# Patient Record
Sex: Female | Born: 1935 | Race: White | Hispanic: No | Smoking: Never smoker
Health system: Southern US, Community
[De-identification: ages and names within clinical notes are randomized; demographics above are authoritative.]

## PROBLEM LIST (undated history)

## (undated) DIAGNOSIS — C44300 Unspecified malignant neoplasm of skin of unspecified part of face: Secondary | ICD-10-CM

## (undated) DIAGNOSIS — K219 Gastro-esophageal reflux disease without esophagitis: Secondary | ICD-10-CM

## (undated) DIAGNOSIS — R350 Frequency of micturition: Secondary | ICD-10-CM

## (undated) DIAGNOSIS — M1990 Unspecified osteoarthritis, unspecified site: Secondary | ICD-10-CM

## (undated) DIAGNOSIS — E7800 Pure hypercholesterolemia, unspecified: Secondary | ICD-10-CM

## (undated) DIAGNOSIS — K5790 Diverticulosis of intestine, part unspecified, without perforation or abscess without bleeding: Secondary | ICD-10-CM

## (undated) DIAGNOSIS — Z5189 Encounter for other specified aftercare: Secondary | ICD-10-CM

## (undated) DIAGNOSIS — I10 Essential (primary) hypertension: Secondary | ICD-10-CM

## (undated) DIAGNOSIS — I4891 Unspecified atrial fibrillation: Principal | ICD-10-CM

## (undated) DIAGNOSIS — IMO0001 Reserved for inherently not codable concepts without codable children: Secondary | ICD-10-CM

## (undated) DIAGNOSIS — S4290XA Fracture of unspecified shoulder girdle, part unspecified, initial encounter for closed fracture: Secondary | ICD-10-CM

## (undated) DIAGNOSIS — R55 Syncope and collapse: Secondary | ICD-10-CM

## (undated) DIAGNOSIS — Z950 Presence of cardiac pacemaker: Secondary | ICD-10-CM

## (undated) DIAGNOSIS — I82409 Acute embolism and thrombosis of unspecified deep veins of unspecified lower extremity: Secondary | ICD-10-CM

## (undated) DIAGNOSIS — N3281 Overactive bladder: Secondary | ICD-10-CM

## (undated) DIAGNOSIS — N189 Chronic kidney disease, unspecified: Secondary | ICD-10-CM

## (undated) HISTORY — PX: JOINT REPLACEMENT: SHX530

## (undated) HISTORY — PX: CATARACT EXTRACTION W/ INTRAOCULAR LENS  IMPLANT, BILATERAL: SHX1307

## (undated) HISTORY — PX: TOTAL HIP ARTHROPLASTY: SHX124

## (undated) HISTORY — DX: Presence of cardiac pacemaker: Z95.0

## (undated) HISTORY — PX: APPENDECTOMY: SHX54

---

## 1983-02-04 HISTORY — PX: BLADDER SUSPENSION: SHX72

## 1984-02-04 HISTORY — PX: HERNIA REPAIR: SHX51

## 2010-02-03 HISTORY — PX: SKIN CANCER EXCISION: SHX779

## 2010-07-11 ENCOUNTER — Other Ambulatory Visit: Payer: Self-pay | Admitting: Geriatric Medicine

## 2010-07-15 ENCOUNTER — Ambulatory Visit
Admission: RE | Admit: 2010-07-15 | Discharge: 2010-07-15 | Disposition: A | Discharge status: Home / Self Care | Payer: Medicare Other | Source: Ambulatory Visit | Attending: Geriatric Medicine | Admitting: Geriatric Medicine

## 2011-02-06 DIAGNOSIS — N183 Chronic kidney disease, stage 3 unspecified: Secondary | ICD-10-CM | POA: Diagnosis not present

## 2011-02-06 DIAGNOSIS — Z0000 Encounter for general adult medical examination without abnormal findings: Secondary | ICD-10-CM | POA: Diagnosis not present

## 2011-02-06 DIAGNOSIS — R079 Chest pain, unspecified: Secondary | ICD-10-CM | POA: Diagnosis not present

## 2011-02-06 DIAGNOSIS — Z79899 Other long term (current) drug therapy: Secondary | ICD-10-CM | POA: Diagnosis not present

## 2011-02-06 DIAGNOSIS — K219 Gastro-esophageal reflux disease without esophagitis: Secondary | ICD-10-CM | POA: Diagnosis not present

## 2011-02-06 DIAGNOSIS — M25549 Pain in joints of unspecified hand: Secondary | ICD-10-CM | POA: Diagnosis not present

## 2011-02-06 DIAGNOSIS — I129 Hypertensive chronic kidney disease with stage 1 through stage 4 chronic kidney disease, or unspecified chronic kidney disease: Secondary | ICD-10-CM | POA: Diagnosis not present

## 2011-02-28 DIAGNOSIS — G5600 Carpal tunnel syndrome, unspecified upper limb: Secondary | ICD-10-CM | POA: Diagnosis not present

## 2011-02-28 DIAGNOSIS — G5620 Lesion of ulnar nerve, unspecified upper limb: Secondary | ICD-10-CM | POA: Diagnosis not present

## 2011-03-11 DIAGNOSIS — B351 Tinea unguium: Secondary | ICD-10-CM | POA: Diagnosis not present

## 2011-03-11 DIAGNOSIS — M79609 Pain in unspecified limb: Secondary | ICD-10-CM | POA: Diagnosis not present

## 2011-03-18 DIAGNOSIS — N183 Chronic kidney disease, stage 3 unspecified: Secondary | ICD-10-CM | POA: Diagnosis not present

## 2011-03-18 DIAGNOSIS — I129 Hypertensive chronic kidney disease with stage 1 through stage 4 chronic kidney disease, or unspecified chronic kidney disease: Secondary | ICD-10-CM | POA: Diagnosis not present

## 2011-03-18 DIAGNOSIS — G5600 Carpal tunnel syndrome, unspecified upper limb: Secondary | ICD-10-CM | POA: Diagnosis not present

## 2011-03-26 DIAGNOSIS — G5600 Carpal tunnel syndrome, unspecified upper limb: Secondary | ICD-10-CM | POA: Diagnosis not present

## 2011-04-10 ENCOUNTER — Other Ambulatory Visit: Payer: Self-pay | Admitting: Orthopedic Surgery

## 2011-04-15 ENCOUNTER — Encounter (HOSPITAL_BASED_OUTPATIENT_CLINIC_OR_DEPARTMENT_OTHER): Payer: Self-pay | Admitting: *Deleted

## 2011-04-15 NOTE — Progress Notes (Signed)
Pt lives [facility] [place]-does not drive. She takes care of her meds. Son to bring her dos and stay with her post op-along with granddaughter. Will need istat-ekg

## 2011-04-17 NOTE — H&P (Signed)
Julie Mcpherson is an 78 y.o. female.   Chief Complaint: c/o progressive numbness and tingling bilat. hands HPI: Julie Mcpherson is a remarkably intact 78 year-old woman.  She has an excellent sense of humor.  She presents for evaluation of bilateral hand numbness.  She had detailed electrodiagnostic studies  performed by Dr. Brien Few on February 28, 2011.  These reveal evidence of very severe bilateral carpal tunnel syndrome.    Past Medical History  Diagnosis Date  . Hypertension   . GERD (gastroesophageal reflux disease)   . Arthritis   . Urinary frequency   . Diverticular disease   . Chronic kidney disease     stage 3 renal disease    Past Surgical History  Procedure Date  . Joint replacement     both total hips=rt x2  . Hernia repair     1986  . Appendectomy   . Bladder suspension 1985    History reviewed. No pertinent family history. Social History:  reports that she has never smoked. She does not have any smokeless tobacco history on file. She reports that she does not drink alcohol or use illicit drugs.  Allergies:  Allergies  Allergen Reactions  . Codeine   . Statins     headaches  . Sulfa Antibiotics   . Tetanus Toxoids     No current facility-administered medications on file as of .   Medications Prior to Admission  Medication Sig Dispense Refill  . acetaminophen (TYLENOL) 500 MG chewable tablet Chew 500 mg by mouth every 6 (six) hours as needed.      [name] aspirin 81 MG tablet Take 81 mg by mouth daily.      . calcium carbonate (OS-CAL) 600 MG TABS Take 600 mg by mouth 2 (two) times daily with a meal.      . cholecalciferol (VITAMIN D) 1000 UNITS tablet Take 1,000 Units by mouth daily.      . fish oil-omega-3 fatty acids 1000 MG capsule Take 2 g by mouth daily.      . metoprolol tartrate (LOPRESSOR) 25 MG tablet Take 25 mg by mouth 1 day or 1 dose.      [name] omeprazole (PRILOSEC) 40 MG capsule Take 40 mg by mouth daily.      [name] oxybutynin (DITROPAN XL) 15 MG 24 hr tablet  Take 15 mg by mouth daily.        No results found for this or any previous visit (from the past 48 hour(s)).  No results found.   Pertinent items are noted in HPI.  Height 5\' 1"  (1.549 m), weight 72.576 kg (160 lb).  General appearance: alert Head: Normocephalic, without obvious abnormality Neck: supple, symmetrical, trachea midline Resp: clear to auscultation bilaterally Cardio: regular rate and rhythm, S1, S2 normal, no murmur, click, rub or gallop GI: normal findings: bowel sounds normal Extremities:.  Inspection of her hands reveals significant bilateral thenar atrophy.   She has range of motion in her fingers in flexion/extension, no sign of stenosing tenosynovitis. Pulse and cap refill are intact.  She has significant stigmata of osteoarthritis including Heberden's and Bouchard's nodes as well as shouldering of her thumb CMC joints bilaterally.  She has mild angular deformities of multiple interphalangeal joints consistent with marked osteoarthritis.    She has limitation of her shoulder motion bilaterally. She had fracture of her left shoulder approximately ten years prior and fracture of the right shoulder six years prior.     X-rays of her hands and wrist reveal  diffuse osteoarthritis affecting all of her interphalangeal joints, metacarpophalangeal joints and her thumb CMC joints.  Her carpus is well preserved bilaterally.  Dr. Lauris Poag electrodiagnostic studies are reviewed in detail. She has severe bilateral carpal tunnel syndrome with unrecordable sensory latencies bilaterally. Her ulnar nerve conduction parameters were delayed across the elbows with absent ulnar sensory nerve action potentials bilaterally.    We had a detailed discussion regarding her predicament.  She does not appear to have a clinically significant ulnar impairment despite her findings on electrodiagnostic studies.   Pulses: 2+ and symmetric Skin: Skin color, texture, turgor normal. No rashes or lesions or  normal Neurologic: Grossly normal Incision/Wound:   Assessment/Plan Impression:: Right CTS  Plan: To the OR for Right CTR. The procedure , risks and post-op course were discussed with the patient at length and she was in agreement with the plan.    DASNOIT,[name] J 04/17/2011, 1:29 PM   H&P documentation: 04/18/2011  -History and Physical Reviewed  -Patient has been re-examined  -No change in the plan of care  Cammie Sickle, MD

## 2011-04-18 ENCOUNTER — Ambulatory Visit (HOSPITAL_BASED_OUTPATIENT_CLINIC_OR_DEPARTMENT_OTHER)
Admission: RE | Admit: 2011-04-18 | Discharge: 2011-04-18 | Disposition: A | Discharge status: Home / Self Care | Payer: Medicare Other | Source: Ambulatory Visit | Attending: Orthopedic Surgery | Admitting: Orthopedic Surgery

## 2011-04-18 ENCOUNTER — Encounter (HOSPITAL_BASED_OUTPATIENT_CLINIC_OR_DEPARTMENT_OTHER)
Admission: RE | Disposition: A | Discharge status: Home / Self Care | Payer: Self-pay | Source: Ambulatory Visit | Attending: Orthopedic Surgery

## 2011-04-18 ENCOUNTER — Encounter (HOSPITAL_BASED_OUTPATIENT_CLINIC_OR_DEPARTMENT_OTHER): Payer: Self-pay | Admitting: *Deleted

## 2011-04-18 ENCOUNTER — Encounter (HOSPITAL_BASED_OUTPATIENT_CLINIC_OR_DEPARTMENT_OTHER): Payer: Self-pay | Admitting: Certified Registered Nurse Anesthetist

## 2011-04-18 ENCOUNTER — Other Ambulatory Visit: Payer: Self-pay

## 2011-04-18 ENCOUNTER — Ambulatory Visit (HOSPITAL_BASED_OUTPATIENT_CLINIC_OR_DEPARTMENT_OTHER): Payer: Medicare Other | Admitting: Certified Registered Nurse Anesthetist

## 2011-04-18 DIAGNOSIS — G5600 Carpal tunnel syndrome, unspecified upper limb: Secondary | ICD-10-CM | POA: Insufficient documentation

## 2011-04-18 DIAGNOSIS — I129 Hypertensive chronic kidney disease with stage 1 through stage 4 chronic kidney disease, or unspecified chronic kidney disease: Secondary | ICD-10-CM | POA: Diagnosis not present

## 2011-04-18 DIAGNOSIS — K219 Gastro-esophageal reflux disease without esophagitis: Secondary | ICD-10-CM | POA: Diagnosis not present

## 2011-04-18 DIAGNOSIS — N183 Chronic kidney disease, stage 3 unspecified: Secondary | ICD-10-CM | POA: Insufficient documentation

## 2011-04-18 HISTORY — DX: Chronic kidney disease, unspecified: N18.9

## 2011-04-18 HISTORY — PX: CARPAL TUNNEL RELEASE: SHX101

## 2011-04-18 HISTORY — DX: Frequency of micturition: R35.0

## 2011-04-18 HISTORY — DX: Essential (primary) hypertension: I10

## 2011-04-18 HISTORY — DX: Unspecified osteoarthritis, unspecified site: M19.90

## 2011-04-18 HISTORY — DX: Gastro-esophageal reflux disease without esophagitis: K21.9

## 2011-04-18 HISTORY — DX: Diverticulosis of intestine, part unspecified, without perforation or abscess without bleeding: K57.90

## 2011-04-18 LAB — POCT I-STAT, CHEM 8
BUN: 35 mg/dL — ABNORMAL HIGH (ref 6–23)
Calcium, Ion: 1.08 mmol/L — ABNORMAL LOW (ref 1.12–1.32)
Chloride: 111 mEq/L (ref 96–112)
Creatinine, Ser: 1.3 mg/dL — ABNORMAL HIGH (ref 0.50–1.10)
Glucose, Bld: 90 mg/dL (ref 70–99)
HCT: 36 % (ref 36.0–46.0)
Hemoglobin: 12.2 g/dL (ref 12.0–15.0)
Potassium: 4.6 mEq/L (ref 3.5–5.1)
Sodium: 139 mEq/L (ref 135–145)
TCO2: 20 mmol/L (ref 0–100)

## 2011-04-18 SURGERY — CARPAL TUNNEL RELEASE
Anesthesia: General | Site: Hand | Laterality: Right | Wound class: Clean

## 2011-04-18 MED ORDER — FENTANYL CITRATE 0.05 MG/ML IJ SOLN
INTRAMUSCULAR | Status: DC | PRN
  Administered 2011-04-18: 25 ug via INTRAVENOUS

## 2011-04-18 MED ORDER — LIDOCAINE HCL 2 % IJ SOLN
INTRAMUSCULAR | Status: DC | PRN
  Administered 2011-04-18: 4 mL

## 2011-04-18 MED ORDER — MORPHINE SULFATE 2 MG/ML IJ SOLN: 1.0000 mg | INTRAMUSCULAR | Status: DC | PRN

## 2011-04-18 MED ORDER — LACTATED RINGERS IV SOLN
INTRAVENOUS | Status: DC
Start: 2011-04-18 — End: 2011-04-18
  Administered 2011-04-18 (×2): via INTRAVENOUS

## 2011-04-18 MED ORDER — FENTANYL CITRATE 0.05 MG/ML IJ SOLN: 50.0000 ug | INTRAMUSCULAR | Status: DC | PRN

## 2011-04-18 MED ORDER — CHLORHEXIDINE GLUCONATE 4 % EX LIQD: 60.0000 mL | Freq: Once | CUTANEOUS | Status: DC

## 2011-04-18 MED ORDER — ONDANSETRON HCL 4 MG/2ML IJ SOLN
INTRAMUSCULAR | Status: DC | PRN
  Administered 2011-04-18: 4 mg via INTRAVENOUS

## 2011-04-18 MED ORDER — MIDAZOLAM HCL 2 MG/2ML IJ SOLN: 1.0000 mg | INTRAMUSCULAR | Status: DC | PRN

## 2011-04-18 MED ORDER — LIDOCAINE HCL (CARDIAC) 20 MG/ML IV SOLN
INTRAVENOUS | Status: DC | PRN
  Administered 2011-04-18: 40 mg via INTRAVENOUS

## 2011-04-18 MED ORDER — DEXAMETHASONE SODIUM PHOSPHATE 10 MG/ML IJ SOLN
INTRAMUSCULAR | Status: DC | PRN
  Administered 2011-04-18: 4 mg via INTRAVENOUS

## 2011-04-18 MED ORDER — PROPOFOL 10 MG/ML IV EMUL
INTRAVENOUS | Status: DC | PRN
  Administered 2011-04-18: 80 mg via INTRAVENOUS

## 2011-04-18 MED ORDER — TRAMADOL HCL 50 MG PO TABS: ORAL_TABLET | ORAL | Status: AC

## 2011-04-18 SURGICAL SUPPLY — 37 items
BANDAGE ADHESIVE 1X3 (GAUZE/BANDAGES/DRESSINGS) IMPLANT
BANDAGE ELASTIC 3 VELCRO ST LF (GAUZE/BANDAGES/DRESSINGS) ×2 IMPLANT
BLADE SURG 15 STRL LF DISP TIS (BLADE) ×1 IMPLANT
BLADE SURG 15 STRL SS (BLADE) ×2
BNDG CMPR 9X4 STRL LF SNTH (GAUZE/BANDAGES/DRESSINGS) ×1
BNDG ESMARK 4X9 LF (GAUZE/BANDAGES/DRESSINGS) ×1 IMPLANT
BRUSH SCRUB EZ PLAIN DRY (MISCELLANEOUS) ×2 IMPLANT
CLOTH BEACON ORANGE TIMEOUT ST (SAFETY) ×2 IMPLANT
CORDS BIPOLAR (ELECTRODE) ×1 IMPLANT
COVER MAYO STAND STRL (DRAPES) ×2 IMPLANT
COVER TABLE BACK 60X90 (DRAPES) ×2 IMPLANT
CUFF TOURNIQUET SINGLE 18IN (TOURNIQUET CUFF) ×1 IMPLANT
DECANTER SPIKE VIAL GLASS SM (MISCELLANEOUS) IMPLANT
DRAPE EXTREMITY T 121X128X90 (DRAPE) ×2 IMPLANT
DRAPE SURG 17X23 STRL (DRAPES) ×2 IMPLANT
GLOVE BIO SURGEON STRL SZ 6.5 (GLOVE) ×1 IMPLANT
GLOVE BIOGEL M STRL SZ7.5 (GLOVE) ×2 IMPLANT
GLOVE ORTHO TXT STRL SZ7.5 (GLOVE) ×2 IMPLANT
GOWN PREVENTION PLUS XLARGE (GOWN DISPOSABLE) ×3 IMPLANT
GOWN PREVENTION PLUS XXLARGE (GOWN DISPOSABLE) ×4 IMPLANT
NEEDLE 27GAX1X1/2 (NEEDLE) ×1 IMPLANT
PACK BASIN DAY SURGERY FS (CUSTOM PROCEDURE TRAY) ×2 IMPLANT
PAD CAST 3X4 CTTN HI CHSV (CAST SUPPLIES) ×1 IMPLANT
PADDING CAST ABS 4INX4YD NS (CAST SUPPLIES)
PADDING CAST ABS COTTON 4X4 ST (CAST SUPPLIES) ×1 IMPLANT
PADDING CAST COTTON 3X4 STRL (CAST SUPPLIES) ×2
SPLINT PLASTER CAST XFAST 3X15 (CAST SUPPLIES) ×5 IMPLANT
SPLINT PLASTER XTRA FASTSET 3X (CAST SUPPLIES) ×5
SPONGE GAUZE 4X4 12PLY (GAUZE/BANDAGES/DRESSINGS) ×2 IMPLANT
STOCKINETTE 4X48 STRL (DRAPES) ×2 IMPLANT
STRIP CLOSURE SKIN 1/2X4 (GAUZE/BANDAGES/DRESSINGS) ×2 IMPLANT
SUT PROLENE 3 0 PS 2 (SUTURE) ×2 IMPLANT
SYR 3ML 23GX1 SAFETY (SYRINGE) IMPLANT
SYR CONTROL 10ML LL (SYRINGE) ×1 IMPLANT
TRAY DSU PREP LF (CUSTOM PROCEDURE TRAY) ×2 IMPLANT
UNDERPAD 30X30 INCONTINENT (UNDERPADS AND DIAPERS) ×2 IMPLANT
WATER STERILE IRR 1000ML POUR (IV SOLUTION) ×2 IMPLANT

## 2011-04-18 NOTE — Brief Op Note (Signed)
04/18/2011  [id number] AM  PATIENT:  [place] y.o. female  PRE-OPERATIVE DIAGNOSIS:  Right Carpal Tunnel Syndrome  POST-OPERATIVE DIAGNOSIS:  Right Carpal Tunnel Syndrome  PROCEDURE: CARPAL TUNNEL RELEASE (Right)  SURGEON: Cammie Sickle., MD   PHYSICIAN ASSISTANT:   ASSISTANT: Kathyrn Sheriff.A-C    ANESTHESIA:   general  EBL:     BLOOD ADMINISTERED:none  DRAINS: none   LOCAL MEDICATIONS USED:  LIDOCAINE   SPECIMEN:  No Specimen  DISPOSITION OF SPECIMEN:  N/A  COUNTS:  YES  TOURNIQUET:   Total Tourniquet Time Documented: Upper Arm (Right) - 9 minutes  DICTATION: .Other Dictation: Dictation Number [id number]  PLAN OF CARE: Discharge to home after PACU  PATIENT DISPOSITION:  PACU - hemodynamically stable.

## 2011-04-18 NOTE — Anesthesia Preprocedure Evaluation (Signed)
Anesthesia Evaluation  Patient identified by MRN, date of birth, ID band Patient awake    Reviewed: Allergy & Precautions, H&P , NPO status , Patient's Chart, lab work & pertinent test results  Airway Mallampati: II TM Distance: >3 FB Neck ROM: Full    Dental   Pulmonary    Pulmonary exam normal       Cardiovascular     Neuro/Psych    GI/Hepatic GERD-  ,  Endo/Other    Renal/GU      Musculoskeletal   Abdominal   Peds  Hematology   Anesthesia Other Findings   Reproductive/Obstetrics                           Anesthesia Physical Anesthesia Plan  ASA: II  Anesthesia Plan: General   Post-op Pain Management:    Induction: Intravenous  Airway Management Planned: LMA  Additional Equipment:   Intra-op Plan:   Post-operative Plan: Extubation in OR  Informed Consent: I have reviewed the patients History and Physical, chart, labs and discussed the procedure including the risks, benefits and alternatives for the proposed anesthesia with the patient or authorized representative who has indicated his/her understanding and acceptance.     Plan Discussed with: CRNA and Surgeon  Anesthesia Plan Comments:         Anesthesia Quick Evaluation

## 2011-04-18 NOTE — Anesthesia Procedure Notes (Signed)
Procedure Name: LMA Insertion Date/Time: 04/18/2011 10:10 AM Performed by: Linde Wilensky D Pre-anesthesia Checklist: Patient identified, Emergency Drugs available, Suction available and Patient being monitored Patient Re-evaluated:Patient Re-evaluated prior to inductionOxygen Delivery Method: Circle System Utilized Preoxygenation: Pre-oxygenation with 100% oxygen Intubation Type: IV induction Ventilation: Mask ventilation without difficulty LMA: LMA inserted LMA Size: 4.0 Number of attempts: 1 Placement Confirmation: positive ETCO2 Tube secured with: Tape Dental Injury: Teeth and Oropharynx as per pre-operative assessment

## 2011-04-18 NOTE — Op Note (Signed)
Op note dictated 04/18/11  [id number]

## 2011-04-18 NOTE — Op Note (Signed)
NAME:  Julie Mcpherson, Julie Mcpherson.                ACCOUNT NO.:  MEDICAL RECORD NO.:  [id number]  LOCATION:                                 FACILITY:  PHYSICIAN:  Youlanda Mighty. Devona Holmes, M.D.      DATE OF BIRTH:  DATE OF PROCEDURE:  04/18/2011 DATE OF DISCHARGE:                              OPERATIVE REPORT   PREOPERATIVE DIAGNOSIS:  Chronic severe right carpal tunnel syndrome.  POSTOPERATIVE DIAGNOSIS:  Chronic severe right carpal tunnel syndrome.  OPERATIONS:  Release of right transverse carpal ligament.  OPERATING SURGEON:  Youlanda Mighty. Chablis Losh, M.D.  ASSISTANT:  Julian Reil, P.A.  ANESTHESIA:  General by LMA.  SUPERVISING ANESTHESIOLOGIST:  Leda Quail, M.D.  INDICATIONS:  Julie Mcpherson is a 78 year old woman referred through the courtesy of Dr. Lajean Manes.  She is in excellent condition.  She has had bilateral hand numbness.  She is referred to see Dr. Henrietta Hoover for electrodiagnostic studies which revealed bilateral carpal tunnel syndrome to a very significant degree.  She was subsequently referred for Hand Surgery consult by Dr. Felipa Eth.  Clinical examination confirmed thenar atrophy in bilateral carpal tunnel syndrome, right worse than left.  We advised to proceed with release of right Transverse carpal ligament.  Surgery and aftercare were described in detail.  She understands she will need to wait up to 5 months to see the full benefit of the procedure.  After informed consent, she was brought to the operating room at this time.  PROCEDURE:  Imani Kissell was brought to room #1 of the [facility] and placed supine position on the operating table.  Following induction of general anesthesia by LMA technique under Dr. Osborne Oman direct supervision, appropriate monitoring was placed.  Right arm was prepped with Betadine soap and solution, sterilely draped.  A pneumatic tourniquet was applied to proximal right brachium.  Following exsanguination of right arm with  Esmarch bandage, arterial tourniquet was inflated to 250 mmHg due to mild systolic hypertension.  Procedure commenced, routine surgical time-out.  Thereafter, a 2-cm incision was fashioned in line of the ring finger to palm.  Subcu tissues were carefully divided over the palmar fascia.  This split longitudinally to reveal the common sensory branch of median nerve and superficial palmar arch.  The canal was sounded with a Penfield 4 elevator followed by use of scissors to release the transverse carpal ligament of the hook at the hamate along its ulnar border.  The volar forearm fascia was released, widely opened the carpal canal and ulnar bursa.  The wound was then inspected for bleeding points which were electrocauterized bipolar current followed by repair of the skin with intradermal 3-0 Prolene suture.  Steri-Strips applied followed by sterile gauze, sterile Webril, and a volar plaster splint.  No apparent complications.  For aftercare, Ms Minotti is provided a prescription for Ultram 50 mg 1 p.o. q.4-6 hours p.r.n. pain 20 tabs without refill.  We will see her back for followup in our office in 7 days or sooner if any problems.     Youlanda Mighty Nasser Ku, M.D.     RVS/MEDQ  D:  04/18/2011  T:  04/18/2011  Job:  [id number]

## 2011-04-18 NOTE — Transfer of Care (Signed)
Immediate Anesthesia Transfer of Care Note  Patient: Julie Mcpherson  Procedure(s) Performed: Procedure(s) (LRB): CARPAL TUNNEL RELEASE (Right)  Patient Location: PACU  Anesthesia Type: General  Level of Consciousness: awake, alert , oriented and patient cooperative  Airway & Oxygen Therapy: Patient Spontanous Breathing and Patient connected to face mask oxygen  Post-op Assessment: Report given to PACU RN and Post -op Vital signs reviewed and stable  Post vital signs: Reviewed and stable  Complications: No apparent anesthesia complications

## 2011-04-18 NOTE — Anesthesia Postprocedure Evaluation (Signed)
  Anesthesia Post-op Note  Patient: Julie Mcpherson  Procedure(s) Performed: Procedure(s) (LRB): CARPAL TUNNEL RELEASE (Right)  Patient Location: PACU  Anesthesia Type: General  Level of Consciousness: awake  Airway and Oxygen Therapy: Patient Spontanous Breathing  Post-op Pain: mild  Post-op Assessment: Post-op Vital signs reviewed, Patient's Cardiovascular Status Stable, Respiratory Function Stable, Patent Airway and No signs of Nausea or vomiting  Post-op Vital Signs: stable  Complications: No apparent anesthesia complications

## 2011-04-18 NOTE — Discharge Instructions (Signed)
[facility] Instructions Hand Surgery  Wound Care: Keep your hand elevated above the level of your heart.  Do not allow it to dangle  by your side.  Keep the dressing dry and do not remove it unless your doctor advises you to do so.  He will usually change it at the time of your post-op visit.  Moving your fingers is advised to stimulate circulation but will depend on the site of your surgery.  If you have a splint applied, your doctor will advise you regarding movement.  Activity: Do not drive or operate machinery today.  Rest today and then you may return to your normal activity and work as indicated by your physician.  Diet:  Drink liquids today or eat a light diet.  You may resume a regular diet tomorrow.    General expectations: Pain for two to three days. Fingers may become slightly swollen.  Call your doctor if any of the following occur: Severe pain not relieved by pain medication. Elevated temperature. Dressing soaked with blood. Inability to move fingers. White or bluish color to fingers.[facility]  [address] [place], [place] [address] [phone]   Post Anesthesia Home Care Instructions  Activity: Get plenty of rest for the remainder of the day. A responsible adult should stay with you for 24 hours following the procedure.  For the next 24 hours, DO NOT: -Drive a car -Operate machinery -Drink alcoholic beverages -Take any medication unless instructed by your physician -Make any legal decisions or sign important papers.  Meals: Start with liquid foods such as gelatin or soup. Progress to regular foods as tolerated. Avoid greasy, spicy, heavy foods. If nausea and/or vomiting occur, drink only clear liquids until the nausea and/or vomiting subsides. Call your physician if vomiting continues.  Special Instructions/Symptoms: Your throat may feel dry or sore from the anesthesia or the breathing tube placed in your throat during surgery. If  this causes discomfort, gargle with warm salt water. The discomfort should disappear within 24 hours.   

## 2011-04-21 ENCOUNTER — Encounter (HOSPITAL_BASED_OUTPATIENT_CLINIC_OR_DEPARTMENT_OTHER): Payer: Self-pay | Admitting: Orthopedic Surgery

## 2011-05-03 ENCOUNTER — Emergency Department (HOSPITAL_COMMUNITY)
Admission: EM | Admit: 2011-05-03 | Discharge: 2011-05-03 | Disposition: A | Discharge status: Home / Self Care | Payer: Medicare Other | Source: Home / Self Care | Attending: Emergency Medicine | Admitting: Emergency Medicine

## 2011-05-03 ENCOUNTER — Encounter (HOSPITAL_COMMUNITY): Payer: Self-pay | Admitting: *Deleted

## 2011-05-03 ENCOUNTER — Other Ambulatory Visit: Payer: Self-pay

## 2011-05-03 DIAGNOSIS — Z7982 Long term (current) use of aspirin: Secondary | ICD-10-CM | POA: Insufficient documentation

## 2011-05-03 DIAGNOSIS — N183 Chronic kidney disease, stage 3 unspecified: Secondary | ICD-10-CM | POA: Insufficient documentation

## 2011-05-03 DIAGNOSIS — I129 Hypertensive chronic kidney disease with stage 1 through stage 4 chronic kidney disease, or unspecified chronic kidney disease: Secondary | ICD-10-CM | POA: Insufficient documentation

## 2011-05-03 DIAGNOSIS — R55 Syncope and collapse: Secondary | ICD-10-CM

## 2011-05-03 DIAGNOSIS — K219 Gastro-esophageal reflux disease without esophagitis: Secondary | ICD-10-CM | POA: Insufficient documentation

## 2011-05-03 DIAGNOSIS — T148XXA Other injury of unspecified body region, initial encounter: Secondary | ICD-10-CM | POA: Diagnosis not present

## 2011-05-03 DIAGNOSIS — W19XXXA Unspecified fall, initial encounter: Secondary | ICD-10-CM | POA: Insufficient documentation

## 2011-05-03 DIAGNOSIS — I10 Essential (primary) hypertension: Secondary | ICD-10-CM | POA: Insufficient documentation

## 2011-05-03 HISTORY — DX: Syncope and collapse: R55

## 2011-05-03 LAB — COMPREHENSIVE METABOLIC PANEL
ALT: 14 U/L (ref 0–35)
AST: 19 U/L (ref 0–37)
Albumin: 3.6 g/dL (ref 3.5–5.2)
Alkaline Phosphatase: 93 U/L (ref 39–117)
BUN: 36 mg/dL — ABNORMAL HIGH (ref 6–23)
CO2: 27 mEq/L (ref 19–32)
Calcium: 9.8 mg/dL (ref 8.4–10.5)
Chloride: 105 mEq/L (ref 96–112)
Creatinine, Ser: 1.44 mg/dL — ABNORMAL HIGH (ref 0.50–1.10)
GFR calc Af Amer: 35 mL/min — ABNORMAL LOW (ref >90)
GFR calc non Af Amer: 30 mL/min — ABNORMAL LOW (ref >90)
Glucose, Bld: 116 mg/dL — ABNORMAL HIGH (ref 70–99)
Potassium: 4.5 mEq/L (ref 3.5–5.1)
Sodium: 141 mEq/L (ref 135–145)
Total Bilirubin: 0.3 mg/dL (ref 0.3–1.2)
Total Protein: 6.8 g/dL (ref 6.0–8.3)

## 2011-05-03 LAB — TROPONIN I: Troponin I: <0.3 ng/mL (ref <0.30)

## 2011-05-03 LAB — DIFFERENTIAL
Basophils Absolute: 0 10*3/uL (ref 0.0–0.1)
Basophils Relative: 0 % (ref 0–1)
Eosinophils Absolute: 0.3 10*3/uL (ref 0.0–0.7)
Eosinophils Relative: 3 % (ref 0–5)
Lymphocytes Relative: 20 % (ref 12–46)
Lymphs Abs: 2 10*3/uL (ref 0.7–4.0)
Monocytes Absolute: 0.6 10*3/uL (ref 0.1–1.0)
Monocytes Relative: 7 % (ref 3–12)
Neutro Abs: 6.9 10*3/uL (ref 1.7–7.7)
Neutrophils Relative %: 70 % (ref 43–77)

## 2011-05-03 LAB — URINALYSIS, ROUTINE W REFLEX MICROSCOPIC
Bilirubin Urine: NEGATIVE
Glucose, UA: NEGATIVE mg/dL
Hgb urine dipstick: NEGATIVE
Ketones, ur: NEGATIVE mg/dL
Leukocytes, UA: NEGATIVE
Nitrite: NEGATIVE
Protein, ur: NEGATIVE mg/dL
Specific Gravity, Urine: 1.02 (ref 1.005–1.030)
Urobilinogen, UA: 0.2 mg/dL (ref 0.0–1.0)
pH: 6 (ref 5.0–8.0)

## 2011-05-03 LAB — CBC
HCT: 36.3 % (ref 36.0–46.0)
Hemoglobin: 11.9 g/dL — ABNORMAL LOW (ref 12.0–15.0)
MCH: 30 pg (ref 26.0–34.0)
MCHC: 32.8 g/dL (ref 30.0–36.0)
MCV: 91.4 fL (ref 78.0–100.0)
Platelets: 233 10*3/uL (ref 150–400)
RBC: 3.97 MIL/uL (ref 3.87–5.11)
RDW: 15.2 % (ref 11.5–15.5)
WBC: 9.8 10*3/uL (ref 4.0–10.5)

## 2011-05-03 NOTE — ED Provider Notes (Signed)
History   This chart was scribed for Julie Cower, MD by Marin Comment . The patient was seen in room APA18/APA18 and the patient's care was started at 6:54pm.   CSN: [id number]  Arrival date & time 05/03/11  [id number]   First MD Initiated Contact with Patient 05/03/11 1841      Chief Complaint  Patient presents with  . Fall    (Consider location/radiation/quality/duration/timing/severity/associated sxs/prior treatment) HPI Julie Mcpherson is a 78 y.o. female who has a h/o HTN, GERD, Arthritis, Diverticular disease and Chronic kidney disease, presents to the Emergency Department complaining of a moderate, single syncopal episode that resulted in a fall that occurred PTA. Patient arrived via EMS, with a c-collar and long spinal board. Family told EMS that the patient hit her head upon falling. Patient reports no associated symptoms. Patient denies any pain or injuries. Patient denies headache. Patient denies neck, back, abdominal and chest pain. Patient denies fever. Patient denies n/v/d. Patient denies any recent illness. Patient denies taking any blood thinners. Patient reports that she has had 3 hip replacements. Patient notes that she had a recent carpal tunnel release on 04/18/2011.   PCP: Dr. Felipa Eth   Past Medical History  Diagnosis Date  . Hypertension   . GERD (gastroesophageal reflux disease)   . Arthritis   . Urinary frequency   . Diverticular disease   . Chronic kidney disease     stage 3 renal disease    Past Surgical History  Procedure Date  . Joint replacement     both total hips=rt x2  . Hernia repair     1986  . Appendectomy   . Bladder suspension 1985  . Carpal tunnel release 04/18/2011    Procedure: CARPAL TUNNEL RELEASE;  Surgeon: Cammie Sickle., MD;  Location: [facility];  Service: Orthopedics;  Laterality: Right;    History reviewed. No pertinent family history.  History  Substance Use Topics  . Smoking status: Never  Smoker   . Smokeless tobacco: Not on file  . Alcohol Use: No    OB History    Grav Para Term Preterm Abortions TAB SAB Ect Mult Living                  Review of Systems A complete 10 system review of systems was obtained and all systems are negative except as noted in the HPI and PMH.   Allergies  Codeine; Statins; Sulfa antibiotics; and Tetanus toxoids  Home Medications   Current Outpatient Rx  Name Route Sig Dispense Refill  . ACETAMINOPHEN 500 MG PO CHEW Oral Chew 500 mg by mouth every 6 (six) hours as needed.    . ASPIRIN 81 MG PO TABS Oral Take 81 mg by mouth daily.    [name] CALCIUM CARBONATE 600 MG PO TABS Oral Take 600 mg by mouth 2 (two) times daily with a meal.    . VITAMIN D 1000 UNITS PO TABS Oral Take 1,000 Units by mouth daily.    . OMEGA-3 FATTY ACIDS 1000 MG PO CAPS Oral Take 2 g by mouth daily.    [name] METOPROLOL TARTRATE 25 MG PO TABS Oral Take 25 mg by mouth 1 day or 1 dose.    [name] OMEPRAZOLE 40 MG PO CPDR Oral Take 40 mg by mouth daily.    . OXYBUTYNIN CHLORIDE ER 15 MG PO TB24 Oral Take 15 mg by mouth daily.      BP 156/62  Pulse 71  Temp(Src)  98.2 F (36.8 C) (Oral)  Resp 28  SpO2 95%  Physical Exam  Nursing note and vitals reviewed. Constitutional: She is oriented to person, place, and time. She appears well-developed and well-nourished. No distress.  HENT:  Head: Normocephalic and atraumatic.  Right Ear: External ear normal.  Left Ear: External ear normal.  Nose: Nose normal.  Eyes: EOM are normal. Pupils are equal, round, and reactive to light.  Neck: Normal range of motion. Neck supple. No tracheal deviation present.  Cardiovascular: Normal rate, regular rhythm and normal heart sounds.   No murmur heard. Pulmonary/Chest: Effort normal and breath sounds normal. No respiratory distress.       Lungs are clear and equal.   Abdominal: Soft. Bowel sounds are normal. She exhibits no distension. There is no tenderness.  Musculoskeletal: Normal range of  motion. She exhibits no edema.       No cervical, thoracic or lumbar tenderness.   Neurological: She is alert and oriented to person, place, and time. No sensory deficit.       She moves all extremities with normal strength. She has no facial asymmetry and her speech is clear  Skin: Skin is warm and dry.  Psychiatric: She has a normal mood and affect. Her behavior is normal.    ED Course  Procedures (including critical care time)  DIAGNOSTIC STUDIES: Oxygen Saturation is 95% on room air, adequate by my interpretation.    COORDINATION OF CARE:  [id number]: Discussed planned course of treatment with the patient who is agreeable at this time. Will order labs to determine cause of syncopal episode.  2004: Discussed the lab results with the patient.   Results for orders placed during the hospital encounter of 05/03/11  CBC      Component Value Range   WBC 9.8  4.0 - 10.5 (K/uL)   RBC 3.97  3.87 - 5.11 (MIL/uL)   Hemoglobin 11.9 (*) 12.0 - 15.0 (g/dL)   HCT 36.3  36.0 - 46.0 (%)   MCV 91.4  78.0 - 100.0 (fL)   MCH 30.0  26.0 - 34.0 (pg)   MCHC 32.8  30.0 - 36.0 (g/dL)   RDW 15.2  11.5 - 15.5 (%)   Platelets 233  150 - 400 (K/uL)  DIFFERENTIAL      Component Value Range   Neutrophils Relative 70  43 - 77 (%)   Neutro Abs 6.9  1.7 - 7.7 (K/uL)   Lymphocytes Relative 20  12 - 46 (%)   Lymphs Abs 2.0  0.7 - 4.0 (K/uL)   Monocytes Relative 7  3 - 12 (%)   Monocytes Absolute 0.6  0.1 - 1.0 (K/uL)   Eosinophils Relative 3  0 - 5 (%)   Eosinophils Absolute 0.3  0.0 - 0.7 (K/uL)   Basophils Relative 0  0 - 1 (%)   Basophils Absolute 0.0  0.0 - 0.1 (K/uL)  COMPREHENSIVE METABOLIC PANEL      Component Value Range   Sodium 141  135 - 145 (mEq/L)   Potassium 4.5  3.5 - 5.1 (mEq/L)   Chloride 105  96 - 112 (mEq/L)   CO2 27  19 - 32 (mEq/L)   Glucose, Bld 116 (*) 70 - 99 (mg/dL)   BUN 36 (*) 6 - 23 (mg/dL)   Creatinine, Ser 1.44 (*) 0.50 - 1.10 (mg/dL)   Calcium 9.8  8.4 - 10.5 (mg/dL)    Total Protein 6.8  6.0 - 8.3 (g/dL)   Albumin 3.6  3.5 -  5.2 (g/dL)   AST 19  0 - 37 (U/L)   ALT 14  0 - 35 (U/L)   Alkaline Phosphatase 93  39 - 117 (U/L)   Total Bilirubin 0.3  0.3 - 1.2 (mg/dL)   GFR calc non Af Amer 30 (*) >90 (mL/min)   GFR calc Af Amer 35 (*) >90 (mL/min)  URINALYSIS, ROUTINE W REFLEX MICROSCOPIC      Component Value Range   Color, Urine YELLOW  YELLOW    APPearance CLEAR  CLEAR    Specific Gravity, Urine 1.020  1.005 - 1.030    pH 6.0  5.0 - 8.0    Glucose, UA NEGATIVE  NEGATIVE (mg/dL)   Hgb urine dipstick NEGATIVE  NEGATIVE    Bilirubin Urine NEGATIVE  NEGATIVE    Ketones, ur NEGATIVE  NEGATIVE (mg/dL)   Protein, ur NEGATIVE  NEGATIVE (mg/dL)   Urobilinogen, UA 0.2  0.0 - 1.0 (mg/dL)   Nitrite NEGATIVE  NEGATIVE    Leukocytes, UA NEGATIVE  NEGATIVE   TROPONIN I      Component Value Range   Troponin I <0.30  <0.30 (ng/mL)    No results found.   No diagnosis found.  ED ECG REPORT   Date: 05/03/2011  EKG Time: 8:02 PM  Rate: 69  Rhythm: normal sinus rhythm,    Axis: left  Intervals:none  ST&T Change: nonspecific  Narrative Interpretation: nsr with lad and nonspecific tw changes   8:21 PM I spoke with pt and family and explained findings.  Her son witnessed what had happened and corroborated her story.  Pt remains asx with nl ms and normal motor exam.  I told pt and family I think she is safe to go home, but I do not have an explanation for what occurred.  They understand an agree with plan.  They will contact pcp and arrange f/u next week.          MDM  Syncope No signs of severe injury.  No neurological deficits or change in mental status.  No signs of systemic illness   I personally performed the services described in this documentation, which was scribed in my presence. The recorded information has been reviewed and considered.       Julie Cower, MD 05/03/11 2023

## 2011-05-03 NOTE — ED Notes (Signed)
Pt arrived via ems after falling. Pt fall was witnessed by family who told ems pt fall was d/t syncopal episode and she did hit her head. Pt did not want to come to ed and reports no pain. Pt is on lsb with c collar.

## 2011-05-03 NOTE — Discharge Instructions (Signed)
Your EKG, blood tests, and urine tests do not show any significant illness followup with your doctor next week.  Call for an appointment time.  Return for worse or uncontrolled symptoms

## 2011-05-05 ENCOUNTER — Inpatient Hospital Stay (HOSPITAL_COMMUNITY)
Admission: EM | Admit: 2011-05-05 | Discharge: 2011-05-09 | DRG: 243 | Disposition: A | Discharge status: Skilled Nursing Facility | Payer: Medicare Other | Attending: Cardiology | Admitting: Cardiology

## 2011-05-05 ENCOUNTER — Other Ambulatory Visit: Payer: Self-pay

## 2011-05-05 ENCOUNTER — Emergency Department (HOSPITAL_COMMUNITY): Payer: Medicare Other

## 2011-05-05 ENCOUNTER — Encounter (HOSPITAL_COMMUNITY)
Admission: EM | Disposition: A | Discharge status: Skilled Nursing Facility | Payer: Self-pay | Source: Home / Self Care | Attending: Cardiology

## 2011-05-05 ENCOUNTER — Encounter (HOSPITAL_COMMUNITY): Payer: Self-pay

## 2011-05-05 DIAGNOSIS — I4891 Unspecified atrial fibrillation: Principal | ICD-10-CM

## 2011-05-05 DIAGNOSIS — Z96649 Presence of unspecified artificial hip joint: Secondary | ICD-10-CM

## 2011-05-05 DIAGNOSIS — Z888 Allergy status to other drugs, medicaments and biological substances status: Secondary | ICD-10-CM | POA: Diagnosis not present

## 2011-05-05 DIAGNOSIS — I498 Other specified cardiac arrhythmias: Secondary | ICD-10-CM

## 2011-05-05 DIAGNOSIS — Z7982 Long term (current) use of aspirin: Secondary | ICD-10-CM | POA: Diagnosis not present

## 2011-05-05 DIAGNOSIS — N182 Chronic kidney disease, stage 2 (mild): Secondary | ICD-10-CM | POA: Diagnosis present

## 2011-05-05 DIAGNOSIS — Z882 Allergy status to sulfonamides status: Secondary | ICD-10-CM

## 2011-05-05 DIAGNOSIS — Z79899 Other long term (current) drug therapy: Secondary | ICD-10-CM

## 2011-05-05 DIAGNOSIS — I129 Hypertensive chronic kidney disease with stage 1 through stage 4 chronic kidney disease, or unspecified chronic kidney disease: Secondary | ICD-10-CM | POA: Diagnosis present

## 2011-05-05 DIAGNOSIS — I517 Cardiomegaly: Secondary | ICD-10-CM | POA: Diagnosis not present

## 2011-05-05 DIAGNOSIS — J9819 Other pulmonary collapse: Secondary | ICD-10-CM | POA: Diagnosis not present

## 2011-05-05 DIAGNOSIS — R404 Transient alteration of awareness: Secondary | ICD-10-CM | POA: Diagnosis not present

## 2011-05-05 DIAGNOSIS — R55 Syncope and collapse: Secondary | ICD-10-CM | POA: Diagnosis not present

## 2011-05-05 DIAGNOSIS — I10 Essential (primary) hypertension: Secondary | ICD-10-CM | POA: Diagnosis not present

## 2011-05-05 DIAGNOSIS — R350 Frequency of micturition: Secondary | ICD-10-CM | POA: Diagnosis present

## 2011-05-05 DIAGNOSIS — D649 Anemia, unspecified: Secondary | ICD-10-CM | POA: Diagnosis not present

## 2011-05-05 DIAGNOSIS — Z6826 Body mass index (BMI) 26.0-26.9, adult: Secondary | ICD-10-CM

## 2011-05-05 DIAGNOSIS — M25559 Pain in unspecified hip: Secondary | ICD-10-CM | POA: Diagnosis not present

## 2011-05-05 DIAGNOSIS — K5730 Diverticulosis of large intestine without perforation or abscess without bleeding: Secondary | ICD-10-CM | POA: Diagnosis present

## 2011-05-05 DIAGNOSIS — K219 Gastro-esophageal reflux disease without esophagitis: Secondary | ICD-10-CM | POA: Diagnosis present

## 2011-05-05 DIAGNOSIS — J90 Pleural effusion, not elsewhere classified: Secondary | ICD-10-CM | POA: Diagnosis not present

## 2011-05-05 DIAGNOSIS — M1990 Unspecified osteoarthritis, unspecified site: Secondary | ICD-10-CM | POA: Diagnosis not present

## 2011-05-05 DIAGNOSIS — I495 Sick sinus syndrome: Secondary | ICD-10-CM | POA: Diagnosis present

## 2011-05-05 DIAGNOSIS — Z5189 Encounter for other specified aftercare: Secondary | ICD-10-CM | POA: Diagnosis not present

## 2011-05-05 DIAGNOSIS — J811 Chronic pulmonary edema: Secondary | ICD-10-CM | POA: Diagnosis not present

## 2011-05-05 DIAGNOSIS — Z043 Encounter for examination and observation following other accident: Secondary | ICD-10-CM | POA: Diagnosis not present

## 2011-05-05 HISTORY — PX: TEMPORARY PACEMAKER INSERTION: SHX5471

## 2011-05-05 HISTORY — DX: Overactive bladder: N32.81

## 2011-05-05 HISTORY — DX: Unspecified malignant neoplasm of skin of unspecified part of face: C44.300

## 2011-05-05 HISTORY — DX: Unspecified osteoarthritis, unspecified site: M19.90

## 2011-05-05 HISTORY — DX: Pure hypercholesterolemia, unspecified: E78.00

## 2011-05-05 HISTORY — DX: Unspecified atrial fibrillation: I48.91

## 2011-05-05 HISTORY — DX: Fracture of unspecified shoulder girdle, part unspecified, initial encounter for closed fracture: S42.90XA

## 2011-05-05 HISTORY — DX: Acute embolism and thrombosis of unspecified deep veins of unspecified lower extremity: I82.409

## 2011-05-05 HISTORY — DX: Reserved for inherently not codable concepts without codable children: IMO0001

## 2011-05-05 HISTORY — DX: Encounter for other specified aftercare: Z51.89

## 2011-05-05 HISTORY — DX: Syncope and collapse: R55

## 2011-05-05 LAB — DIFFERENTIAL
Basophils Absolute: 0 10*3/uL (ref 0.0–0.1)
Basophils Absolute: 0 10*3/uL (ref 0.0–0.1)
Basophils Relative: 0 % (ref 0–1)
Basophils Relative: 1 % (ref 0–1)
Eosinophils Absolute: 0.4 10*3/uL (ref 0.0–0.7)
Eosinophils Absolute: 0.6 10*3/uL (ref 0.0–0.7)
Eosinophils Relative: 5 % (ref 0–5)
Eosinophils Relative: 8 % — ABNORMAL HIGH (ref 0–5)
Lymphocytes Relative: 23 % (ref 12–46)
Lymphocytes Relative: 35 % (ref 12–46)
Lymphs Abs: 1.9 10*3/uL (ref 0.7–4.0)
Lymphs Abs: 2.7 10*3/uL (ref 0.7–4.0)
Monocytes Absolute: 0.8 10*3/uL (ref 0.1–1.0)
Monocytes Absolute: 0.8 10*3/uL (ref 0.1–1.0)
Monocytes Relative: 10 % (ref 3–12)
Monocytes Relative: 10 % (ref 3–12)
Neutro Abs: 5.1 10*3/uL (ref 1.7–7.7)
Neutro Abs: 3.6 10*3/uL (ref 1.7–7.7)
Neutrophils Relative %: 62 % (ref 43–77)
Neutrophils Relative %: 47 % (ref 43–77)

## 2011-05-05 LAB — COMPREHENSIVE METABOLIC PANEL
ALT: 12 U/L (ref 0–35)
ALT: 12 U/L (ref 0–35)
AST: 18 U/L (ref 0–37)
AST: 18 U/L (ref 0–37)
Albumin: 3.2 g/dL — ABNORMAL LOW (ref 3.5–5.2)
Albumin: 3.2 g/dL — ABNORMAL LOW (ref 3.5–5.2)
Alkaline Phosphatase: 84 U/L (ref 39–117)
Alkaline Phosphatase: 80 U/L (ref 39–117)
BUN: 27 mg/dL — ABNORMAL HIGH (ref 6–23)
BUN: 24 mg/dL — ABNORMAL HIGH (ref 6–23)
CO2: 23 mEq/L (ref 19–32)
CO2: 24 mEq/L (ref 19–32)
Calcium: 9.2 mg/dL (ref 8.4–10.5)
Calcium: 9.4 mg/dL (ref 8.4–10.5)
Chloride: 103 mEq/L (ref 96–112)
Chloride: 105 mEq/L (ref 96–112)
Creatinine, Ser: 1.25 mg/dL — ABNORMAL HIGH (ref 0.50–1.10)
Creatinine, Ser: 1.2 mg/dL — ABNORMAL HIGH (ref 0.50–1.10)
GFR calc Af Amer: 41 mL/min — ABNORMAL LOW (ref >90)
GFR calc Af Amer: 43 mL/min — ABNORMAL LOW (ref >90)
GFR calc non Af Amer: 35 mL/min — ABNORMAL LOW (ref >90)
GFR calc non Af Amer: 37 mL/min — ABNORMAL LOW (ref >90)
Glucose, Bld: 94 mg/dL (ref 70–99)
Glucose, Bld: 88 mg/dL (ref 70–99)
Potassium: 4.6 mEq/L (ref 3.5–5.1)
Potassium: 4.4 mEq/L (ref 3.5–5.1)
Sodium: 136 mEq/L (ref 135–145)
Sodium: 140 mEq/L (ref 135–145)
Total Bilirubin: 0.4 mg/dL (ref 0.3–1.2)
Total Bilirubin: 0.4 mg/dL (ref 0.3–1.2)
Total Protein: 6.3 g/dL (ref 6.0–8.3)
Total Protein: 6.3 g/dL (ref 6.0–8.3)

## 2011-05-05 LAB — CBC
HCT: 34.8 % — ABNORMAL LOW (ref 36.0–46.0)
HCT: 35.3 % — ABNORMAL LOW (ref 36.0–46.0)
Hemoglobin: 11.6 g/dL — ABNORMAL LOW (ref 12.0–15.0)
Hemoglobin: 11.8 g/dL — ABNORMAL LOW (ref 12.0–15.0)
MCH: 30.2 pg (ref 26.0–34.0)
MCH: 30.2 pg (ref 26.0–34.0)
MCHC: 33.3 g/dL (ref 30.0–36.0)
MCHC: 33.4 g/dL (ref 30.0–36.0)
MCV: 90.6 fL (ref 78.0–100.0)
MCV: 90.3 fL (ref 78.0–100.0)
Platelets: 194 10*3/uL (ref 150–400)
Platelets: 208 10*3/uL (ref 150–400)
RBC: 3.84 MIL/uL — ABNORMAL LOW (ref 3.87–5.11)
RBC: 3.91 MIL/uL (ref 3.87–5.11)
RDW: 15.1 % (ref 11.5–15.5)
RDW: 15.1 % (ref 11.5–15.5)
WBC: 8.2 10*3/uL (ref 4.0–10.5)
WBC: 7.7 10*3/uL (ref 4.0–10.5)

## 2011-05-05 LAB — PROTIME-INR
INR: 1.02 (ref 0.00–1.49)
Prothrombin Time: 13.6 seconds (ref 11.6–15.2)

## 2011-05-05 LAB — CARDIAC PANEL(CRET KIN+CKTOT+MB+TROPI)
CK, MB: 3.6 ng/mL (ref 0.3–4.0)
CK, MB: 3.4 ng/mL (ref 0.3–4.0)
Relative Index: INVALID (ref 0.0–2.5)
Relative Index: INVALID (ref 0.0–2.5)
Total CK: 82 U/L (ref 7–177)
Total CK: 79 U/L (ref 7–177)
Troponin I: <0.3 ng/mL (ref <0.30)
Troponin I: <0.3 ng/mL (ref <0.30)

## 2011-05-05 LAB — MAGNESIUM: Magnesium: 2 mg/dL (ref 1.5–2.5)

## 2011-05-05 LAB — APTT: aPTT: 39 seconds — ABNORMAL HIGH (ref 24–37)

## 2011-05-05 LAB — MRSA PCR SCREENING: MRSA by PCR: NEGATIVE

## 2011-05-05 LAB — TROPONIN I: Troponin I: <0.3 ng/mL (ref <0.30)

## 2011-05-05 LAB — TSH: TSH: 2.043 u[IU]/mL (ref 0.350–4.500)

## 2011-05-05 LAB — SURGICAL PCR SCREEN
MRSA, PCR: NEGATIVE
Staphylococcus aureus: NEGATIVE

## 2011-05-05 SURGERY — TEMPORARY PACEMAKER INSERTION
Anesthesia: LOCAL

## 2011-05-05 MED ORDER — PANTOPRAZOLE SODIUM 40 MG PO TBEC
40.0000 mg | DELAYED_RELEASE_TABLET | Freq: Every day | ORAL | Status: DC
  Administered 2011-05-07 – 2011-05-08 (×2): 40 mg via ORAL
  Filled 2011-05-05: qty 1

## 2011-05-05 MED ORDER — OXYBUTYNIN CHLORIDE ER 15 MG PO TB24
15.0000 mg | ORAL_TABLET | Freq: Every day | ORAL | Status: DC
  Administered 2011-05-06: 15 mg via ORAL
  Filled 2011-05-05 (×2): qty 1

## 2011-05-05 MED ORDER — ASPIRIN 81 MG PO TABS: 81.0000 mg | ORAL_TABLET | Freq: Every day | ORAL | Status: DC

## 2011-05-05 MED ORDER — LOSARTAN POTASSIUM 50 MG PO TABS
50.0000 mg | ORAL_TABLET | Freq: Every day | ORAL | Status: DC
  Administered 2011-05-07 – 2011-05-09 (×3): 50 mg via ORAL
  Filled 2011-05-05 (×5): qty 1

## 2011-05-05 MED ORDER — CHLORHEXIDINE GLUCONATE 4 % EX LIQD
CUTANEOUS | Status: DC
  Administered 2011-05-05 – 2011-05-06 (×2): via TOPICAL
  Filled 2011-05-05 (×2): qty 15

## 2011-05-05 MED ORDER — METOPROLOL TARTRATE 1 MG/ML IV SOLN: 2.5000 mg | Freq: Four times a day (QID) | INTRAVENOUS | Status: DC | PRN

## 2011-05-05 MED ORDER — ASPIRIN 81 MG PO CHEW
81.0000 mg | CHEWABLE_TABLET | Freq: Every day | ORAL | Status: DC
  Administered 2011-05-06 – 2011-05-09 (×4): 81 mg via ORAL
  Filled 2011-05-05 (×4): qty 1

## 2011-05-05 MED ORDER — AMIODARONE HCL 200 MG PO TABS
200.0000 mg | ORAL_TABLET | Freq: Every day | ORAL | Status: DC
  Administered 2011-05-05 – 2011-05-07 (×3): 200 mg via ORAL
  Filled 2011-05-05 (×3): qty 1

## 2011-05-05 MED ORDER — ACETAMINOPHEN 325 MG PO TABS
650.0000 mg | ORAL_TABLET | ORAL | Status: DC | PRN
  Administered 2011-05-06: 650 mg via ORAL
  Filled 2011-05-05: qty 2

## 2011-05-05 MED ORDER — VITAMIN D3 25 MCG (1000 UNIT) PO TABS
1000.0000 [IU] | ORAL_TABLET | Freq: Every day | ORAL | Status: DC
  Administered 2011-05-05 – 2011-05-09 (×5): 1000 [IU] via ORAL
  Filled 2011-05-05 (×5): qty 1

## 2011-05-05 MED ORDER — ASPIRIN EC 81 MG PO TBEC: 81.0000 mg | DELAYED_RELEASE_TABLET | Freq: Every day | ORAL | Status: DC

## 2011-05-05 MED ORDER — HEPARIN BOLUS VIA INFUSION
2500.0000 [IU] | Freq: Once | INTRAVENOUS | Status: AC
  Administered 2011-05-05: 2500 [IU] via INTRAVENOUS

## 2011-05-05 MED ORDER — ONDANSETRON HCL 4 MG/2ML IJ SOLN: 4.0000 mg | Freq: Four times a day (QID) | INTRAMUSCULAR | Status: DC | PRN

## 2011-05-05 MED ORDER — ACETAMINOPHEN 80 MG PO CHEW: 500.0000 mg | CHEWABLE_TABLET | Freq: Four times a day (QID) | ORAL | Status: DC | PRN

## 2011-05-05 MED ORDER — ACETAMINOPHEN 80 MG PO CHEW
500.0000 mg | CHEWABLE_TABLET | Freq: Four times a day (QID) | ORAL | Status: DC | PRN
  Filled 2011-05-05: qty 7

## 2011-05-05 MED ORDER — HEPARIN (PORCINE) IN NACL 100-0.45 UNIT/ML-% IJ SOLN
950.0000 [IU]/h | INTRAMUSCULAR | Status: DC
  Administered 2011-05-05: 800 [IU]/h via INTRAVENOUS
  Administered 2011-05-06: 950 [IU]/h via INTRAVENOUS
  Filled 2011-05-05: qty 250

## 2011-05-05 MED ORDER — NITROGLYCERIN 0.4 MG SL SUBL: 0.4000 mg | SUBLINGUAL_TABLET | SUBLINGUAL | Status: DC | PRN

## 2011-05-05 MED ORDER — OMEGA-3 FATTY ACIDS 1000 MG PO CAPS: 2.0000 g | ORAL_CAPSULE | Freq: Every day | ORAL | Status: DC

## 2011-05-05 MED ORDER — SODIUM CHLORIDE 0.9 % IV SOLN
INTRAVENOUS | Status: DC
  Administered 2011-05-05: 50 mL/h via INTRAVENOUS

## 2011-05-05 MED ORDER — ACETAMINOPHEN 325 MG PO TABS: 650.0000 mg | ORAL_TABLET | ORAL | Status: DC | PRN

## 2011-05-05 MED ORDER — CALCIUM CARBONATE 1250 (500 CA) MG PO TABS
1.0000 | ORAL_TABLET | Freq: Two times a day (BID) | ORAL | Status: DC
  Administered 2011-05-05 – 2011-05-06 (×2): 500 mg via ORAL
  Filled 2011-05-05 (×4): qty 1

## 2011-05-05 MED ORDER — OMEGA-3-ACID ETHYL ESTERS 1 G PO CAPS
2.0000 g | ORAL_CAPSULE | Freq: Every day | ORAL | Status: DC
  Administered 2011-05-06: 2 g via ORAL
  Filled 2011-05-05: qty 2

## 2011-05-05 MED ORDER — CALCIUM CARBONATE 600 MG PO TABS
600.0000 mg | ORAL_TABLET | Freq: Two times a day (BID) | ORAL | Status: DC
  Filled 2011-05-05 (×2): qty 1

## 2011-05-05 NOTE — Consult Note (Addendum)
ELECTROPHYSIOLOGY CONSULT NOTE   Patient ID: Julie Mcpherson MRN: [id number] DOB/AGE: [birth year] 76 y.o.  Admit date: 05/05/2011  Primary Physician   Mathews Argyle, MD, MD Primary Cardiologist   [facility] Reason for Consultation   Bradycardia  [id number] Julie Mcpherson is a 78 y.o. female with no history of CAD or arrhythmia.  She had carpal tunnel surgery on 3/15 without any mention of an arrhythmia. She came to the ER on 3/30 after a fall and syncope but her ECG from that visit was SR. Today, she came to the ER complaining of dizziness and a lightheaded feeling. She was in atrial fib and having pauses up to 4 seconds. Dr Terrence Dupont requested EP to see for possible PPM in am.   Pt has never had palpitations. She is not aware of her irregular heart rate. She is having pauses up to 5 seconds at least every 10 minutes over the last 30 minutes. These are post-termination pauses but she does not hold sinus rhythm for more than a few seconds. During these pauses, Julie Mcpherson feels presyncope and feels weak. She has not had chest pain. Until her syncopal episode 2 days ago, she had no recent medical problems.       Past Medical History  Diagnosis Date  . Hypertension   . GERD (gastroesophageal reflux disease)   . Arthritis   . Urinary frequency   . Diverticular disease   . Chronic kidney disease     stage 3 renal disease     Past Surgical History  Procedure Date  . Joint replacement     both total hips=rt x2  . Hernia repair     1986  . Appendectomy   . Bladder suspension 1985  . Carpal tunnel release 04/18/2011    Procedure: CARPAL TUNNEL RELEASE;  Surgeon: Cammie Sickle., MD;  Location: [place];  Service: Orthopedics;  Laterality: Right;    Allergies  Allergen Reactions  . Codeine   . Statins     headaches  . Sulfa Antibiotics   . Tetanus Toxoids     I have reviewed the patient's current medications. Scheduled:   . amiodarone  200 mg Oral Daily    . aspirin  81 mg Oral Daily  . calcium carbonate  1 tablet Oral BID WC  . cholecalciferol  1,000 Units Oral Daily  . heparin  2,500 Units Intravenous Once  . losartan  50 mg Oral Daily  . omega-3 acid ethyl esters  2 g Oral Daily  . oxybutynin  15 mg Oral Daily  . pantoprazole  40 mg Oral Q1200   Continuous:   . heparin     Prescriptions prior to admission  Medication Sig Dispense Refill  . acetaminophen (TYLENOL) 500 MG  Chew 500 mg by mouth every 6 (six) hours  PRN      . aspirin 81 MG tablet Take 81 mg by mouth daily.      . calcium carbonate (OS-CAL) 600 MG TABS Take 600 mg by mouth 2 (two) times daily with a meal.      . cholecalciferol  1000 UNITS tablet Take 1,000 Units by mouth daily.      . fish oil-omega-3 fatty acids 1000 MG  Take 2 g by mouth daily.      . metoprolol tartrate  25 MG tablet T ake 25 mg by mouth 1 day or 1 dose.      [name] omeprazole (PRILOSEC) 40 MG capsule Take 40 mg  by mouth daily.      [name] oxybutynin 15 MG 24 hr tablet Take 15 mg by mouth daily.         History   Social History  . Marital Status: Widowed    Spouse Name: N/A    Number of Children: N/A  . Years of Education: N/A   Occupational History  . Retired   Social History Main Topics  . Smoking status: Never Smoker   . Smokeless tobacco: Not on file  . Alcohol Use: No  . Drug Use: No  . Sexually Active:    Other Topics Concern  . Not on file   Social History Narrative  . She lives in the independent-living section of a facility. She has family nearby. She does her own ADL's and does well but uses a walker all the time because of orthopedic problems.     Family history. Both parents are deceased and neither parent nor siblings have/had any pacemakers.  ROS: She has chronic Julie aches/pains. Her right leg is shorter than the left after a second THR. She has no recent illnesses, fevers or chills. She has some DOE that has not changed recently. Full 14 point review of systems complete and  found to be negative unless listed  above  Physical Exam: Blood pressure 101/85, pulse 103, temperature 98.3 F (36.8 C), temperature source Oral, resp. rate 18, height 5\' 4"  (1.626 m), weight 151 lb 6.4 oz (68.675 kg), SpO2 93.00%.   General: Well developed, well nourished, elderly female in no acute distress Head: Eyes PERRLA, No xanthomas.   Normocephalic and atraumatic, oropharynx without edema or exudate. Dentition poor Lungs: Clear bilaterally to auscultation except for a few basilar rales Heart:  Heart irregular rate and rhythm with S1, S2; ?soft murmur. pulses are 2+ & equal all 4 extrem.   Neck: No carotid bruit. No lymphadenopathy.  JVD not elevated. Abdomen: Bowel sounds present, abdomen soft and non-tender without masses or hernias noted. Msk:  No spine or cva tenderness. Normal strength and tone for age, no joint deformities or effusions. Extremities: No clubbing or cyanosis.  No edema.  Neuro: Alert and oriented X 3. No focal deficits noted. Psych:  Good affect, responds appropriately Skin: No rashes or lesions noted.  Labs:   Lab Results  Component Value Date   WBC 8.2 05/05/2011   HGB 11.6* 05/05/2011   HCT 34.8* 05/05/2011   MCV 90.6 05/05/2011   PLT 194 05/05/2011    Basename 05/05/11 1428  INR 1.02    Lab 05/05/11 1232  NA 136  K 4.6  CL 103  CO2 23  BUN 27*  CREATININE 1.25*  CALCIUM 9.2  PROT 6.3  BILITOT 0.4  ALKPHOS 84  ALT 12  AST 18  GLUCOSE 94    Basename 05/05/11 1237 05/03/11 1859  CKTOTAL -- --  CKMB -- --  TROPONINI <0.30 <0.30    Radiology:  Dg Hip Complete Right 05/05/2011  *RADIOLOGY REPORT*  Clinical Data: Fall 2 years ago.  Right hip pain worse with standing.  RIGHT HIP - COMPLETE 2+ VIEW  Comparison: None.  Findings: Prior right total hip replacement.  Asymmetric lucency at the femoral tip component may represent loosening.  Limited evaluation of the pelvis. A P pelvis was not obtained (as per order from the emergency room staff per  radiology technologist).  IMPRESSION: Prior right total hip replacement.  Asymmetric lucency at the femoral tip component may represent loosening. No fracture surrounding the prosthesis.  Limited evaluation of the pelvis. A P pelvis was not obtained (as per order from the emergency room staff per radiology technologist).  If there is concern for pelvic injury, then follow- up imaging will be necessary.  Original Report Authenticated By: Doug Sou, M.D.   Dg Chest Port 1 View 05/05/2011  *RADIOLOGY REPORT*  Clinical Data: Fall 2 days ago.  PORTABLE CHEST - 1 VIEW  Comparison: 02/06/2011.  Findings: Scoliosis.  Cardiomegaly. Asymmetric pulmonary vascular congestion/mild pulmonary edema.  No gross pneumothorax.  Mildly tortuous aorta.  IMPRESSION: Cardiomegaly.  Asymmetric pulmonary vascular congestion/mild pulmonary edema.  Original Report Authenticated By: Doug Sou, M.D.    EKG:  Atrial fib, RVR with post-termination pauses > 5 seconds at times, otherwise, heart rate > 100  ASSESSMENT AND PLAN:   The patient was seen today by Caryl Comes, the patient evaluated and the data reviewed.   1. Symptomatic bradycardia - Dr Caryl Comes is aware of the patient and will see her. Upon review of the data, he feels a temporary pacer wire is indicated with multiple symptomatic pauses. Dr Terrence Dupont and the cath lab/EP lab are aware and will arrange. Her metoprolol was at a low dose but has been held.  2. Atrial fib - amiodarone ordered, but with pauses, consider holding this until temp/perm pacing is available.  3. Anticoag - Chads score is 2;  CHADS-VASc is >4  anticoag per Dr Terrence Dupont;  For now stop heparin Signed: Rosaria Ferries 05/05/2011, 3:43 PM Co-Sign MD    Agree Julie assessment as above;  i was opening to get away with a single chamber device given age--will anticipate passive leads given that.  She will certainly need rate control although maybe not amio as she is unaware of her tachycardia--how is it  contributing to her dizziness--rate or rhythm  The benefits and risks were reviewed including but not limited to death,  perforation, infection, lead dislodgement and device malfunction.  The patient understands agrees and is willing to proceed.  Jolyn Nap MD

## 2011-05-05 NOTE — ED Notes (Signed)
Admitting MD at bedside.

## 2011-05-05 NOTE — H&P (Signed)
Julie Mcpherson is an 78 y.o. female.   Chief Complaint: Near-syncope HPI: Patient is 78 year old female with past medical history significant for hypertension and GERD degenerative joint disease history of diverticular disease chronic kidney disease history of overactive bladder came to the ER complaining of dizziness lightheadedness her and was noted to be in A. fib with moderate went response patient was also noted to have frequent pauses up to 4 seconds. Patient was seen at) Hospital on Saturday because of similar symptoms and had syncopal episode her workup was negative patient was noted to be in sinus rhythm at that time. Patient denies any chest pain nausea or vomiting diaphoresis. Her patient is on Lopressor 25 mg one tablet daily. In ER patient was noted to be in A. fib with frequent pauses as above. Patient presently denies any chest pain nausea vomiting diaphoresis denies any dizziness lightheadedness. Denies history of PND orthopnea leg swelling. Patient denies any thyroid problems in the past denies any recent weight loss or weight gain.  Past Medical History  Diagnosis Date  . Hypertension   . GERD (gastroesophageal reflux disease)   . Arthritis   . Urinary frequency   . Diverticular disease   . Chronic kidney disease     stage 3 renal disease    Past Surgical History  Procedure Date  . Joint replacement     both total hips=rt x2  . Hernia repair     1986  . Appendectomy   . Bladder suspension 1985  . Carpal tunnel release 04/18/2011    Procedure: CARPAL TUNNEL RELEASE;  Surgeon: Cammie Sickle., MD;  Location: [place];  Service: Orthopedics;  Laterality: Right;    History reviewed. No pertinent family history. Social History:  reports that she has never smoked. She does not have any smokeless tobacco history on file. She reports that she does not drink alcohol or use illicit drugs.  Allergies:  Allergies  Allergen Reactions  . Codeine   . Statins      headaches  . Sulfa Antibiotics   . Tetanus Toxoids     No current facility-administered medications on file as of 05/05/2011.   Medications Prior to Admission  Medication Sig Dispense Refill  . acetaminophen (TYLENOL) 500 MG chewable tablet Chew 500 mg by mouth every 6 (six) hours as needed.      [name] aspirin 81 MG tablet Take 81 mg by mouth daily.      . calcium carbonate (OS-CAL) 600 MG TABS Take 600 mg by mouth 2 (two) times daily with a meal.      . cholecalciferol (VITAMIN D) 1000 UNITS tablet Take 1,000 Units by mouth daily.      . fish oil-omega-3 fatty acids 1000 MG capsule Take 2 g by mouth daily.      . metoprolol tartrate (LOPRESSOR) 25 MG tablet Take 25 mg by mouth 1 day or 1 dose.      [name] omeprazole (PRILOSEC) 40 MG capsule Take 40 mg by mouth daily.      [name] oxybutynin (DITROPAN XL) 15 MG 24 hr tablet Take 15 mg by mouth daily.        Results for orders placed during the hospital encounter of 05/05/11 (from the past 48 hour(s))  CBC     Status: Abnormal   Collection Time   05/05/11 12:32 PM      Component Value Range Comment   WBC 8.2  4.0 - 10.5 (K/uL)    RBC 3.84 (*)  3.87 - 5.11 (MIL/uL)    Hemoglobin 11.6 (*) 12.0 - 15.0 (g/dL)    HCT 34.8 (*) 36.0 - 46.0 (%)    MCV 90.6  78.0 - 100.0 (fL)    MCH 30.2  26.0 - 34.0 (pg)    MCHC 33.3  30.0 - 36.0 (g/dL)    RDW 15.1  11.5 - 15.5 (%)    Platelets 194  150 - 400 (K/uL)   DIFFERENTIAL     Status: Normal   Collection Time   05/05/11 12:32 PM      Component Value Range Comment   Neutrophils Relative 62  43 - 77 (%)    Neutro Abs 5.1  1.7 - 7.7 (K/uL)    Lymphocytes Relative 23  12 - 46 (%)    Lymphs Abs 1.9  0.7 - 4.0 (K/uL)    Monocytes Relative 10  3 - 12 (%)    Monocytes Absolute 0.8  0.1 - 1.0 (K/uL)    Eosinophils Relative 5  0 - 5 (%)    Eosinophils Absolute 0.4  0.0 - 0.7 (K/uL)    Basophils Relative 0  0 - 1 (%)    Basophils Absolute 0.0  0.0 - 0.1 (K/uL)   COMPREHENSIVE METABOLIC PANEL     Status: Abnormal    Collection Time   05/05/11 12:32 PM      Component Value Range Comment   Sodium 136  135 - 145 (mEq/L)    Potassium 4.6  3.5 - 5.1 (mEq/L)    Chloride 103  96 - 112 (mEq/L)    CO2 23  19 - 32 (mEq/L)    Glucose, Bld 94  70 - 99 (mg/dL)    BUN 27 (*) 6 - 23 (mg/dL)    Creatinine, Ser 1.25 (*) 0.50 - 1.10 (mg/dL)    Calcium 9.2  8.4 - 10.5 (mg/dL)    Total Protein 6.3  6.0 - 8.3 (g/dL)    Albumin 3.2 (*) 3.5 - 5.2 (g/dL)    AST 18  0 - 37 (U/L)    ALT 12  0 - 35 (U/L)    Alkaline Phosphatase 84  39 - 117 (U/L)    Total Bilirubin 0.4  0.3 - 1.2 (mg/dL)    GFR calc non Af Amer 35 (*) >90 (mL/min)    GFR calc Af Amer 41 (*) >90 (mL/min)   TROPONIN I     Status: Normal   Collection Time   05/05/11 12:37 PM      Component Value Range Comment   Troponin I <0.30  <0.30 (ng/mL)    Dg Hip Complete Right  05/05/2011  *RADIOLOGY REPORT*  Clinical Data: Fall 2 years ago.  Right hip pain worse with standing.  RIGHT HIP - COMPLETE 2+ VIEW  Comparison: None.  Findings: Prior right total hip replacement.  Asymmetric lucency at the femoral tip component may represent loosening.  Limited evaluation of the pelvis. A P pelvis was not obtained (as per order from the emergency room staff per radiology technologist).  IMPRESSION: Prior right total hip replacement.  Asymmetric lucency at the femoral tip component may represent loosening. No fracture surrounding the prosthesis.  Limited evaluation of the pelvis. A P pelvis was not obtained (as per order from the emergency room staff per radiology technologist).  If there is concern for pelvic injury, then follow- up imaging will be necessary.  Original Report Authenticated By: Doug Sou, M.D.   Dg Chest Port 1 View  05/05/2011  *  RADIOLOGY REPORT*  Clinical Data: Fall 2 days ago.  PORTABLE CHEST - 1 VIEW  Comparison: 02/06/2011.  Findings: Scoliosis.  Cardiomegaly. Asymmetric pulmonary vascular congestion/mild pulmonary edema.  No gross pneumothorax.  Mildly  tortuous aorta.  IMPRESSION: Cardiomegaly.  Asymmetric pulmonary vascular congestion/mild pulmonary edema.  Original Report Authenticated By: Doug Sou, M.D.    Review of Systems  Constitutional: Negative for fever and chills.  HENT: Negative for neck pain.   Eyes: Negative for blurred vision, double vision and photophobia.  Respiratory: Negative for cough, hemoptysis and sputum production.   Cardiovascular: Negative for chest pain, palpitations, orthopnea and claudication.  Gastrointestinal: Negative for heartburn, nausea, vomiting and abdominal pain.  Musculoskeletal: Negative for myalgias.  Neurological: Positive for dizziness. Negative for headaches.    Blood pressure 165/95, pulse 114, temperature 98.1 F (36.7 C), temperature source Oral, resp. rate 22, SpO2 97.00%. Physical Exam  Constitutional: She is oriented to person, place, and time. She appears well-developed.  HENT:  Head: Normocephalic and atraumatic.  Nose: Nose normal.  Mouth/Throat: No oropharyngeal exudate.  Eyes: Conjunctivae are normal. Left eye exhibits no discharge. No scleral icterus.  Neck: Normal range of motion. Neck supple. No JVD present. No tracheal deviation present. No thyromegaly present.  Cardiovascular:       Irregularly irregular tachycardic S1-S2 is soft there is no S3 gallop there is soft systolic murmur at left lower sternal border  Respiratory:       Decreased breath sounds at bases with no rhonchi or rales no wheezing  GI: She exhibits no distension. There is no tenderness. There is no rebound and no guarding.  Musculoskeletal: She exhibits no edema and no tenderness.  Lymphadenopathy:    She has no cervical adenopathy.  Neurological: She is oriented to person, place, and time. She has normal reflexes.     Assessment/Plan New-onset A. fib with moderate went for response Sick sinus syndrome with pauses Status post recent syncope Uncontrolled hypertension Degenerative joint  disease History of diverticular disease Chronic kidney disease stage II GERD Plan As per orders We'll start her on low-dose amiodarone and start on heparin patient is not a candidate for chronic anticoagulation hopefully amiodarone we'll keep her in sinus rhythm We'll get EP consultation for permanent pacemaker.   Clent Demark 05/05/2011, 2:24 PM

## 2011-05-05 NOTE — ED Notes (Signed)
Pt c/o near syncopal episode today. Pt found to be afib on monitor with no Hx. HR ranged from 140-180. Pt in NAD at this time, a&ox4. 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

## 2011-05-05 NOTE — Progress Notes (Signed)
Patient transferred to cath lab for temporary pacemaker placement. Patient accompanied by Charge Nurse to cath lab. Zoll placed on patient for monitoring during transport.

## 2011-05-05 NOTE — ED Notes (Signed)
MD at bedside. Pt HR ranging from 95-155.

## 2011-05-05 NOTE — ED Notes (Signed)
Pt states she has syncopal episode last Saturday and was seen at [facility] and d/c. Pt states that today she began feeling similar to Saturday and checked BP and it was high for her. Pt states that she also wants her right hip to be xrayed as it has been hurting since her fall and it was not attended to at [facility].

## 2011-05-05 NOTE — ED Notes (Signed)
Pt unable to go to xray at this time, portable xrays ordered per dr. Vanita Panda.

## 2011-05-05 NOTE — Progress Notes (Signed)
ANTICOAGULATION CONSULT NOTE - Initial Consult  Pharmacy Consult for Heparin Indication: atrial fibrillation  Allergies  Allergen Reactions  . Codeine   . Statins     headaches  . Sulfa Antibiotics   . Tetanus Toxoids     Patient Measurements:   Height = 61 inches Weight = 72.6 kg Heparin Dosing Weight: 63 kg  Vital Signs: Temp: 98.1 F (36.7 C) (04/01 1202) Temp src: Oral (04/01 1202) BP: 165/95 mmHg (04/01 1419) Pulse Rate: 114  (04/01 1419)  Labs:  Basename 05/05/11 1237 05/05/11 1232 05/03/11 1859  HGB -- 11.6* 11.9*  HCT -- 34.8* 36.3  PLT -- 194 233  APTT -- -- --  LABPROT -- -- --  INR -- -- --  HEPARINUNFRC -- -- --  CREATININE -- 1.25* 1.44*  CKTOTAL -- -- --  CKMB -- -- --  TROPONINI <0.30 -- <0.30   CrCl ~ 30 ml/min  Medical History: Past Medical History  Diagnosis Date  . Hypertension   . GERD (gastroesophageal reflux disease)   . Arthritis   . Urinary frequency   . Diverticular disease   . Chronic kidney disease     stage 3 renal disease    Medications:  Tylenol, ASA, Os-Cal, Vit D, Fish Oil, Metoprolol, Omeprazole, Oxybutynin  Assessment: 78 yo F presented to ED with dizziness and lightheadedness and was found to be in A.fib.  Pt also seen in ED 3/30 with syncopal episode (in NSR at that time).  Plan to start heparin per pharmacy protocol.  Per MD, pt is not a candidate for long-term anti-coagulation.  Goal of Therapy:  Heparin level 0.3-0.7 units/ml   Plan:  Baseline PT/INR and PTT to be drawn (already ordered by MD). Heparin 2500 unit IV bolus x 1. Heparin infusion at 800 units/hr. Heparin level in 8 hours. Heparin level and CBC daily while on heparin.   [employer], Pharm.D., BCPS Clinical Pharmacist Pager [phone] 05/05/2011 2:42 PM

## 2011-05-05 NOTE — ED Notes (Signed)
Monitor showing multiple pauses in HR lasting 4 seconds or longer. Showed strips to MD. Zoll Pads placed on pt.

## 2011-05-05 NOTE — CV Procedure (Signed)
Temporary pacemaker insertion note dictated on 05/05/2011 dictation number is Wilder

## 2011-05-05 NOTE — ED Provider Notes (Signed)
History     CSN: [id number]  Arrival date & time 05/05/11  1154   First MD Initiated Contact with Patient 05/05/11 1154      Chief Complaint  Patient presents with  . Near Syncope  . Atrial Fibrillation     HPI The patient presents after an episode of near-syncope.  Notably, the patient was seen 2 days ago at an affiliated facility for a syncopal event.  She notes that at that time she had a non-prodromal syncopal event, with minor fall.  She did not have any subsequent complaints or pain.  She was discharged and the next day, yesterday, noticed that her right hip hurts.  This pain has been persistent since that time. Today, just prior to arrival the patient had a near syncopal episode.  She denies any prodrome.  She notes a few moments of lightheadedness.  Following resolution of that she called 911.  On arrival the patient has no lightheadedness, no complaints.  Per EMS the patient was in atrial fibrillation, with a sinus pause of approximately 4 seconds en route. Past Medical History  Diagnosis Date  . Hypertension   . GERD (gastroesophageal reflux disease)   . Arthritis   . Urinary frequency   . Diverticular disease   . Chronic kidney disease     stage 3 renal disease    Past Surgical History  Procedure Date  . Joint replacement     both total hips=rt x2  . Hernia repair     1986  . Appendectomy   . Bladder suspension 1985  . Carpal tunnel release 04/18/2011    Procedure: CARPAL TUNNEL RELEASE;  Surgeon: Cammie Sickle., MD;  Location: [place];  Service: Orthopedics;  Laterality: Right;    History reviewed. No pertinent family history.  History  Substance Use Topics  . Smoking status: Never Smoker   . Smokeless tobacco: Not on file  . Alcohol Use: No    OB History    Grav Para Term Preterm Abortions TAB SAB Ect Mult Living                  Review of Systems  All other systems reviewed and are negative.    Allergies  Codeine;  Statins; Sulfa antibiotics; and Tetanus toxoids  Home Medications   Current Outpatient Rx  Name Route Sig Dispense Refill  . ACETAMINOPHEN 500 MG PO CHEW Oral Chew 500 mg by mouth every 6 (six) hours as needed.    . ASPIRIN 81 MG PO TABS Oral Take 81 mg by mouth daily.    [name] CALCIUM CARBONATE 600 MG PO TABS Oral Take 600 mg by mouth 2 (two) times daily with a meal.    . VITAMIN D 1000 UNITS PO TABS Oral Take 1,000 Units by mouth daily.    . OMEGA-3 FATTY ACIDS 1000 MG PO CAPS Oral Take 2 g by mouth daily.    [name] METOPROLOL TARTRATE 25 MG PO TABS Oral Take 25 mg by mouth 1 day or 1 dose.    [name] OMEPRAZOLE 40 MG PO CPDR Oral Take 40 mg by mouth daily.    . OXYBUTYNIN CHLORIDE ER 15 MG PO TB24 Oral Take 15 mg by mouth daily.      BP 151/93  Pulse 111  Temp(Src) 98.1 F (36.7 C) (Oral)  Resp 22  SpO2 100%  Physical Exam  Nursing note and vitals reviewed. Constitutional: She is oriented to person, place, and time. She appears well-developed  and well-nourished.  HENT:  Head: Normocephalic and atraumatic.  Eyes: Conjunctivae are normal.  Cardiovascular: An irregularly irregular rhythm present. Tachycardia present.   Pulmonary/Chest: Effort normal and breath sounds normal. No stridor.  Abdominal: Soft. She exhibits no distension.  Musculoskeletal:       Tenderness about the right lateral without instability.  Patient has 5/5 strength in both lower extremities  Neurological: She is alert and oriented to person, place, and time. No cranial nerve deficit. She exhibits normal muscle tone. Coordination normal.  Skin: Skin is warm and dry.  Psychiatric: She has a normal mood and affect.    ED Course  Procedures (including critical care time)   Labs Reviewed  CBC  DIFFERENTIAL  COMPREHENSIVE METABOLIC PANEL  TROPONIN I   No results found.   No diagnosis found. Cardiac monitor 112 atrial fibrillation abnormal  Pulse oximetry 97% room air normal  Date: 05/05/2011  Rate: 115   Rhythm: atrial fibrillation  QRS Axis: left  Intervals: normal  ST/T Wave abnormalities: nonspecific ST/T changes  Conduction Disutrbances:left anterior fascicular block  Narrative Interpretation:   Old EKG Reviewed: changes noted Different from 2d ago.  ABNORMAL ECG  Rhythm Strip after arrival w 4sec pause.   MDM  This generally well 78 year old female presents following both a syncopal and a near syncopal episode.  On exam the patient is in no distress.  The patient's initial exam is notable for atrial fibrillation with a heart rate in the 120s, with occasional sinus pauses of up to 4 seconds.  Given this new dysrhythmia there is concern for progression.  The patient was admitted to the cardiology service for further evaluation and management.  The patient also had mild right hip pain which was evaluated with an x-ray.  X-ray does not demonstrate fracture, though there are some questionable loosening of her prior replacement.  This was discussed with the patient's family.  Additional orthopedic investigation will occur following the addressing of her heart condition.      Carmin Muskrat, MD 05/05/11 1434

## 2011-05-05 NOTE — Progress Notes (Signed)
Patient in atrial fib with pauses 3-5 seconds. Physician assistant at bedside to evaluate patient. MD request to hold medications and food until decision made continued plan of care. Will continue to monitor patient.

## 2011-05-06 ENCOUNTER — Encounter (HOSPITAL_COMMUNITY)
Admission: EM | Disposition: A | Discharge status: Skilled Nursing Facility | Payer: Self-pay | Source: Home / Self Care | Attending: Cardiology

## 2011-05-06 ENCOUNTER — Encounter: Payer: Self-pay | Admitting: Internal Medicine

## 2011-05-06 DIAGNOSIS — I10 Essential (primary) hypertension: Secondary | ICD-10-CM | POA: Diagnosis not present

## 2011-05-06 DIAGNOSIS — I495 Sick sinus syndrome: Secondary | ICD-10-CM | POA: Diagnosis not present

## 2011-05-06 DIAGNOSIS — M1990 Unspecified osteoarthritis, unspecified site: Secondary | ICD-10-CM | POA: Diagnosis not present

## 2011-05-06 DIAGNOSIS — R55 Syncope and collapse: Secondary | ICD-10-CM | POA: Diagnosis not present

## 2011-05-06 DIAGNOSIS — I4891 Unspecified atrial fibrillation: Secondary | ICD-10-CM | POA: Diagnosis not present

## 2011-05-06 DIAGNOSIS — J9819 Other pulmonary collapse: Secondary | ICD-10-CM | POA: Diagnosis not present

## 2011-05-06 DIAGNOSIS — I498 Other specified cardiac arrhythmias: Secondary | ICD-10-CM

## 2011-05-06 HISTORY — PX: PERMANENT PACEMAKER INSERTION: SHX5480

## 2011-05-06 LAB — CBC
HCT: 31.2 % — ABNORMAL LOW (ref 36.0–46.0)
Hemoglobin: 10.3 g/dL — ABNORMAL LOW (ref 12.0–15.0)
MCH: 30.2 pg (ref 26.0–34.0)
MCHC: 33 g/dL (ref 30.0–36.0)
MCV: 91.5 fL (ref 78.0–100.0)
Platelets: 183 10*3/uL (ref 150–400)
RBC: 3.41 MIL/uL — ABNORMAL LOW (ref 3.87–5.11)
RDW: 15.3 % (ref 11.5–15.5)
WBC: 6.8 10*3/uL (ref 4.0–10.5)

## 2011-05-06 LAB — LIPID PANEL
Cholesterol: 190 mg/dL (ref 0–200)
HDL: 59 mg/dL (ref >39)
LDL Cholesterol: 117 mg/dL — ABNORMAL HIGH (ref 0–99)
Total CHOL/HDL Ratio: 3.2 RATIO
Triglycerides: 72 mg/dL (ref <150)
VLDL: 14 mg/dL (ref 0–40)

## 2011-05-06 LAB — BASIC METABOLIC PANEL
BUN: 22 mg/dL (ref 6–23)
CO2: 25 mEq/L (ref 19–32)
Calcium: 8.7 mg/dL (ref 8.4–10.5)
Chloride: 107 mEq/L (ref 96–112)
Creatinine, Ser: 1.15 mg/dL — ABNORMAL HIGH (ref 0.50–1.10)
GFR calc Af Amer: 45 mL/min — ABNORMAL LOW (ref >90)
GFR calc non Af Amer: 39 mL/min — ABNORMAL LOW (ref >90)
Glucose, Bld: 93 mg/dL (ref 70–99)
Potassium: 4.2 mEq/L (ref 3.5–5.1)
Sodium: 140 mEq/L (ref 135–145)

## 2011-05-06 LAB — CARDIAC PANEL(CRET KIN+CKTOT+MB+TROPI)
CK, MB: 2.4 ng/mL (ref 0.3–4.0)
Relative Index: INVALID (ref 0.0–2.5)
Total CK: 50 U/L (ref 7–177)
Troponin I: <0.3 ng/mL (ref <0.30)

## 2011-05-06 LAB — PROTIME-INR
INR: 1.11 (ref 0.00–1.49)
Prothrombin Time: 14.5 seconds (ref 11.6–15.2)

## 2011-05-06 LAB — HEPARIN LEVEL (UNFRACTIONATED): Heparin Unfractionated: 0.16 IU/mL — ABNORMAL LOW (ref 0.30–0.70)

## 2011-05-06 SURGERY — PERMANENT PACEMAKER INSERTION
Anesthesia: LOCAL

## 2011-05-06 MED ORDER — CEFAZOLIN SODIUM 1-5 GM-% IV SOLN
INTRAVENOUS | Status: AC
  Administered 2011-05-06: 1 g via INTRAVENOUS
  Filled 2011-05-06: qty 100

## 2011-05-06 MED ORDER — ONDANSETRON HCL 4 MG/2ML IJ SOLN: 4.0000 mg | Freq: Four times a day (QID) | INTRAMUSCULAR | Status: DC | PRN

## 2011-05-06 MED ORDER — ACETAMINOPHEN 325 MG PO TABS
325.0000 mg | ORAL_TABLET | ORAL | Status: DC | PRN
  Administered 2011-05-06 – 2011-05-08 (×4): 650 mg via ORAL
  Filled 2011-05-06 (×4): qty 2

## 2011-05-06 MED ORDER — METOPROLOL SUCCINATE ER 25 MG PO TB24
25.0000 mg | ORAL_TABLET | Freq: Every day | ORAL | Status: DC
  Administered 2011-05-07 – 2011-05-09 (×3): 25 mg via ORAL
  Filled 2011-05-06 (×3): qty 1

## 2011-05-06 MED ORDER — SODIUM CHLORIDE 0.9 % IV SOLN: INTRAVENOUS | Status: AC

## 2011-05-06 MED ORDER — HEPARIN (PORCINE) IN NACL 2-0.9 UNIT/ML-% IJ SOLN
INTRAMUSCULAR | Status: AC
  Filled 2011-05-06: qty 1000

## 2011-05-06 MED ORDER — SODIUM CHLORIDE 0.9 % IR SOLN
Freq: Once | Status: DC
  Filled 2011-05-06: qty 2

## 2011-05-06 MED ORDER — HEPARIN BOLUS VIA INFUSION
2000.0000 [IU] | Freq: Once | INTRAVENOUS | Status: AC
  Administered 2011-05-06: 2000 [IU] via INTRAVENOUS
  Filled 2011-05-06: qty 2000

## 2011-05-06 MED ORDER — LIDOCAINE HCL (PF) 1 % IJ SOLN
INTRAMUSCULAR | Status: AC
  Filled 2011-05-06: qty 60

## 2011-05-06 MED ORDER — MIDAZOLAM HCL 5 MG/5ML IJ SOLN
INTRAMUSCULAR | Status: AC
  Filled 2011-05-06: qty 5

## 2011-05-06 MED ORDER — CEFAZOLIN SODIUM 1-5 GM-% IV SOLN
1.0000 g | Freq: Four times a day (QID) | INTRAVENOUS | Status: AC
  Administered 2011-05-06 – 2011-05-07 (×3): 1 g via INTRAVENOUS
  Filled 2011-05-06 (×5): qty 50

## 2011-05-06 MED ORDER — FENTANYL CITRATE 0.05 MG/ML IJ SOLN
INTRAMUSCULAR | Status: AC
  Filled 2011-05-06: qty 2

## 2011-05-06 NOTE — CV Procedure (Signed)
dictationh  Number  [id number]  Julie Mcpherson

## 2011-05-06 NOTE — Progress Notes (Signed)
Right femoral venous sheath removed at 2100 per md order, pressure held 25minutes manual with complete hemostasis achieved, patient tolerated well, site level "0". Will continue to monitor. Debbe Odea

## 2011-05-06 NOTE — Progress Notes (Signed)
Subjective:  Patient denies any chest pain or shortness of breath. Converted back to sinus rhythm and remains in sinus rhythm. Scheduled for permanent pacemaker later this morning  Objective:  Vital Signs in the last 24 hours: Temp:  [97.3 F (36.3 C)-98.3 F (36.8 C)] 97.9 F (36.6 C) (04/02 0700) Pulse Rate:  [47-134] 59  (04/02 0700) Resp:  [11-26] 16  (04/02 0700) BP: (81-165)/(41-100) 99/51 mmHg (04/02 0700) SpO2:  [93 %-100 %] 100 % (04/02 0700) Weight:  [68.675 kg (151 lb 6.4 oz)-71 kg (156 lb 8.4 oz)] 71 kg (156 lb 8.4 oz) (04/02 0400)  Intake/Output from previous day: 04/01 0701 - 04/02 0700 In: 1124.5 [P.O.:420; I.V.:704.5] Out: 400 [Urine:400] Intake/Output from this shift:    Physical Exam: Neck: no adenopathy, no carotid bruit, no JVD and supple, symmetrical, trachea midline Lungs: clear to auscultation bilaterally Heart: regular rate and rhythm, S1, S2 normal and Soft systolic murmur noted Abdomen: soft, non-tender; bowel sounds normal; no masses,  no organomegaly Extremities: extremities normal, atraumatic, no cyanosis or edema Right groin dressing dry Lab Results:  Basename 05/06/11 0345 05/05/11 1725  WBC 6.8 7.7  HGB 10.3* 11.8*  PLT 183 208    Basename 05/06/11 0345 05/05/11 1725  NA 140 140  K 4.2 4.4  CL 107 105  CO2 25 24  GLUCOSE 93 88  BUN 22 24*  CREATININE 1.15* 1.20*    Basename 05/05/11 2134 05/05/11 1724  TROPONINI <0.30 <0.30   Hepatic Function Panel  Basename 05/05/11 1725  PROT 6.3  ALBUMIN 3.2*  AST 18  ALT 12  ALKPHOS 80  BILITOT 0.4  BILIDIR --  IBILI --    Basename 05/06/11 0345  CHOL 190   No results found for this basename: PROTIME in the last 72 hours  Imaging: Imaging results have been reviewed and Dg Hip Complete Right  05/05/2011  *RADIOLOGY REPORT*  Clinical Data: Fall 2 years ago.  Right hip pain worse with standing.  RIGHT HIP - COMPLETE 2+ VIEW  Comparison: None.  Findings: Prior right total hip  replacement.  Asymmetric lucency at the femoral tip component may represent loosening.  Limited evaluation of the pelvis. A P pelvis was not obtained (as per order from the emergency room staff per radiology technologist).  IMPRESSION: Prior right total hip replacement.  Asymmetric lucency at the femoral tip component may represent loosening. No fracture surrounding the prosthesis.  Limited evaluation of the pelvis. A P pelvis was not obtained (as per order from the emergency room staff per radiology technologist).  If there is concern for pelvic injury, then follow- up imaging will be necessary.  Original Report Authenticated By: Doug Sou, M.D.   Dg Chest Port 1 View  05/05/2011  *RADIOLOGY REPORT*  Clinical Data: Fall 2 days ago.  PORTABLE CHEST - 1 VIEW  Comparison: 02/06/2011.  Findings: Scoliosis.  Cardiomegaly. Asymmetric pulmonary vascular congestion/mild pulmonary edema.  No gross pneumothorax.  Mildly tortuous aorta.  IMPRESSION: Cardiomegaly.  Asymmetric pulmonary vascular congestion/mild pulmonary edema.  Original Report Authenticated By: Doug Sou, M.D.    Cardiac Studies:  Assessment/Plan:  Status post paroxysmal atrial fib with pauses between 4-5 seconds. Status post temporary transvenous pacemaker 6 sinus syndrome Status post recent syncope Hypertension Degenerative joint disease History of diverticular disease Anemia Chronic kidney disease stage II GERD Plan Schedule for permanent pacemaker later today.  LOS: 1 day    Julie Mcpherson 05/06/2011, 9:49 AM

## 2011-05-06 NOTE — Progress Notes (Signed)
  Echocardiogram 2D Echocardiogram has been performed.  Caylea Foronda, Orlena Sheldon 05/06/2011, 4:46 PM

## 2011-05-06 NOTE — Progress Notes (Addendum)
Clinical Social Work Department BRIEF PSYCHOSOCIAL ASSESSMENT 05/06/2011  Patient:  Julie Mcpherson, Julie Mcpherson     Account Number:  [id number]     Admit date:  05/05/2011  Clinical Social Worker:  Otilio Saber  Date/Time:  05/06/2011 09:11 AM  Referred by:  RN  Date Referred:  05/05/2011 Referred for  Other - See comment   Other Referral:   Patient was admitted from facility   Interview type:  Patient Other interview type:    PSYCHOSOCIAL DATA Living Status:  FACILITY Admitted from facility:  [address] Level of care:  Independent Living Primary support name:  [name] Primary support relationship to patient:  CHILD, ADULT Degree of support available:   supportive    CURRENT CONCERNS Current Concerns  None Noted   Other Concerns:    SOCIAL WORK ASSESSMENT / PLAN Clinical Social Worker received referral for patient being admitted from a facility. The patient is a resident of [facility] in their Independent Level of care. Patient reported that she previously passed out Saturday and was sent to [facility] and was then sent back to her facility. Yesterday she reports that she started to feel similar symptoms and called the nurse at the facility and they sent her here to the hospital. CSW discussed with patient the possibility of potentially needing skilled level for rehab. CSW called and left a message for [place] at the facility. At this time, there is no need for social work intervention and CSW is signing off. If new needs arise, please re-consult social work again.   Assessment/plan status:  Will continue to follow Other assessment/ plan:   05/07/10 CSW will continue to follow for disposition needs. Patient may need SNF vs. [facility] services if stable enough to return to independent living.   14:24pm Patient will need short term rehab and is able to transition to the rehab section of [facility] facility. CSW completed FL2 for MD's signature and  placed in shadow chart.  Information/referral to community resources:    PATIENT'S/FAMILY'S RESPONSE TO PLAN OF CARE: Patient is hopeful for return back to [facility] at the [facility]. Patient is agreeable if she will need skilled level for rehab.

## 2011-05-06 NOTE — Progress Notes (Signed)
ANTICOAGULATION CONSULT NOTE - Follow Up Consult  Pharmacy Consult for heparin Indication: atrial fibrillation  Allergies  Allergen Reactions  . Codeine Other (See Comments)    "can't sleep; I climb the walls"  . Statins Other (See Comments)    "ocular migraine headaches"  . Sulfa Antibiotics Anaphylaxis, Itching and Rash  . Tetanus Toxoids Other (See Comments)    "think it started turning red around where I got shot; they gave me small doses and I did fine"    Patient Measurements: Height: 5\' 4"  (162.6 cm) Weight: 156 lb 8.4 oz (71 kg) IBW/kg (Calculated) : 54.7  Heparin Dosing Weight: 63 kg  Vital Signs: Temp: 97.8 F (36.6 C) (04/02 0400) Temp src: Oral (04/02 0400) BP: 117/56 mmHg (04/02 0600) Pulse Rate: 60  (04/02 0600)  Labs:  Basename 05/06/11 0345 05/05/11 2134 05/05/11 1725 05/05/11 1724 05/05/11 1428 05/05/11 1237 05/05/11 1232  HGB 10.3* -- 11.8* -- -- -- --  HCT 31.2* -- 35.3* -- -- -- 34.8*  PLT 183 -- 208 -- -- -- 194  APTT -- -- 39* -- -- -- --  LABPROT 14.5 -- -- -- 13.6 -- --  INR 1.11 -- -- -- 1.02 -- --  HEPARINUNFRC 0.16* -- -- -- -- -- --  CREATININE 1.15* -- 1.20* -- -- -- 1.25*  CKTOTAL -- 79 -- 82 -- -- --  CKMB -- 3.4 -- 3.6 -- -- --  TROPONINI -- <0.30 -- <0.30 -- <0.30 --   Estimated Creatinine Clearance: 28.3 ml/min (by C-G formula based on Cr of 1.15).   Medications:  Scheduled:    . amiodarone  200 mg Oral Daily  . aspirin  81 mg Oral Daily  . calcium carbonate  1 tablet Oral BID WC  . chlorhexidine   Topical Custom  . cholecalciferol  1,000 Units Oral Daily  . heparin  2,500 Units Intravenous Once  . losartan  50 mg Oral Daily  . omega-3 acid ethyl esters  2 g Oral Daily  . oxybutynin  15 mg Oral Daily  . pantoprazole  40 mg Oral Q1200  . DISCONTD: aspirin EC  81 mg Oral Daily  . DISCONTD: aspirin  81 mg Oral Daily  . DISCONTD: calcium carbonate  600 mg Oral BID WC  . DISCONTD: fish oil-omega-3 fatty acids  2 g Oral Daily     Infusions:    . sodium chloride 50 mL/hr (05/05/11 1900)  . heparin 800 Units/hr (05/05/11 2027)    Assessment: 78 yo F presented to ED with dizziness and lightheadedness and was found to be in A.fib. Pt also seen in ED 3/30 with syncopal episode (in NSR at that time).  Heparin level currently less than goal with heparin at 800 units/hr.  Drip running without complications, per RN.  CBC slightly decreased, will follow.  Goal of Therapy:  Heparin level 0.3-0.7 units/ml   Plan:  1.  Rebolus heparin 2000 units IV x1 2.  Increase drip to 950 units/hr 3.  Check heparin level 8h after rate change. 4.  Continue daily heparin level and CBC. 5.  Communicated changes with RN, Mylo Red L. Amada Jupiter, PharmD, [place] Clinical Pharmacist Pager: [phone] 05/06/2011 6:43 AM

## 2011-05-06 NOTE — Cardiovascular Report (Signed)
NAMEKRISANNA, KRATKY NO.:  [id number]  MEDICAL RECORD NO.:  [id number]  LOCATION:  2906                         FACILITY:  [facility]  PHYSICIAN:  Allegra Lai. Terrence Dupont, M.D. DATE OF BIRTH:  Jul 01, 1913  DATE OF PROCEDURE:  05/05/2011 DATE OF DISCHARGE:                           CARDIAC CATHETERIZATION   PROCEDURE:  Insertion of temporary transvenous pacemaker via right femoral venous approach.  INDICATION FOR THE PROCEDURE:  Ms. Gunion is 78 year old female with past medical history significant for hypertension, GERD, degenerative joint disease, history of diverticular disease, chronic kidney disease stage IV, history of overactive bladder.  She came to the ER complaining of dizziness, lightheadedness, and was noted to have new-onset AFib with moderate ventricular response, and frequent pauses up to 4 seconds associated with dizziness.  The patient was seen at [facility] on Saturday because of similar symptoms and had syncopal episode.  EKG workup was negative.  EKG was noted to be in sinus rhythm at that time and no pauses were noted.  The patient denies any chest pain, nausea, vomiting, diaphoresis.  While on the floor today, the patient had frequent pauses more than 4 seconds associated with dizziness.  The patient is scheduled for permanent pacemaker tomorrow, but due to significant symptomatic pauses, the patient was brought to the Cath Lab for elective temporary transvenous pacemaker.  PROCEDURE:  After obtaining the informed consent, right groin was prepped and draped in usual fashion.  Xylocaine 1% was used for local anesthesia in the right groin.  With the help of thin wall needle, a 6- French venous sheath was placed without difficulty.  Next, the sheath was aspirated and flushed.  Next, a 5-French balloon tipped pacer was advanced under fluoroscopic guidance up to the RV apex without difficulty with good capture at 2 millivolts.  Pacemaker was  kept at rate of 60 with 5 millivolts.  The patient tolerated the procedure well. There were no complications.  The patient will be transferred to CCU, will start her on heparin overnight and the patient is scheduled for permanent pacemaker by Dr. Caryl Comes in a.m. for permanent pacemaker.     Allegra Lai. Terrence Dupont, M.D.     MNH/MEDQ  D:  05/05/2011  T:  05/06/2011  Job:  [id number]

## 2011-05-07 ENCOUNTER — Inpatient Hospital Stay (HOSPITAL_COMMUNITY): Payer: Medicare Other

## 2011-05-07 DIAGNOSIS — J811 Chronic pulmonary edema: Secondary | ICD-10-CM | POA: Diagnosis not present

## 2011-05-07 DIAGNOSIS — I495 Sick sinus syndrome: Secondary | ICD-10-CM | POA: Diagnosis not present

## 2011-05-07 DIAGNOSIS — I4891 Unspecified atrial fibrillation: Secondary | ICD-10-CM | POA: Diagnosis not present

## 2011-05-07 DIAGNOSIS — I498 Other specified cardiac arrhythmias: Secondary | ICD-10-CM | POA: Diagnosis not present

## 2011-05-07 DIAGNOSIS — J9819 Other pulmonary collapse: Secondary | ICD-10-CM | POA: Diagnosis not present

## 2011-05-07 DIAGNOSIS — M1990 Unspecified osteoarthritis, unspecified site: Secondary | ICD-10-CM | POA: Diagnosis not present

## 2011-05-07 DIAGNOSIS — J90 Pleural effusion, not elsewhere classified: Secondary | ICD-10-CM | POA: Diagnosis not present

## 2011-05-07 DIAGNOSIS — I10 Essential (primary) hypertension: Secondary | ICD-10-CM | POA: Diagnosis not present

## 2011-05-07 DIAGNOSIS — R55 Syncope and collapse: Secondary | ICD-10-CM | POA: Diagnosis not present

## 2011-05-07 NOTE — Progress Notes (Signed)
UR Completed. Simmons, Zeah Germano F [phone]  

## 2011-05-07 NOTE — Progress Notes (Signed)
   ELECTROPHYSIOLOGY ROUNDING NOTE    Patient Name: Julie Mcpherson Date of Encounter: 05-07-2011    SUBJECTIVE: Patient feels well.  Minimal incisional soreness.  No chest pain or shortness of breath.  Status post dual chamber pacemaker implant yesterday.    TELEMETRY: Reviewed telemetry pt atrial pacing with intrinsic ventricular conduction Filed Vitals:   05/07/11 0500 05/07/11 0600 05/07/11 0645 05/07/11 0700  BP: 135/57 147/56  152/54  Pulse: 59 60 66 59  Temp:      TempSrc:      Resp: 15 15 18 15   Height:      Weight:      SpO2: 100% 100% 100% 100%    Intake/Output Summary (Last 24 hours) at 05/07/11 0743 Last data filed at 05/07/11 0600  Gross per 24 hour  Intake    610 ml  Output   2175 ml  Net  -1565 ml    LABS: Basic Metabolic Panel:  Basename 05/06/11 0345 05/05/11 1725  NA 140 140  K 4.2 4.4  CL 107 105  CO2 25 24  GLUCOSE 93 88  BUN 22 24*  CREATININE 1.15* 1.20*  CALCIUM 8.7 9.4  MG -- 2.0  PHOS -- --   Liver Function Tests:  [facility] 05/05/11 1725 05/05/11 1232  AST 18 18  ALT 12 12  ALKPHOS 80 84  BILITOT 0.4 0.4  PROT 6.3 6.3  ALBUMIN 3.2* 3.2*   CBC:  Basename 05/06/11 0345 05/05/11 1725 05/05/11 1232  WBC 6.8 7.7 --  NEUTROABS -- 3.6 5.1  HGB 10.3* 11.8* --  HCT 31.2* 35.3* --  MCV 91.5 90.3 --  PLT 183 208 --   Cardiac Enzymes:  Basename 05/06/11 0907 05/05/11 2134 05/05/11 1724  CKTOTAL 50 79 82  CKMB 2.4 3.4 3.6  CKMBINDEX -- -- --  TROPONINI <0.30 <0.30 <0.30   Fasting Lipid Panel:  Basename 05/06/11 0345  CHOL 190  HDL 59  LDLCALC 117*  TRIG 72  CHOLHDL 3.2  LDLDIRECT --   Thyroid Function Tests:  Basename 05/05/11 1725  TSH 2.043  T4TOTAL --  T3FREE --  THYROIDAB --    Radiology/Studies:   Final result pending, unable to visualize RV lead  PHYSICAL EXAM BP 145/47  Pulse 64  Temp(Src) 97.7 F (36.5 C) (Oral)  Resp 17  Ht 5\' 4"  (1.626 m)  Wt 156 lb 8.4 oz (71 kg)  BMI 26.87 kg/m2  SpO2  99%  Well developed and nourished in no acute distress HENT normal Neck supple with JVP-flat Clear  Left chest without hematoma or ecchymosis.  Regular rate and rhythm, no murmurs or gallops Abd-soft with active BS No Clubbing cyanosis edema Skin-warm and dry A & Oriented  Grossly normal sensory and motor function   DEVICE INTERROGATION: Device interrogated by industry.  Lead values including impedence, sensing, threshold within normal values.    We will arrange wound check at [facility] in 10 days.  Wound care/arm mobility reviewed with patient.  Discharge to home F/u for wound and progrmamming  Long term rate control and anticoagulation  Discussed with DR MH yesterday

## 2011-05-07 NOTE — Progress Notes (Signed)
Subjective:  Patient denies any chest pain or shortness of breath No further episodes of atrial fibrillation Atrial paced rhythm on the monitor. Would not favor chronic anticoagulation in view of her advanced age and high risk of fall will discuss with family  Objective:  Vital Signs in the last 24 hours: Temp:  [97.1 F (36.2 C)-98 F (36.7 C)] 98 F (36.7 C) (04/03 1135) Pulse Rate:  [59-81] 81  (04/03 0900) Resp:  [12-21] 20  (04/03 1135) BP: (101-152)/(35-58) 120/48 mmHg (04/03 1135) SpO2:  [92 %-100 %] 97 % (04/03 1135)  Intake/Output from previous day: 04/02 0701 - 04/03 0700 In: 610 [I.V.:460; IV Piggyback:150] Out: 2175 [Urine:2175] Intake/Output from this shift: Total I/O In: 480 [P.O.:480] Out: -   Physical Exam: Neck: no adenopathy, no carotid bruit, no JVD and supple, symmetrical, trachea midline Lungs: clear to auscultation bilaterally Heart: regular rate and rhythm, S1, S2 normal and Soft systolic murmur noted Abdomen: soft, non-tender; bowel sounds normal; no masses,  no organomegaly Extremities: extremities normal, atraumatic, no cyanosis or edema Pacemaker site okay Lab Results:  Basename 05/06/11 0345 05/05/11 1725  WBC 6.8 7.7  HGB 10.3* 11.8*  PLT 183 208    Basename 05/06/11 0345 05/05/11 1725  NA 140 140  K 4.2 4.4  CL 107 105  CO2 25 24  GLUCOSE 93 88  BUN 22 24*  CREATININE 1.15* 1.20*    Basename 05/06/11 0907 05/05/11 2134  TROPONINI <0.30 <0.30   Hepatic Function Panel  Basename 05/05/11 1725  PROT 6.3  ALBUMIN 3.2*  AST 18  ALT 12  ALKPHOS 80  BILITOT 0.4  BILIDIR --  IBILI --    Basename 05/06/11 0345  CHOL 190   No results found for this basename: PROTIME in the last 72 hours  Imaging: Imaging results have been reviewed and Dg Hip Complete Right  05/05/2011  *RADIOLOGY REPORT*  Clinical Data: Fall 2 years ago.  Right hip pain worse with standing.  RIGHT HIP - COMPLETE 2+ VIEW  Comparison: None.  Findings: Prior  right total hip replacement.  Asymmetric lucency at the femoral tip component may represent loosening.  Limited evaluation of the pelvis. A P pelvis was not obtained (as per order from the emergency room staff per radiology technologist).  IMPRESSION: Prior right total hip replacement.  Asymmetric lucency at the femoral tip component may represent loosening. No fracture surrounding the prosthesis.  Limited evaluation of the pelvis. A P pelvis was not obtained (as per order from the emergency room staff per radiology technologist).  If there is concern for pelvic injury, then follow- up imaging will be necessary.  Original Report Authenticated By: Doug Sou, M.D.   Dg Chest Portable 1 View  05/07/2011  *RADIOLOGY REPORT*  Clinical Data: Status post pacemaker insertion.  PORTABLE CHEST - 1 VIEW  Comparison: Chest x-ray 05/05/2011.  Findings: Compared to the prior examination, there is been interval placement of a left sided pacemaker device, with one lead tip projecting over the expected location of the right atrium, and the other lead tip not well visualized (it appears to extend into the right ventricle).  Lung volumes remain low.  There are bibasilar opacities (left much greater than right), which are favored to represent areas of atelectasis (underlying air space consolidation is difficult to exclude).  Blunting of the costophrenic sulci bilaterally suggest the presence of small bilateral pleural effusions.  Mild cardiomegaly is again noted, likely accentuated by patient rotation to the left which also  distorts mediastinal contours.  There is some cephalization of the pulmonary vasculature and indistinctness of interstitial markings with diffuse peribronchial cuffing, likely in part to reflect underlying pulmonary edema (superimposed infection such as bronchitis is difficult to exclude).  No pneumothorax.  Severe calcifications of the mitral annulus.  IMPRESSION: 1.  Interval placement of pacemaker, as  above, without pneumothorax or other acute complicating features. 2.  Persistent low lung volumes with slight worsening bibasilar opacities favored to reflect worsening bibasilar subsegmental atelectasis. 3.  Mild - moderate pulmonary edema slightly increased. 4.  Given the extent of peribronchial cuffing, clinical correlation for signs and symptoms of acute bronchitis is recommended. 5.  Small bilateral pleural effusions.  Original Report Authenticated By: Etheleen Mayhew, M.D.   Dg Chest Port 1 View  05/05/2011  *RADIOLOGY REPORT*  Clinical Data: Fall 2 days ago.  PORTABLE CHEST - 1 VIEW  Comparison: 02/06/2011.  Findings: Scoliosis.  Cardiomegaly. Asymmetric pulmonary vascular congestion/mild pulmonary edema.  No gross pneumothorax.  Mildly tortuous aorta.  IMPRESSION: Cardiomegaly.  Asymmetric pulmonary vascular congestion/mild pulmonary edema.  Original Report Authenticated By: Doug Sou, M.D.    Cardiac Studies:  Assessment/Plan:  Status post paroxysmal recurrent A. fib Status post symptomatic bradycardia status post permanent pacemaker Status post recent syncope Hypertension Degenerative joint disease History of diverticular disease Anemia Chronic kidney disease stage II GERD Plan Social service for discharge planning OT PT consult   LOS: 2 days    Julie Mcpherson 05/07/2011, 1:01 PM

## 2011-05-07 NOTE — Progress Notes (Addendum)
Patient is able to be received in the SNF portion of [facility] for short term rehab before going back to independent living. Patient will need a 3 day qualifying hospital stay and CSW notified the MD. CSW completed FL2 for MD's signature and placed in shadow chart. Assessment note has been updated. CSW will continue to follow.    Clinical Social Work Department CLINICAL SOCIAL WORK PLACEMENT NOTE 05/07/2011  Patient:  Julie Mcpherson, Julie Mcpherson  Account Number:  [id number] Admit date:  05/05/2011  Clinical Social Worker:  Otilio Saber  Date/time:  05/07/2011 02:16 PM  Clinical Social Work is seeking post-discharge placement for this patient at the following level of care:   [place]   (*CSW will update this form in Epic as items are completed)   05/07/2011  Patient/family provided with [facility] Department of Clinical Social Work's list of facilities offering this level of care within the geographic area requested by the patient (or if unable, by the patient's family).  05/07/2011  Patient/family informed of their freedom to choose among providers that offer the needed level of care, that participate in Medicare, Medicaid or managed care program needed by the patient, have an available bed and are willing to accept the patient.    Patient/family informed of [facility]' ownership interest in [facility], as well as of the fact that they are under no obligation to receive care at this facility.  PASARR submitted to EDS on 05/06/2011 PASARR number received from EDS on 05/06/2011  FL2 transmitted to all facilities in geographic area requested by pt/family on  05/07/2011 FL2 transmitted to all facilities within larger geographic area on   Patient informed that his/her managed care company has contracts with or will negotiate with  certain facilities, including the following:     Patient/family informed of bed offers received:   Patient chooses bed  at [facility] Physician recommends and patient chooses bed at    Patient to be transferred to [facility] on  05/09/11 Patient to be transferred to facility by Private Vehicle of family member.  The following physician request were entered in Epic:   Additional Comments: Patient is from [facility] facility in the Independent Level. Per Claiborne Billings in admissions, the patient can go to the rehab portion of the facility when medically ready.

## 2011-05-07 NOTE — Op Note (Signed)
NAMEALAINE, OMORI NO.:  [id number]  MEDICAL RECORD NO.:  [id number]  LOCATION:  2906                         FACILITY:  [facility]  PHYSICIAN:  Deboraha Sprang, MD, FACCDATE OF BIRTH:  1913/05/01  DATE OF PROCEDURE: DATE OF DISCHARGE:                              OPERATIVE REPORT   PREOPERATIVE DIAGNOSIS:  Atrial fibrillation with post-termination pauses.  POSTOPERATIVE DIAGNOSIS:  Atrial fibrillation with post-termination pauses.  PROCEDURE:  Dual-chamber pacemaker implantation.  Following obtaining informed consent, the patient was brought to the electrophysiology laboratory and placed on the fluoroscopic table in a supine position.  After routine prep and drape of the left upper chest, lidocaine was infiltrated in prepectoral subclavicular region.  An incision was made and carried down to the layer of the prepectoral fascia using electrocautery and sharp dissection.  A pocket was formed similarly.  Hemostasis was obtained.  Thereafter, attention was turned to gain access to the extrathoracic left subclavian vein, which was accomplished without difficulty without the aspiration of air or puncture of the artery.  Two separate venipunctures were accomplished.  Guidewires were placed and retained, and sequentially 7-French sheaths were placed through which was passed initially a Vaughn passive fixation ventricular lead, serial number [id number], an active fixation St. Jude [id number] lead, serial number [id number].  Under fluoroscopic guidance, these leads were manipulated to the right ventricular apex and the right atrial appendage respectively where the bipolar R-wave was 10.7 with a pace impedance of 746 and threshold of 0.5 at 0.4.  Current of threshold was 1.2 mA.  There was no diaphragmatic pacing at 10 V.  This lead was secured to the prepectoral fascia.  Bipolar P-wave was 2 with a pace impedance of 870 and threshold of 1.2 at 0.4.  Current of  threshold was 1.5 mA.  There is no diaphragmatic pacing at 10 V.  The current of injury was modest.  This lead was secured to the prepectoral fascia.  Then, the leads were attached to Medtronic Accent DR pulse generator, model PM2110, serial number [id number] ventricular pacing and atrial pacing were identified.  The pocket was copiously irrigated with antibiotic-containing saline solution as well as saline.  The leads and the pulse generator were placed in the pocket, secured to the prepectoral fascia.  The wound was then closed in 3 layers in normal fashion.  The wound was washed, dried, and a benzoin and Steri-Strip dressing was applied. Needle counts, sponge counts, and instrument counts were correct at the end of the procedure according to the staff.  The patient tolerated the procedure without apparent complication.     Deboraha Sprang, MD, [facility]     SCK/MEDQ  D:  05/06/2011  T:  05/07/2011  Job:  [id number]  cc:   Allegra Lai. Terrence Dupont, M.D.

## 2011-05-07 NOTE — Progress Notes (Signed)
   CARE MANAGEMENT NOTE 05/07/2011  Patient:  Julie Mcpherson, Julie Mcpherson   Account Number:  [id number]  Date Initiated:  05/07/2011  Documentation initiated by:  GRAVES-BIGELOW,Marcanthony Sleight  Subjective/Objective Assessment:   Pt admitted with Near-syncope. Pt is form [employer] [facility] [employer].     Action/Plan:   Plan for d/c in the skilled part of [facility]. CSW is working with pt and family.   Anticipated DC Date:  05/09/2011   Anticipated DC Plan:  SKILLED NURSING FACILITY  In-house referral  Clinical Social Worker      DC Planning Services  CM consult      Choice offered to / List presented to:             Status of service:  Completed, signed off Medicare Important Message given?   (If response is "NO", the following Medicare IM given date fields will be blank) Date Medicare IM given:   Date Additional Medicare IM given:    Discharge Disposition:  [facility]  Per UR Regulation:    If discussed at Long Length of Stay Meetings, dates discussed:    Comments:  05-07-11 1448 Jacqlyn Krauss, RN,BSN [phone] No other needs assessed by CM at this time.

## 2011-05-08 DIAGNOSIS — I4891 Unspecified atrial fibrillation: Secondary | ICD-10-CM | POA: Diagnosis not present

## 2011-05-08 DIAGNOSIS — I495 Sick sinus syndrome: Secondary | ICD-10-CM | POA: Diagnosis not present

## 2011-05-08 DIAGNOSIS — R55 Syncope and collapse: Secondary | ICD-10-CM | POA: Diagnosis not present

## 2011-05-08 DIAGNOSIS — I10 Essential (primary) hypertension: Secondary | ICD-10-CM | POA: Diagnosis not present

## 2011-05-08 NOTE — Progress Notes (Signed)
Subjective:  Patient denies any chest pain or shortness of breath Remains in sinus rhythm with occasional atrial paced rhythm  Objective:  Vital Signs in the last 24 hours: Temp:  [97 F (36.1 C)-98.3 F (36.8 C)] 97.2 F (36.2 C) (04/04 0800) Pulse Rate:  [61-75] 75  (04/04 1012) Resp:  [16-20] 18  (04/04 0800) BP: (120-157)/(48-78) 126/74 mmHg (04/04 1012) SpO2:  [94 %-97 %] 94 % (04/04 0800)  Intake/Output from previous day: 04/03 0701 - 04/04 0700 In: 1080 [P.O.:1080] Out: 200 [Urine:200] Intake/Output from this shift:   Physical Exam: Neck supple no JVD no bruit Lungs decreased breath sound at bases Cardiovascular exam regular rate and rhythm S1-S2 is normal there is soft systolic murmur pacemaker site okay with no evidence of hematoma Abdomen soft nontender Extremities there is no clubbing cyanosis or edema   Basename 05/06/11 0345 05/05/11 1725  WBC 6.8 7.7  HGB 10.3* 11.8*  PLT 183 208    Basename 05/06/11 0345 05/05/11 1725  NA 140 140  K 4.2 4.4  CL 107 105  CO2 25 24  GLUCOSE 93 88  BUN 22 24*  CREATININE 1.15* 1.20*    Basename 05/06/11 0907 05/05/11 2134  TROPONINI <0.30 <0.30   Hepatic Function Panel  Basename 05/05/11 1725  PROT 6.3  ALBUMIN 3.2*  AST 18  ALT 12  ALKPHOS 80  BILITOT 0.4  BILIDIR --  IBILI --    Basename 05/06/11 0345  CHOL 190   No results found for this basename: PROTIME in the last 72 hours  Imaging: Imaging results have been reviewed and Dg Chest Portable 1 View  05/07/2011  *RADIOLOGY REPORT*  Clinical Data: Status post pacemaker insertion.  PORTABLE CHEST - 1 VIEW  Comparison: Chest x-ray 05/05/2011.  Findings: Compared to the prior examination, there is been interval placement of a left sided pacemaker device, with one lead tip projecting over the expected location of the right atrium, and the other lead tip not well visualized (it appears to extend into the right ventricle).  Lung volumes remain low.  There are  bibasilar opacities (left much greater than right), which are favored to represent areas of atelectasis (underlying air space consolidation is difficult to exclude).  Blunting of the costophrenic sulci bilaterally suggest the presence of small bilateral pleural effusions.  Mild cardiomegaly is again noted, likely accentuated by patient rotation to the left which also distorts mediastinal contours.  There is some cephalization of the pulmonary vasculature and indistinctness of interstitial markings with diffuse peribronchial cuffing, likely in part to reflect underlying pulmonary edema (superimposed infection such as bronchitis is difficult to exclude).  No pneumothorax.  Severe calcifications of the mitral annulus.  IMPRESSION: 1.  Interval placement of pacemaker, as above, without pneumothorax or other acute complicating features. 2.  Persistent low lung volumes with slight worsening bibasilar opacities favored to reflect worsening bibasilar subsegmental atelectasis. 3.  Mild - moderate pulmonary edema slightly increased. 4.  Given the extent of peribronchial cuffing, clinical correlation for signs and symptoms of acute bronchitis is recommended. 5.  Small bilateral pleural effusions.  Original Report Authenticated By: Etheleen Mayhew, M.D.    Cardiac Studies:  Assessment/Plan:  Status post paroxysmal atrial fibrillation with pauses Status post permanent pacemaker Status post recent syncope Hypertension Degenerative joint disease History of diverticular disease and Bilateral atelectasis Anemia Chronic kidney disease stage IV GERD  Plan Increase ambulation Incentive spirometry Possible discharge to skilled nursing facility in a.m.  LOS: 3 days  Julie Mcpherson N 05/08/2011, 11:02 AM

## 2011-05-08 NOTE — Evaluation (Signed)
Physical Therapy Evaluation Patient Details Name: Julie Mcpherson MRN: [id number] DOB: [birth year] Today's Date: 05/08/2011  Problem List:  Patient Active Problem List  Diagnoses  . New onset atrial fibrillation    Past Medical History:  Past Medical History  Diagnosis Date  . Hypertension   . GERD (gastroesophageal reflux disease)   . Arthritis   . Urinary frequency   . Diverticular disease   . Chronic kidney disease     stage 3 renal disease  . High cholesterol   . DVT (deep venous thrombosis) early 1980's    left medial upper thigh  . Blood transfusion   . Skin cancer of face   . DJD (degenerative joint disease)   . Overactive bladder   . Syncope and collapse 05/03/11    "passed out; first time ever"  . Atrial fibrillation   . Tachy-brady syndrome   . Fracture, shoulder ~ 2003; 1997    left; right   Past Surgical History:  Past Surgical History  Procedure Date  . Bladder suspension 1985  . Carpal tunnel release 04/18/2011    right; Procedure: CARPAL TUNNEL RELEASE;  Surgeon: Cammie Sickle., MD;  Location: [facility];  Service: Orthopedics;  Laterality: Right;  . Appendectomy ~ 1938  . Hernia repair 1986    "stomach"  . Total hip arthroplasty     "2 on right; 1 on left"  . Joint replacement     hips  . Cataract extraction w/ intraocular lens  implant, bilateral   . Skin cancer excision 2012    "twice"    PT Assessment/Plan/Recommendation PT Assessment Clinical Impression Statement: Pt s/p pacemaker placement, pt with long standing leg length discrepancy and used elevated shoes to ambulate. Pt very pleasant and aware of current restrictions but agree that additional assist currently will be necessary to maintain precautions. Will follow acutely to maximize independence before transfer to next venue. PT Recommendation/Assessment: Patient will need skilled PT in the acute care venue PT Problem List: Decreased range of motion;Decreased  mobility;Decreased activity tolerance;Decreased knowledge of precautions Barriers to Discharge: None PT Therapy Diagnosis : Abnormality of gait PT Plan PT Frequency: Min 3X/week PT Treatment/Interventions: DME instruction;Therapeutic activities;Therapeutic exercise;Gait training;Functional mobility training;Patient/family education PT Recommendation Recommendations for Other Services: OT consult Follow Up Recommendations: Skilled nursing facility Equipment Recommended: None recommended by PT PT Goals  Acute Rehab PT Goals PT Goal Formulation: With patient Pt will go Supine/Side to Sit: with supervision PT Goal: Supine/Side to Sit - Progress: Goal set today Pt will go Sit to Supine/Side: with supervision PT Goal: Sit to Supine/Side - Progress: Goal set today Pt will go Sit to Stand: with supervision PT Goal: Sit to Stand - Progress: Goal set today Pt will go Stand to Sit: with supervision PT Goal: Stand to Sit - Progress: Goal set today Pt will Ambulate: >150 feet;with modified independence;with rolling walker PT Goal: Ambulate - Progress: Goal set today  PT Evaluation Precautions/Restrictions  Precautions Precautions: ICD/Pacemaker;Fall Prior Functioning  Home Living Lives With: Alone Type of Home: Independent living facility Home Layout: One level Home Access: Level entry Home Adaptive Equipment: Walker - four wheeled Prior Function Level of Independence: Independent with basic ADLs;Independent with transfers;Requires assistive device for independence;Needs assistance with homemaking Meal Prep: Moderate (pt fixes breakfast and goes to dining hall for other meals) Light Housekeeping: Moderate Driving: No Vocation: Retired Comments: Pt retired from [employer]: Awake/alert Overall Cognitive Status: Appears within functional limits for tasks assessed  Orientation Level: Oriented X4 Sensation/Coordination Sensation Light Touch: Appears  Intact Extremity Assessment RLE Assessment RLE Assessment: Within Functional Limits LLE Assessment LLE Assessment: Within Functional Limits Mobility (including Balance) Bed Mobility Bed Mobility: Yes Supine to Sit: HOB flat;4: Min assist Supine to Sit Details (indicate cue type and reason): assist to prevent excessive use of LUE Sitting - Scoot to Edge of Bed: 5: Supervision Sitting - Scoot to [employer] of Bed Details (indicate cue type and reason): cueing for reciprocal scooting Transfers Transfers: Yes Sit to Stand: 4: Min assist;From chair/3-in-1;From bed Sit to Stand Details (indicate cue type and reason): assist to complete anterior translation Stand to Sit: 5: Supervision;To chair/3-in-1 Stand to Sit Details: cueing for safety Ambulation/Gait Ambulation/Gait: Yes Ambulation/Gait Assistance: 6: Modified independent (Device/Increase time) Ambulation Distance (Feet): 400 Feet Assistive device: Rolling walker Gait Pattern: Within Functional Limits;Step-through pattern Gait velocity: decreased speed which pt reports is baseline  Posture/Postural Control Posture/Postural Control: No significant limitations Balance Balance Assessed: No (pt used 3 wheel RW before admission) Exercise    End of Session PT - End of Session Equipment Utilized During Treatment: Gait belt Activity Tolerance: Patient tolerated treatment well Patient left: in chair;with call bell in reach;with family/visitor present Nurse Communication: Mobility status for transfers;Mobility status for ambulation General Behavior During Session: [facility] for tasks performed Cognition: [facility] for tasks performed  Melford Aase 05/08/2011, 11:17 AM  Elwyn Reach, [place]

## 2011-05-09 DIAGNOSIS — K219 Gastro-esophageal reflux disease without esophagitis: Secondary | ICD-10-CM | POA: Diagnosis not present

## 2011-05-09 DIAGNOSIS — M25559 Pain in unspecified hip: Secondary | ICD-10-CM | POA: Diagnosis not present

## 2011-05-09 DIAGNOSIS — S7290XA Unspecified fracture of unspecified femur, initial encounter for closed fracture: Secondary | ICD-10-CM | POA: Diagnosis not present

## 2011-05-09 DIAGNOSIS — M1990 Unspecified osteoarthritis, unspecified site: Secondary | ICD-10-CM | POA: Diagnosis not present

## 2011-05-09 DIAGNOSIS — I495 Sick sinus syndrome: Secondary | ICD-10-CM | POA: Diagnosis not present

## 2011-05-09 DIAGNOSIS — Z5189 Encounter for other specified aftercare: Secondary | ICD-10-CM | POA: Diagnosis not present

## 2011-05-09 DIAGNOSIS — R55 Syncope and collapse: Secondary | ICD-10-CM | POA: Diagnosis not present

## 2011-05-09 DIAGNOSIS — I129 Hypertensive chronic kidney disease with stage 1 through stage 4 chronic kidney disease, or unspecified chronic kidney disease: Secondary | ICD-10-CM | POA: Diagnosis not present

## 2011-05-09 DIAGNOSIS — N182 Chronic kidney disease, stage 2 (mild): Secondary | ICD-10-CM | POA: Diagnosis not present

## 2011-05-09 DIAGNOSIS — D649 Anemia, unspecified: Secondary | ICD-10-CM | POA: Diagnosis not present

## 2011-05-09 DIAGNOSIS — I4891 Unspecified atrial fibrillation: Secondary | ICD-10-CM | POA: Diagnosis not present

## 2011-05-09 DIAGNOSIS — I10 Essential (primary) hypertension: Secondary | ICD-10-CM | POA: Diagnosis not present

## 2011-05-09 MED ORDER — ASPIRIN 81 MG PO CHEW: 81.0000 mg | CHEWABLE_TABLET | Freq: Every day | ORAL | Status: AC

## 2011-05-09 MED ORDER — LOSARTAN POTASSIUM 50 MG PO TABS: 50.0000 mg | ORAL_TABLET | Freq: Every day | ORAL | Status: DC

## 2011-05-09 NOTE — Discharge Summary (Signed)
  Discharge summary dictated on 05/09/2011 dictation number is [phone]

## 2011-05-09 NOTE — Evaluation (Signed)
Occupational Therapy Evaluation Patient Details Name: Julie Mcpherson MRN: [id number] DOB: 1913/02/19 Today's Date: 05/09/2011  Problem List:  Patient Active Problem List  Diagnoses  . New onset atrial fibrillation    Past Medical History:  Past Medical History  Diagnosis Date  . Hypertension   . GERD (gastroesophageal reflux disease)   . Arthritis   . Urinary frequency   . Diverticular disease   . Chronic kidney disease     stage 3 renal disease  . High cholesterol   . DVT (deep venous thrombosis) early 1980's    left medial upper thigh  . Blood transfusion   . Skin cancer of face   . DJD (degenerative joint disease)   . Overactive bladder   . Syncope and collapse 05/03/11    "passed out; first time ever"  . Atrial fibrillation   . Tachy-brady syndrome   . Fracture, shoulder ~ 2003; 1997    left; right   Past Surgical History:  Past Surgical History  Procedure Date  . Bladder suspension 1985  . Carpal tunnel release 04/18/2011    right; Procedure: CARPAL TUNNEL RELEASE;  Surgeon: Cammie Sickle., MD;  Location: [facility];  Service: Orthopedics;  Laterality: Right;  . Appendectomy ~ 1938  . Hernia repair 1986    "stomach"  . Total hip arthroplasty     "2 on right; 1 on left"  . Joint replacement     hips  . Cataract extraction w/ intraocular lens  implant, bilateral   . Skin cancer excision 2012    "twice"    OT Assessment/Plan/Recommendation  SNF then d/c to ILF OT Goals  See doc flow sheets  OT Evaluation Please see doc flow sheets for full eval performed on 05/08/11 Precautions/Restrictions  Precautions Precautions: ICD/Pacemaker;Fall End of Session  Left in bed with call bell within reach   [place] 05/09/2011, 3:45 PM

## 2011-05-09 NOTE — Discharge Instructions (Signed)
Atrial Fibrillation Your caregiver has diagnosed you with atrial fibrillation (AFib). The heart normally beats very regularly; AFib is a type of irregular heartbeat. The heart rate may be faster or slower than normal. This can prevent your heart from pumping as well as it should. AFib can be constant (chronic) or intermittent (paroxysmal). CAUSES  Atrial fibrillation may be caused by:  Heart disease, including heart attack, coronary artery disease, heart failure, diseases of the heart valves, and others.   Blood clot in the lungs (pulmonary embolism).   Pneumonia or other infections.   Chronic lung disease.   Thyroid disease.   Toxins. These include alcohol, some medications (such as decongestant medications or diet pills), and caffeine.  In some people, no cause for AFib can be found. This is referred to as Lone Atrial Fibrillation. SYMPTOMS   Palpitations or a fluttering in your chest.   A vague sense of chest discomfort.   Shortness of breath.   Sudden onset of lightheadedness or weakness.  Sometimes, the first sign of AFib can be a complication of the condition. This could be a stroke or heart failure. DIAGNOSIS  Your description of your condition may make your caregiver suspicious of atrial fibrillation. Your caregiver will examine your pulse to determine if fibrillation is present. An EKG (electrocardiogram) will confirm the diagnosis. Further testing may help determine what caused you to have atrial fibrillation. This may include chest x-ray, echocardiogram, blood tests, or CT scans. PREVENTION  If you have previously had atrial fibrillation, your caregiver may advise you to avoid substances known to cause the condition (such as stimulant medications, and possibly caffeine or alcohol). You may be advised to use medications to prevent recurrence. Proper treatment of any underlying condition is important to help prevent recurrence. PROGNOSIS  Atrial fibrillation does tend to  become a chronic condition over time. It can cause significant complications (see below). Atrial fibrillation is not usually immediately life-threatening, but it can shorten your life expectancy. This seems to be worse in women. If you have lone atrial fibrillation and are under 70 years old, the risk of complications is very low, and life expectancy is not shortened. RISKS AND COMPLICATIONS  Complications of atrial fibrillation can include stroke, chest pain, and heart failure. Your caregiver will recommend treatments for the atrial fibrillation, as well as for any underlying conditions, to help minimize risk of complications. TREATMENT  Treatment for AFib is divided into several categories:  Treatment of any underlying condition.   Converting you out of AFib into a regular (sinus) rhythm.   Controlling rapid heart rate.   Prevention of blood clots and stroke.  Medications and procedures are available to convert your atrial fibrillation to sinus rhythm. However, recent studies have shown that this may not offer you any advantage, and cardiac experts are continuing research and debate on this topic. More important is controlling your rapid heartbeat. The rapid heartbeat causes more symptoms, and places strain on your heart. Your caregiver will advise you on the use of medications that can control your heart rate. Atrial fibrillation is a strong stroke risk. You can lessen this risk by taking blood thinning medications such as Coumadin (warfarin), or sometimes aspirin. These medications need close monitoring by your caregiver. Over-medication can cause bleeding. Too little medication may not protect against stroke. HOME CARE INSTRUCTIONS   If your caregiver prescribed medicine to make your heartbeat more normally, take as directed.   If blood thinners were prescribed by your caregiver, take EXACTLY as directed.  Perform blood tests EXACTLY as directed.   Quit smoking. Smoking increases your  cardiac and lung (pulmonary) risks.   DO NOT drink alcohol.   DO NOT drink caffeinated drinks (e.g. coffee, soda, chocolate, and leaf teas). You may drink decaffeinated coffee, soda or tea.   If you are overweight, you should choose a reduced calorie diet to lose weight. Please see a registered dietitian if you need more information about healthy weight loss. DO NOT USE DIET PILLS as they may aggravate heart problems.   If you have other heart problems that are causing AFib, you may need to eat a low salt, fat, and cholesterol diet. Your caregiver will tell you if this is necessary.   Exercise every day to improve your physical fitness. Stay active unless advised otherwise.   If your caregiver has given you a follow-up appointment, it is very important to keep that appointment. Not keeping the appointment could result in heart failure or stroke. If there is any problem keeping the appointment, you must call back to this facility for assistance.  SEEK MEDICAL CARE IF:  You notice a change in the rate, rhythm or strength of your heartbeat.   You develop an infection or any other change in your overall health status.  SEEK IMMEDIATE MEDICAL CARE IF:   You develop chest pain, abdominal pain, sweating, weakness or feel sick to your stomach (nausea).   You develop shortness of breath.   You develop swollen feet and ankles.   You develop dizziness, numbness, or weakness of your face or limbs, or any change in vision or speech.  MAKE SURE YOU:   Understand these instructions.   Will watch your condition.   Will get help right away if you are not doing well or get worse.  Document Released: 01/20/2005 Document Revised: 01/09/2011 Document Reviewed: 08/25/2007 [facility] Patient Information 2012 [place].

## 2011-05-09 NOTE — Discharge Summary (Signed)
NAMEKYLINA, BAUERNFEIND NO.:  [id number]  MEDICAL RECORD NO.:  [id number]  LOCATION:  2030                         FACILITY:  [place]  PHYSICIAN:  Allegra Lai. Terrence Dupont, M.D. DATE OF BIRTH:  08-23-1913  DATE OF ADMISSION:  05/05/2011 DATE OF DISCHARGE:  05/09/2011                              DISCHARGE SUMMARY   ADMITTING DIAGNOSES: 1. New onset atrial fibrillation with moderate ventricular response 2. Sick sinus syndrome with symptomatic pauses, status post recent     syncope. 3. Uncontrolled hypertension. 4. Degenerative joint disease. 5. History of diverticular disease. 6. Chronic kidney disease, stage II. 7. Gastroesophageal reflux disease.  FINAL DIAGNOSES: 1. Status post paroxysmal atrial fibrillation, with asymptomatic     pauses up to 5 seconds, status post permanent pacemaker. 2. Sick sinus syndrome. 3. Status post recent syncope. 4. Hypertension. 5. Degenerative joint disease. 6. History of diverticular disease. 7. Bilateral atelectasis. 8. Chronic kidney disease, stage II. 9. Gastroesophageal reflux disease.  DISCHARGE HOME MEDICATIONS: 1. Enteric-coated aspirin 81 mg 1 tablet daily. 2. Losartan 50 mg 1 tablet daily. 3. Tylenol 500 mg twice daily as needed for pain. 4. Calcium carbonate 600 mg twice daily as before. 5. Vitamin D 1000 units daily as before. 6. Omega-3 1 g daily as before. 7. Toprol-XL 25 mg 1 tablet daily. 8. Omeprazole 40 mg 1 capsule daily. 9. Oxybutynin 15 mg daily as before.  DIET:  Low salt, low cholesterol.  ACTIVITY:  Increase activity slowly as tolerated.  The patient will be discharged to skilled nursing facility.  Follow up with me in 2 weeks. Follow up with Dr. Caryl Comes for pacemaker followup as scheduled.  CONDITION AT DISCHARGE:  Stable.  BRIEF HISTORY AND HOSPITAL COURSE:  Ms. Ravitz is a 78 year old female with past medical history significant for hypertension, GERD, degenerative joint disease, history of  diverticular disease, chronic kidney disease stage IV, history of overactive bladder.  She came to the ER complaining of dizziness, lightheadedness, and was noted to be in AFib with moderate ventricular response.  The patient was noted to have frequent pauses up to 4 seconds.  The patient was seen at [facility] on Saturday, 2 days ago because of similar symptoms and had syncopal episode.  Her workup was negative and was noted to be in sinus rhythm at that time.  The patient denies any chest pain, nausea, vomiting, diaphoresis.  The patient was noted to be in AFib with frequent pauses on the monitor with moderate ventricular response.  The patient presently denies any chest pain, nausea, vomiting, diaphoresis. Denies any dizziness, lightheadedness.  Denies history of PND, orthopnea, or leg swelling.  The patient denies any thyroid problems in the past.  Denies any recent weight loss or weight gain.  PAST MEDICAL HISTORY:  As above.  PAST SURGICAL HISTORY:  She had both hip joint replacements in the past, had hernia repair in 1986, had appendectomy in the past, had carpal tunnel surgery approximately 2 weeks ago.  SOCIAL HISTORY:  No history of smoking or alcohol abuse.  ALLERGIES:  She has intolerance to STATINS and CODEINE.  She is allergic to SULFA ANTIBIOTICS and TETANUS TOXOID.  PHYSICAL EXAMINATION:  GENERAL:  She was alert, awake, oriented. VITAL SIGNS:  Blood pressure was 165/95, pulse was 114 irregularly irregular with frequent pauses, suggestive of tachy-brady syndrome. HEENT:  Conjunctivae was pink. NECK:  Supple.  No JVD.  No bruits. LUNGS:  Decreased breath sounds at bases with no rhonchi or rales. CARDIOVASCULAR:  Irregularly irregular, S1 and S2 was soft.  There was no S3 gallop.  There was soft systolic murmur at left lower sternal border. ABDOMEN:  Soft.  Bowel sounds are present.  Nontender. EXTREMITIES:  There is no clubbing, cyanosis, or  edema.  LABORATORY DATA:  Sodium was 139, potassium 4.6, BUN 35, creatinine 1.30.  On May 06, 2011, sodium was 140, potassium 4.2, BUN 22, creatinine 1.15.  Glucose is 93.  Her 3 sets of cardiac enzymes were negative.  Cholesterol was 190, triglycerides 72, HDL was 59, LDL 117. Her hemoglobin was 11.9, hematocrit 36.3, white count of 9.8.  Her repeat EKG showed a normal sinus rhythm with occasional PVCs and nonspecific T-wave changes.  EKG done on May 07, 2011 showed normal sinus rhythm with left axis deviation and nonspecific T-wave changes.  BRIEF HOSPITAL COURSE:  The patient was initially admitted to telemetry unit.  The patient had frequent pauses up to 5 seconds requiring temporary transvenous pacemaker via right femoral approach without any problems.  EP consultation was obtained with Dr. Caryl Comes.  The patient subsequently underwent BiV pacemaker without any complications.  The patient remained in sinus rhythm with atrial paced rhythm.  The patient was restarted on beta-blockers.  The patient did not have any further episodes of atrial fibrillation during the hospital stay.  Discussed with family regarding chronic anticoagulation versus just the rate control due to advanced age and high risk for fall.  The patient was felt not a good candidate for chronic anticoagulation, which the patient and family agreed.  The patient will be discharged to skilled nursing facilities and will be followed up in my office in 2 weeks and Dr. Caryl Comes as scheduled.     Allegra Lai. Terrence Dupont, M.D.     MNH/MEDQ  D:  05/09/2011  T:  05/09/2011  Job:  [id number]

## 2011-05-09 NOTE — Progress Notes (Signed)
Clinical Social Worker facilitated discharge by contacting facility, [facility], and family. Patient will be transported via private vehicle of family member. Patient's discharge packet with be given to patient to take with her to facility. CSW will sign off.   Leandro Reasoner MSW, [place]

## 2011-05-09 NOTE — Progress Notes (Signed)
OT Cancellation Note  Treatment cancelled today due to patient's refusal to participate as pt is to d/c soon to SNF.  Sonya Gunnoe 05/09/2011, 3:47 PM

## 2011-05-09 NOTE — Progress Notes (Signed)
Pt/family given discharge instructions, medication lists, follow up appointments, and when to call the doctor. Pt being transferred to [facility] by her son.  Pt verbalized understanding of instructions. Payton Emerald

## 2011-05-11 DIAGNOSIS — I10 Essential (primary) hypertension: Secondary | ICD-10-CM | POA: Diagnosis not present

## 2011-05-11 DIAGNOSIS — I4891 Unspecified atrial fibrillation: Secondary | ICD-10-CM | POA: Diagnosis not present

## 2011-05-11 DIAGNOSIS — M25559 Pain in unspecified hip: Secondary | ICD-10-CM | POA: Diagnosis not present

## 2011-05-19 ENCOUNTER — Ambulatory Visit: Payer: Medicare Other

## 2011-05-23 DIAGNOSIS — M25559 Pain in unspecified hip: Secondary | ICD-10-CM | POA: Diagnosis not present

## 2011-05-23 DIAGNOSIS — K219 Gastro-esophageal reflux disease without esophagitis: Secondary | ICD-10-CM | POA: Diagnosis not present

## 2011-05-23 DIAGNOSIS — S7290XA Unspecified fracture of unspecified femur, initial encounter for closed fracture: Secondary | ICD-10-CM | POA: Diagnosis not present

## 2011-05-23 DIAGNOSIS — I10 Essential (primary) hypertension: Secondary | ICD-10-CM | POA: Diagnosis not present

## 2011-05-23 DIAGNOSIS — R55 Syncope and collapse: Secondary | ICD-10-CM | POA: Diagnosis not present

## 2011-05-29 ENCOUNTER — Encounter: Payer: Self-pay | Admitting: Internal Medicine

## 2011-05-29 ENCOUNTER — Ambulatory Visit (INDEPENDENT_AMBULATORY_CARE_PROVIDER_SITE_OTHER): Payer: Medicare Other | Admitting: *Deleted

## 2011-05-29 ENCOUNTER — Ambulatory Visit: Payer: Medicare Other

## 2011-05-29 DIAGNOSIS — I495 Sick sinus syndrome: Secondary | ICD-10-CM

## 2011-05-29 LAB — PACEMAKER DEVICE OBSERVATION
AL AMPLITUDE: 1.7 mv
AL IMPEDENCE PM: 375 Ohm
AL THRESHOLD: 1 V
ATRIAL PACING PM: 70
BAMS-0001: 150 {beats}/min
BAMS-0003: 70 {beats}/min
BATTERY VOLTAGE: 2.9779 V
DEVICE MODEL PM: 7317017
RV LEAD AMPLITUDE: 12 mv
RV LEAD IMPEDENCE PM: 850 Ohm
RV LEAD THRESHOLD: 0.5 V
VENTRICULAR PACING PM: 1

## 2011-05-29 NOTE — Progress Notes (Signed)
Wound check-PPM 

## 2011-05-30 ENCOUNTER — Telehealth: Payer: Self-pay | Admitting: *Deleted

## 2011-05-30 ENCOUNTER — Telehealth: Payer: Self-pay | Admitting: Internal Medicine

## 2011-05-30 NOTE — Telephone Encounter (Signed)
Walk In pt Form " pt has Questions about medication" sent to [facility] Nurse  05/30/11/KM

## 2011-05-30 NOTE — Telephone Encounter (Signed)
Pt stopped at desk 4/25 and asked if we could find out what medication she was taking when in nursing home--by the time i phoned her back she had already called nursing home and had someone there trying to find out what med she was taking to prevent stroke--pt states she can get what she needs from them and we can ignore this call--nt--she thinks the med was losartan?

## 2011-06-17 DIAGNOSIS — M79609 Pain in unspecified limb: Secondary | ICD-10-CM | POA: Diagnosis not present

## 2011-06-17 DIAGNOSIS — B351 Tinea unguium: Secondary | ICD-10-CM | POA: Diagnosis not present

## 2011-06-20 DIAGNOSIS — M25559 Pain in unspecified hip: Secondary | ICD-10-CM | POA: Diagnosis not present

## 2011-06-20 DIAGNOSIS — M25569 Pain in unspecified knee: Secondary | ICD-10-CM | POA: Diagnosis not present

## 2011-06-26 DIAGNOSIS — H524 Presbyopia: Secondary | ICD-10-CM | POA: Diagnosis not present

## 2011-06-26 DIAGNOSIS — H04129 Dry eye syndrome of unspecified lacrimal gland: Secondary | ICD-10-CM | POA: Diagnosis not present

## 2011-06-26 DIAGNOSIS — H52209 Unspecified astigmatism, unspecified eye: Secondary | ICD-10-CM | POA: Diagnosis not present

## 2011-06-26 DIAGNOSIS — Z961 Presence of intraocular lens: Secondary | ICD-10-CM | POA: Diagnosis not present

## 2011-07-18 DIAGNOSIS — R29898 Other symptoms and signs involving the musculoskeletal system: Secondary | ICD-10-CM | POA: Diagnosis not present

## 2011-08-13 DIAGNOSIS — M1710 Unilateral primary osteoarthritis, unspecified knee: Secondary | ICD-10-CM | POA: Diagnosis not present

## 2011-08-15 ENCOUNTER — Encounter: Payer: Self-pay | Admitting: *Deleted

## 2011-08-15 DIAGNOSIS — Z950 Presence of cardiac pacemaker: Secondary | ICD-10-CM | POA: Insufficient documentation

## 2011-08-22 ENCOUNTER — Ambulatory Visit (INDEPENDENT_AMBULATORY_CARE_PROVIDER_SITE_OTHER): Payer: Medicare Other | Admitting: Internal Medicine

## 2011-08-22 ENCOUNTER — Encounter: Payer: Self-pay | Admitting: Internal Medicine

## 2011-08-22 VITALS — BP 138/72 | HR 60 | Ht 64.0 in | Wt 158.8 lb

## 2011-08-22 DIAGNOSIS — Z950 Presence of cardiac pacemaker: Secondary | ICD-10-CM

## 2011-08-22 DIAGNOSIS — I10 Essential (primary) hypertension: Secondary | ICD-10-CM | POA: Diagnosis not present

## 2011-08-22 DIAGNOSIS — I4891 Unspecified atrial fibrillation: Secondary | ICD-10-CM | POA: Diagnosis not present

## 2011-08-22 DIAGNOSIS — I495 Sick sinus syndrome: Secondary | ICD-10-CM | POA: Diagnosis not present

## 2011-08-22 LAB — PACEMAKER DEVICE OBSERVATION
AL AMPLITUDE: 3.3 mv
AL IMPEDENCE PM: 387.5 Ohm
AL THRESHOLD: 0.75 V
ATRIAL PACING PM: 77
BAMS-0001: 150 {beats}/min
BAMS-0003: 70 {beats}/min
BATTERY VOLTAGE: 2.9328 V
DEVICE MODEL PM: 7317017
RV LEAD AMPLITUDE: 12 mv
RV LEAD IMPEDENCE PM: 937.5 Ohm
RV LEAD THRESHOLD: 0.75 V
VENTRICULAR PACING PM: 1

## 2011-08-22 NOTE — Assessment & Plan Note (Signed)
The patient's device was interrogated and the information was fully reviewed.  The device was reprogrammed to  Maximize longevity 

## 2011-08-22 NOTE — Patient Instructions (Signed)
Your physician wants you to follow-up in: April 2014 with Dr. Klein. You will receive a reminder letter in the mail two months in advance. If you don't receive a letter, please call our office to schedule the follow-up appointment.  Your physician recommends that you continue on your current medications as directed. Please refer to the Current Medication list given to you today.  

## 2011-08-22 NOTE — Progress Notes (Signed)
HPI  Julie Mcpherson is a 78 y.o. female Seen following pacemaker implantation April 2013 for atrial fibrillation with post termination pauses. She is doing much better without chest pain or shortness of breath or lightheadedness  Past Medical History  Diagnosis Date  . Hypertension   . GERD (gastroesophageal reflux disease)   . Arthritis   . Urinary frequency   . Diverticular disease   . Chronic kidney disease     stage 3 renal disease  . High cholesterol   . DVT (deep venous thrombosis) early 1980's    left medial upper thigh  . Blood transfusion   . Skin cancer of face   . DJD (degenerative joint disease)   . Overactive bladder   . Syncope and collapse 05/03/11    "passed out; first time ever"  . Atrial fibrillation   . Tachy-brady syndrome   . Fracture, shoulder ~ 2003; 1997    left; right    Past Surgical History  Procedure Date  . Bladder suspension 1985  . Carpal tunnel release 04/18/2011    right; Procedure: CARPAL TUNNEL RELEASE;  Surgeon: Cammie Sickle., MD;  Location: [facility];  Service: Orthopedics;  Laterality: Right;  . Appendectomy ~ 1938  . Hernia repair 1986    "stomach"  . Total hip arthroplasty     "2 on right; 1 on left"  . Joint replacement     hips  . Cataract extraction w/ intraocular lens  implant, bilateral   . Skin cancer excision 2012    "twice"    Current Outpatient Prescriptions  Medication Sig Dispense Refill  . acetaminophen (TYLENOL) 500 MG tablet Take 500 mg by mouth 2 (two) times daily as needed. For pain      . aspirin 81 MG chewable tablet Chew 1 tablet (81 mg total) by mouth daily.  30 tablet  3  . calcium carbonate (OS-CAL) 600 MG TABS Take 600 mg by mouth 2 (two) times daily with a meal.      . cholecalciferol (VITAMIN D) 1000 UNITS tablet Take 1,000 Units by mouth daily.      . fish oil-omega-3 fatty acids 1000 MG capsule Take 1 g by mouth daily.      [name] losartan (COZAAR) 50 MG tablet Take 1 tablet (50  mg total) by mouth daily.  30 tablet  3  . metoprolol succinate (TOPROL-XL) 25 MG 24 hr tablet Take 25 mg by mouth daily.      [name] omeprazole (PRILOSEC) 40 MG capsule Take 40 mg by mouth daily.      [name] oxybutynin (DITROPAN XL) 15 MG 24 hr tablet Take 15 mg by mouth daily.        Allergies  Allergen Reactions  . Codeine Other (See Comments)    "can't sleep; I climb the walls"  . Statins Other (See Comments)    "ocular migraine headaches"  . Sulfa Antibiotics Anaphylaxis, Itching and Rash  . Tetanus Toxoids Other (See Comments)    "think it started turning red around where I got shot; they gave me small doses and I did fine"    Review of Systems negative except from HPI and PMH  Physical Exam BP 138/72  Pulse 60  Ht 5\' 4"  (1.626 m)  Wt 158 lb 12.8 oz (72.031 kg)  BMI 27.26 kg/m2  Well developed and well nourished in no acute distress HENT normal E scleral and icterus clear Neck Supple JVP flat; carotids brisk and full Clear to  ausculation Device pocket well healed; without hematoma or erythema \\r and egular rate and rhythm, no murmurs gallops or rub Soft with active bowel sounds No clubbing cyanosis none Edema Alert and oriented, grossly normal motor and sensory function Skin Warm and Dry    Assessment and  Plan

## 2011-08-22 NOTE — Assessment & Plan Note (Signed)
Stable post pacing 

## 2011-08-22 NOTE — Assessment & Plan Note (Signed)
Infrequent atrial fibrillation

## 2011-10-10 NOTE — Addendum Note (Signed)
Addended by: Nixon Sparr, Dola Factor D on: 10/10/2011 02:58 PM   Modules accepted: Orders

## 2011-10-16 DIAGNOSIS — I129 Hypertensive chronic kidney disease with stage 1 through stage 4 chronic kidney disease, or unspecified chronic kidney disease: Secondary | ICD-10-CM | POA: Diagnosis not present

## 2011-10-16 DIAGNOSIS — E7800 Pure hypercholesterolemia, unspecified: Secondary | ICD-10-CM | POA: Diagnosis not present

## 2011-10-16 DIAGNOSIS — N183 Chronic kidney disease, stage 3 unspecified: Secondary | ICD-10-CM | POA: Diagnosis not present

## 2011-10-16 DIAGNOSIS — Z79899 Other long term (current) drug therapy: Secondary | ICD-10-CM | POA: Diagnosis not present

## 2011-10-27 DIAGNOSIS — G5600 Carpal tunnel syndrome, unspecified upper limb: Secondary | ICD-10-CM | POA: Diagnosis not present

## 2011-11-18 DIAGNOSIS — R300 Dysuria: Secondary | ICD-10-CM | POA: Diagnosis not present

## 2011-11-27 ENCOUNTER — Other Ambulatory Visit: Payer: Self-pay | Admitting: Orthopedic Surgery

## 2011-11-28 DIAGNOSIS — I4891 Unspecified atrial fibrillation: Secondary | ICD-10-CM | POA: Diagnosis not present

## 2011-11-28 DIAGNOSIS — I10 Essential (primary) hypertension: Secondary | ICD-10-CM | POA: Diagnosis not present

## 2011-11-28 DIAGNOSIS — I495 Sick sinus syndrome: Secondary | ICD-10-CM | POA: Diagnosis not present

## 2011-11-28 DIAGNOSIS — K219 Gastro-esophageal reflux disease without esophagitis: Secondary | ICD-10-CM | POA: Diagnosis not present

## 2011-12-05 ENCOUNTER — Encounter (HOSPITAL_BASED_OUTPATIENT_CLINIC_OR_DEPARTMENT_OTHER): Payer: Self-pay | Admitting: Anesthesiology

## 2011-12-05 ENCOUNTER — Encounter (HOSPITAL_BASED_OUTPATIENT_CLINIC_OR_DEPARTMENT_OTHER): Payer: Self-pay | Admitting: *Deleted

## 2011-12-05 ENCOUNTER — Ambulatory Visit (HOSPITAL_BASED_OUTPATIENT_CLINIC_OR_DEPARTMENT_OTHER): Payer: Medicare Other | Admitting: Anesthesiology

## 2011-12-05 ENCOUNTER — Encounter (HOSPITAL_BASED_OUTPATIENT_CLINIC_OR_DEPARTMENT_OTHER)
Admission: RE | Disposition: A | Discharge status: Home / Self Care | Payer: Self-pay | Source: Ambulatory Visit | Attending: Orthopedic Surgery

## 2011-12-05 ENCOUNTER — Ambulatory Visit (HOSPITAL_BASED_OUTPATIENT_CLINIC_OR_DEPARTMENT_OTHER)
Admission: RE | Admit: 2011-12-05 | Discharge: 2011-12-05 | Disposition: A | Discharge status: Home / Self Care | Payer: Medicare Other | Source: Ambulatory Visit | Attending: Orthopedic Surgery | Admitting: Orthopedic Surgery

## 2011-12-05 DIAGNOSIS — K219 Gastro-esophageal reflux disease without esophagitis: Secondary | ICD-10-CM | POA: Insufficient documentation

## 2011-12-05 DIAGNOSIS — Z950 Presence of cardiac pacemaker: Secondary | ICD-10-CM | POA: Insufficient documentation

## 2011-12-05 DIAGNOSIS — Z86718 Personal history of other venous thrombosis and embolism: Secondary | ICD-10-CM | POA: Insufficient documentation

## 2011-12-05 DIAGNOSIS — Z85828 Personal history of other malignant neoplasm of skin: Secondary | ICD-10-CM | POA: Insufficient documentation

## 2011-12-05 DIAGNOSIS — N183 Chronic kidney disease, stage 3 unspecified: Secondary | ICD-10-CM | POA: Diagnosis not present

## 2011-12-05 DIAGNOSIS — Z7982 Long term (current) use of aspirin: Secondary | ICD-10-CM | POA: Diagnosis not present

## 2011-12-05 DIAGNOSIS — Z79899 Other long term (current) drug therapy: Secondary | ICD-10-CM | POA: Diagnosis not present

## 2011-12-05 DIAGNOSIS — I129 Hypertensive chronic kidney disease with stage 1 through stage 4 chronic kidney disease, or unspecified chronic kidney disease: Secondary | ICD-10-CM | POA: Insufficient documentation

## 2011-12-05 DIAGNOSIS — G5600 Carpal tunnel syndrome, unspecified upper limb: Secondary | ICD-10-CM | POA: Insufficient documentation

## 2011-12-05 DIAGNOSIS — K5730 Diverticulosis of large intestine without perforation or abscess without bleeding: Secondary | ICD-10-CM | POA: Diagnosis not present

## 2011-12-05 DIAGNOSIS — I4891 Unspecified atrial fibrillation: Secondary | ICD-10-CM | POA: Diagnosis not present

## 2011-12-05 DIAGNOSIS — M129 Arthropathy, unspecified: Secondary | ICD-10-CM | POA: Insufficient documentation

## 2011-12-05 DIAGNOSIS — M1990 Unspecified osteoarthritis, unspecified site: Secondary | ICD-10-CM | POA: Diagnosis not present

## 2011-12-05 DIAGNOSIS — Z01812 Encounter for preprocedural laboratory examination: Secondary | ICD-10-CM | POA: Insufficient documentation

## 2011-12-05 HISTORY — PX: CARPAL TUNNEL RELEASE: SHX101

## 2011-12-05 LAB — POCT I-STAT, CHEM 8
BUN: 37 mg/dL — ABNORMAL HIGH (ref 6–23)
Calcium, Ion: 1.21 mmol/L (ref 1.13–1.30)
Chloride: 105 mEq/L (ref 96–112)
Creatinine, Ser: 1.4 mg/dL — ABNORMAL HIGH (ref 0.50–1.10)
Glucose, Bld: 91 mg/dL (ref 70–99)
HCT: 38 % (ref 36.0–46.0)
Hemoglobin: 12.9 g/dL (ref 12.0–15.0)
Potassium: 4.5 mEq/L (ref 3.5–5.1)
Sodium: 138 mEq/L (ref 135–145)
TCO2: 23 mmol/L (ref 0–100)

## 2011-12-05 SURGERY — CARPAL TUNNEL RELEASE
Anesthesia: General | Site: Wrist | Laterality: Left | Wound class: Clean

## 2011-12-05 MED ORDER — FENTANYL CITRATE 0.05 MG/ML IJ SOLN
INTRAMUSCULAR | Status: DC | PRN
  Administered 2011-12-05: 25 ug via INTRAVENOUS

## 2011-12-05 MED ORDER — METOPROLOL TARTRATE 25 MG PO TABS
25.0000 mg | ORAL_TABLET | Freq: Once | ORAL | Status: AC
  Administered 2011-12-05: 25 mg via ORAL

## 2011-12-05 MED ORDER — LACTATED RINGERS IV SOLN
INTRAVENOUS | Status: DC
  Administered 2011-12-05: 08:00:00 via INTRAVENOUS

## 2011-12-05 MED ORDER — DEXAMETHASONE SODIUM PHOSPHATE 10 MG/ML IJ SOLN
INTRAMUSCULAR | Status: DC | PRN
  Administered 2011-12-05: 4 mg via INTRAVENOUS

## 2011-12-05 MED ORDER — CHLORHEXIDINE GLUCONATE 4 % EX LIQD: 60.0000 mL | Freq: Once | CUTANEOUS | Status: DC

## 2011-12-05 MED ORDER — ONDANSETRON HCL 4 MG/2ML IJ SOLN
INTRAMUSCULAR | Status: DC | PRN
  Administered 2011-12-05: 4 mg via INTRAVENOUS

## 2011-12-05 MED ORDER — METOPROLOL SUCCINATE ER 25 MG PO TB24: 25.0000 mg | ORAL_TABLET | Freq: Every day | ORAL | Status: DC

## 2011-12-05 MED ORDER — LIDOCAINE HCL 2 % IJ SOLN
INTRAMUSCULAR | Status: DC | PRN
  Administered 2011-12-05: 2.5 mL

## 2011-12-05 MED ORDER — LIDOCAINE HCL (CARDIAC) 20 MG/ML IV SOLN
INTRAVENOUS | Status: DC | PRN
  Administered 2011-12-05: 50 mg via INTRAVENOUS

## 2011-12-05 MED ORDER — PROPOFOL 10 MG/ML IV BOLUS
INTRAVENOUS | Status: DC | PRN
  Administered 2011-12-05: 60 mg via INTRAVENOUS

## 2011-12-05 SURGICAL SUPPLY — 39 items
BANDAGE ADHESIVE 1X3 (GAUZE/BANDAGES/DRESSINGS) IMPLANT
BANDAGE ELASTIC 3 VELCRO ST LF (GAUZE/BANDAGES/DRESSINGS) ×2 IMPLANT
BLADE SURG 15 STRL LF DISP TIS (BLADE) ×1 IMPLANT
BLADE SURG 15 STRL SS (BLADE) ×2
BNDG CMPR 9X4 STRL LF SNTH (GAUZE/BANDAGES/DRESSINGS) ×1
BNDG ESMARK 4X9 LF (GAUZE/BANDAGES/DRESSINGS) ×1 IMPLANT
BRUSH SCRUB EZ PLAIN DRY (MISCELLANEOUS) ×2 IMPLANT
CLOTH BEACON ORANGE TIMEOUT ST (SAFETY) ×2 IMPLANT
CORDS BIPOLAR (ELECTRODE) ×1 IMPLANT
COVER MAYO STAND STRL (DRAPES) ×2 IMPLANT
COVER TABLE BACK 60X90 (DRAPES) ×2 IMPLANT
CUFF TOURNIQUET SINGLE 18IN (TOURNIQUET CUFF) ×1 IMPLANT
DECANTER SPIKE VIAL GLASS SM (MISCELLANEOUS) IMPLANT
DRAPE EXTREMITY T 121X128X90 (DRAPE) ×2 IMPLANT
DRAPE SURG 17X23 STRL (DRAPES) ×2 IMPLANT
GLOVE BIO SURGEON STRL SZ7.5 (GLOVE) ×1 IMPLANT
GLOVE BIOGEL M STRL SZ7.5 (GLOVE) ×2 IMPLANT
GLOVE BIOGEL PI IND STRL 8 (GLOVE) IMPLANT
GLOVE BIOGEL PI INDICATOR 8 (GLOVE) ×1
GLOVE ORTHO TXT STRL SZ7.5 (GLOVE) ×2 IMPLANT
GOWN PREVENTION PLUS XLARGE (GOWN DISPOSABLE) ×1 IMPLANT
GOWN PREVENTION PLUS XXLARGE (GOWN DISPOSABLE) ×4 IMPLANT
NEEDLE 27GAX1X1/2 (NEEDLE) ×1 IMPLANT
PACK BASIN DAY SURGERY FS (CUSTOM PROCEDURE TRAY) ×2 IMPLANT
PAD CAST 3X4 CTTN HI CHSV (CAST SUPPLIES) ×1 IMPLANT
PADDING CAST ABS 4INX4YD NS (CAST SUPPLIES) ×1
PADDING CAST ABS COTTON 4X4 ST (CAST SUPPLIES) ×1 IMPLANT
PADDING CAST COTTON 3X4 STRL (CAST SUPPLIES) ×2
SPLINT PLASTER CAST XFAST 3X15 (CAST SUPPLIES) ×5 IMPLANT
SPLINT PLASTER XTRA FASTSET 3X (CAST SUPPLIES) ×5
SPONGE GAUZE 4X4 12PLY (GAUZE/BANDAGES/DRESSINGS) ×2 IMPLANT
STOCKINETTE 4X48 STRL (DRAPES) ×2 IMPLANT
STRIP CLOSURE SKIN 1/2X4 (GAUZE/BANDAGES/DRESSINGS) ×2 IMPLANT
SUT PROLENE 3 0 PS 2 (SUTURE) ×2 IMPLANT
SYR 3ML 23GX1 SAFETY (SYRINGE) IMPLANT
SYR CONTROL 10ML LL (SYRINGE) ×1 IMPLANT
TRAY DSU PREP LF (CUSTOM PROCEDURE TRAY) ×2 IMPLANT
UNDERPAD 30X30 INCONTINENT (UNDERPADS AND DIAPERS) ×2 IMPLANT
WATER STERILE IRR 1000ML POUR (IV SOLUTION) ×1 IMPLANT

## 2011-12-05 NOTE — Op Note (Signed)
[id number] 

## 2011-12-05 NOTE — Brief Op Note (Signed)
12/05/2011  [id number] AM  PATIENT:  Alan Mulder Rymer  78 y.o. female  PRE-OPERATIVE DIAGNOSIS:  SEVERE LEFT CARPAL TUNNEL SYNDROME   POST-OPERATIVE DIAGNOSIS:  SEVERE LEFT CARPAL TUNNEL SYNDROME   PROCEDURE:  Procedure(s) (LRB) with comments: CARPAL TUNNEL RELEASE (Left)  SURGEON:  Surgeon(s) and Role:    * Cammie Sickle., MD - Primary  PHYSICIAN ASSISTANT:   ASSISTANTS:nurse  ANESTHESIA:   general  EBL:     BLOOD ADMINISTERED:none  DRAINS: none   LOCAL MEDICATIONS USED:  LIDOCAINE   SPECIMEN:  No Specimen  DISPOSITION OF SPECIMEN:  N/A  COUNTS:  YES  TOURNIQUET:   Total Tourniquet Time Documented: Upper Arm (Left) - 11 minutes  DICTATION: .Other Dictation: Dictation Number [id number]  PLAN OF CARE: Discharge to home after PACU  PATIENT DISPOSITION:  PACU - hemodynamically stable.

## 2011-12-05 NOTE — Anesthesia Procedure Notes (Signed)
Procedure Name: LMA Insertion Date/Time: 12/05/2011 8:31 AM Performed by: Lois Ostrom D Pre-anesthesia Checklist: Patient identified, Emergency Drugs available, Suction available and Patient being monitored Patient Re-evaluated:Patient Re-evaluated prior to inductionOxygen Delivery Method: Circle System Utilized Preoxygenation: Pre-oxygenation with 100% oxygen Intubation Type: IV induction Ventilation: Mask ventilation without difficulty LMA: LMA inserted LMA Size: 4.0 Number of attempts: 1 Airway Equipment and Method: bite block Placement Confirmation: positive ETCO2 Tube secured with: Tape Dental Injury: Teeth and Oropharynx as per pre-operative assessment

## 2011-12-05 NOTE — Transfer of Care (Signed)
Immediate Anesthesia Transfer of Care Note  Patient: Julie Mcpherson  Procedure(s) Performed: Procedure(s) (LRB) with comments: CARPAL TUNNEL RELEASE (Left)  Patient Location: PACU  Anesthesia Type:General  Level of Consciousness: sedated  Airway & Oxygen Therapy: Patient Spontanous Breathing and Patient connected to face mask oxygen  Post-op Assessment: Report given to PACU RN and Post -op Vital signs reviewed and stable  Post vital signs: Reviewed and stable  Complications: No apparent anesthesia complications

## 2011-12-05 NOTE — H&P (Signed)
Julie Mcpherson is an 78 y.o. female.   Chief Complaint: Numbness and weak pinch left hand HPI:78 year old woman with DJD, chronic left carpal tunnel syndrome with thenar atrophy  Past Medical History  Diagnosis Date  . Hypertension   . GERD (gastroesophageal reflux disease)   . Arthritis   . Urinary frequency   . Diverticular disease   . Chronic kidney disease     stage 3 renal disease  . High cholesterol   . DVT (deep venous thrombosis) early 1980's    left medial upper thigh  . Blood transfusion   . Skin cancer of face   . DJD (degenerative joint disease)   . Overactive bladder   . Syncope secondary to post termination pauses 05/03/11    "passed out; first time ever"  . Atrial fibrillation   . Pacemaker   . Fracture, shoulder ~ 2003; 1997    left; right    Past Surgical History  Procedure Date  . Bladder suspension 1985  . Carpal tunnel release 04/18/2011    right; Procedure: CARPAL TUNNEL RELEASE;  Surgeon: Cammie Sickle., MD;  Location: [facility];  Service: Orthopedics;  Laterality: Right;  . Appendectomy ~ 1938  . Hernia repair 1986    "stomach"  . Total hip arthroplasty     "2 on right; 1 on left"  . Joint replacement     hips  . Cataract extraction w/ intraocular lens  implant, bilateral   . Skin cancer excision 2012    "twice"    History reviewed. No pertinent family history. Social History:  reports that she has never smoked. She has never used smokeless tobacco. She reports that she drinks alcohol. She reports that she does not use illicit drugs.  Allergies:  Allergies  Allergen Reactions  . Codeine Other (See Comments)    "can't sleep; I climb the walls"  . Statins Other (See Comments)    "ocular migraine headaches"  . Sulfa Antibiotics Anaphylaxis, Itching and Rash  . Tetanus Toxoids Other (See Comments)    "think it started turning red around where I got shot; they gave me small doses and I did fine"    Medications Prior to  Admission  Medication Sig Dispense Refill  . acetaminophen (TYLENOL) 500 MG tablet Take 500 mg by mouth 2 (two) times daily as needed. For pain      . aspirin 81 MG chewable tablet Chew 1 tablet (81 mg total) by mouth daily.  30 tablet  3  . calcium carbonate (OS-CAL) 600 MG TABS Take 600 mg by mouth 2 (two) times daily with a meal.      . cholecalciferol (VITAMIN D) 1000 UNITS tablet Take 1,000 Units by mouth daily.      . fish oil-omega-3 fatty acids 1000 MG capsule Take 1 g by mouth daily.      [name] losartan (COZAAR) 50 MG tablet Take 1 tablet (50 mg total) by mouth daily.  30 tablet  3  . metoprolol succinate (TOPROL-XL) 25 MG 24 hr tablet Take 25 mg by mouth daily.      . Multiple Vitamin (MULTIVITAMIN WITH MINERALS) TABS Take 1 tablet by mouth daily.      [name] omeprazole (PRILOSEC) 40 MG capsule Take 40 mg by mouth daily.      [name] oxybutynin (DITROPAN XL) 15 MG 24 hr tablet Take 15 mg by mouth daily.        Results for orders placed during the hospital  encounter of 12/05/11 (from the past 48 hour(s))  POCT I-STAT, CHEM 8     Status: Abnormal   Collection Time   12/05/11  7:47 AM      Component Value Range Comment   Sodium 138  135 - 145 mEq/L    Potassium 4.5  3.5 - 5.1 mEq/L    Chloride 105  96 - 112 mEq/L    BUN 37 (*) 6 - 23 mg/dL    Creatinine, Ser 1.40 (*) 0.50 - 1.10 mg/dL    Glucose, Bld 91  70 - 99 mg/dL    Calcium, Ion 1.21  1.13 - 1.30 mmol/L    TCO2 23  0 - 100 mmol/L    Hemoglobin 12.9  12.0 - 15.0 g/dL    HCT 38.0  36.0 - 46.0 %    No results found.  Review of Systems  Constitutional: Negative.   HENT: Negative.   Eyes: Negative.   Respiratory: Negative.   Cardiovascular:       Recent pacemaker placement  Gastrointestinal: Negative.   Genitourinary: Negative.   Musculoskeletal: Positive for joint pain.  Skin: Negative.   Neurological: Positive for tingling, sensory change and focal weakness.       Left thenar atrophy  Endo/Heme/Allergies: Negative.     Psychiatric/Behavioral: Negative.     Blood pressure 168/94, pulse 72, temperature 97.6 F (36.4 C), temperature source Oral, resp. rate 18, height 5\' 4"  (1.626 m), weight 71.668 kg (158 lb), SpO2 96.00%. Physical Exam  Constitutional: She appears well-developed and well-nourished.  HENT:  Head: Normocephalic and atraumatic.  Eyes: EOM are normal. Pupils are equal, round, and reactive to light.  Neck: Normal range of motion.  Cardiovascular: Normal rate and regular rhythm.   Respiratory: Effort normal and breath sounds normal.  GI: Soft.  Musculoskeletal: Normal range of motion.  Neurological: She is alert.       Marked loss of left median sensation  Skin: Skin is warm.  Psychiatric: She has a normal mood and affect. Her behavior is normal. Judgment and thought content normal.     Assessment/Plan Chronic left carpal tunnel syndrome  Release left transverse carpal ligament.  She know that she must expect recovery over six or more months.   Marrion Finan JR,Kadar Chance V 12/05/2011, 8:17 AM

## 2011-12-05 NOTE — Anesthesia Preprocedure Evaluation (Addendum)
Anesthesia Evaluation  Patient identified by MRN, date of birth, ID band Patient awake    Reviewed: Allergy & Precautions, H&P , NPO status , Patient's Chart, lab work & pertinent test results  History of Anesthesia Complications Negative for: history of anesthetic complications  Airway Mallampati: I TM Distance: >3 FB Neck ROM: Full    Dental  (+) Missing, Dental Advisory Given and Caps   Pulmonary neg pulmonary ROS,  breath sounds clear to auscultation  Pulmonary exam normal       Cardiovascular hypertension, Pt. on medications and Pt. on home beta blockers + dysrhythmias Atrial Fibrillation + pacemaker (for SSS) Rhythm:Regular Rate:Normal  4/13 ECHO: normal LVF, EF 55-60%, mild MS   Neuro/Psych negative neurological ROS     GI/Hepatic Neg liver ROS, GERD-  Medicated and Controlled,  Endo/Other  negative endocrine ROS  Renal/GU Renal InsufficiencyRenal disease (creat 1.4)     Musculoskeletal   Abdominal   Peds  Hematology negative hematology ROS (+)   Anesthesia Other Findings   Reproductive/Obstetrics                          Anesthesia Physical Anesthesia Plan  ASA: III  Anesthesia Plan: General   Post-op Pain Management:    Induction: Intravenous  Airway Management Planned: LMA  Additional Equipment:   Intra-op Plan:   Post-operative Plan:   Informed Consent: I have reviewed the patients History and Physical, chart, labs and discussed the procedure including the risks, benefits and alternatives for the proposed anesthesia with the patient or authorized representative who has indicated his/her understanding and acceptance.   Dental advisory given  Plan Discussed with: CRNA and Surgeon  Anesthesia Plan Comments: (Plan routine monitors, GA- LMA OK)        Anesthesia Quick Evaluation

## 2011-12-05 NOTE — Anesthesia Postprocedure Evaluation (Signed)
  Anesthesia Post-op Note  Patient: Julie Mcpherson  Procedure(s) Performed: Procedure(s) (LRB) with comments: CARPAL TUNNEL RELEASE (Left)  Patient Location: PACU  Anesthesia Type:General  Level of Consciousness: awake, alert , oriented and patient cooperative  Airway and Oxygen Therapy: Patient Spontanous Breathing  Post-op Pain: none  Post-op Assessment: Post-op Vital signs reviewed, Patient's Cardiovascular Status Stable, Respiratory Function Stable, Patent Airway, No signs of Nausea or vomiting and Pain level controlled  Post-op Vital Signs: Reviewed and stable  Complications: No apparent anesthesia complications

## 2011-12-08 ENCOUNTER — Encounter (HOSPITAL_BASED_OUTPATIENT_CLINIC_OR_DEPARTMENT_OTHER): Payer: Self-pay | Admitting: Orthopedic Surgery

## 2011-12-10 NOTE — Op Note (Signed)
Julie Mcpherson, Julie Mcpherson NO.:  [id number]  MEDICAL RECORD NO.:  [id number]  LOCATION:                                 FACILITY:  PHYSICIAN:  Youlanda Mighty. Aleeta Schmaltz, M.D. DATE OF BIRTH:  February 20, 1913  DATE OF PROCEDURE:  12/05/2011 DATE OF DISCHARGE:                              OPERATIVE REPORT   PREOPERATIVE DIAGNOSIS:  Severe chronic median entrapment neuropathy, left carpal tunnel.  POSTOPERATIVE DIAGNOSIS:  Severe chronic median entrapment neuropathy, left carpal tunnel.  OPERATION:  Release of left transverse carpal ligament.  OPERATING SURGEON:  Youlanda Mighty. Konstantin Lehnen, MD  ASSISTANT:  None.  ANESTHESIA:  General by LMA.  SUPERVISING ANESTHESIOLOGIST:  Midge Minium, MD  INDICATIONS:  Julie Mcpherson is a 78 year old woman referred by Dr. Felipa Eth for management of bilateral hand numbness.  She has background osteoarthritis consistent with her chronological age of 57. She has had bilateral carpal tunnel syndrome treated in the past with release of her right transcarpal ligament with good results.  She returned with numbness and thenar atrophy on the left.  She was originally scheduled for surgery several months past but due to the onset of atrial fibrillation and a diminished rate, she had pacemaker placement by Dr. Terrence Dupont.  She now presents for release of her left transverse carpal ligament.  Preoperatively, she was interviewed by Dr. Glennon Mac of Anesthesia.  Her medical record was carefully examined and she was offered general anesthesia versus monitored anesthesia care.  She selected general anesthesia.  She was transferred to room 1 of the [facility] where under Dr. Marisue Brooklyn direct supervision, general anesthesia by LMA technique was induced.  The left hand and arm were prepped with Betadine soap and solution, sterilely draped.  A pneumatic tourniquet was applied to the proximal left brachium and set at 250 mmHg.  Following exsanguination  of the left arm with an Esmarch bandage, the arterial tourniquet was inflated to 250 mmHg.  Following a routine surgical time-out, procedure commenced with a short incision in the line of the ring finger in the palm. Subcutaneous tissues were carefully divided revealing the palmar fascia. This was split longitudinally to reveal the common sensory branches of the median nerve and superficial palmar arch.  The common sensory branch of the median nerve were followed back to the median nerve proper which was gently isolated from the transverse carpal ligament with the [facility] 4 elevator.  The ulnar bursa was very fibrotic and thickened.  The transverse carpal ligament was released along its ulnar border extending into the distal forearm.  This widely opened the carpal canal.  Bleeding points along the margin of the released ligament and the subdermal plexus were electrocauterized with bipolar forceps.  The wound was then repaired with intradermal 3-0 Prolene suture.  A compressive dressing was applied with a volar plaster splint, maintaining the wrist in 15 degrees of dorsiflexion.  For aftercare, [name] has pain medication at home.  She is quite tolerant of discomfort and uses Tylenol as her primary analgesic.  She does have prescription for stronger analgesic.     Youlanda Mighty Tiffany Calmes, M.D.     RVS/MEDQ  D:  12/05/2011  T:  12/05/2011  Job:  [id number]

## 2011-12-23 DIAGNOSIS — D485 Neoplasm of uncertain behavior of skin: Secondary | ICD-10-CM | POA: Diagnosis not present

## 2011-12-23 DIAGNOSIS — L821 Other seborrheic keratosis: Secondary | ICD-10-CM | POA: Diagnosis not present

## 2011-12-23 DIAGNOSIS — Z85828 Personal history of other malignant neoplasm of skin: Secondary | ICD-10-CM | POA: Diagnosis not present

## 2011-12-23 DIAGNOSIS — D2310 Other benign neoplasm of skin of unspecified eyelid, including canthus: Secondary | ICD-10-CM | POA: Diagnosis not present

## 2012-02-18 DIAGNOSIS — L03039 Cellulitis of unspecified toe: Secondary | ICD-10-CM | POA: Diagnosis not present

## 2012-02-18 DIAGNOSIS — L02619 Cutaneous abscess of unspecified foot: Secondary | ICD-10-CM | POA: Diagnosis not present

## 2012-02-19 DIAGNOSIS — I129 Hypertensive chronic kidney disease with stage 1 through stage 4 chronic kidney disease, or unspecified chronic kidney disease: Secondary | ICD-10-CM | POA: Diagnosis not present

## 2012-02-19 DIAGNOSIS — Z79899 Other long term (current) drug therapy: Secondary | ICD-10-CM | POA: Diagnosis not present

## 2012-02-19 DIAGNOSIS — N183 Chronic kidney disease, stage 3 unspecified: Secondary | ICD-10-CM | POA: Diagnosis not present

## 2012-02-19 DIAGNOSIS — Z1331 Encounter for screening for depression: Secondary | ICD-10-CM | POA: Diagnosis not present

## 2012-02-19 DIAGNOSIS — Z0000 Encounter for general adult medical examination without abnormal findings: Secondary | ICD-10-CM | POA: Diagnosis not present

## 2012-02-24 DIAGNOSIS — B351 Tinea unguium: Secondary | ICD-10-CM | POA: Diagnosis not present

## 2012-02-24 DIAGNOSIS — M79609 Pain in unspecified limb: Secondary | ICD-10-CM | POA: Diagnosis not present

## 2012-02-27 DIAGNOSIS — I4891 Unspecified atrial fibrillation: Secondary | ICD-10-CM | POA: Diagnosis not present

## 2012-02-27 DIAGNOSIS — R55 Syncope and collapse: Secondary | ICD-10-CM | POA: Diagnosis not present

## 2012-02-27 DIAGNOSIS — E7800 Pure hypercholesterolemia, unspecified: Secondary | ICD-10-CM | POA: Diagnosis not present

## 2012-02-27 DIAGNOSIS — I10 Essential (primary) hypertension: Secondary | ICD-10-CM | POA: Diagnosis not present

## 2012-03-25 DIAGNOSIS — M25559 Pain in unspecified hip: Secondary | ICD-10-CM | POA: Diagnosis not present

## 2012-03-25 DIAGNOSIS — I129 Hypertensive chronic kidney disease with stage 1 through stage 4 chronic kidney disease, or unspecified chronic kidney disease: Secondary | ICD-10-CM | POA: Diagnosis not present

## 2012-03-25 DIAGNOSIS — N183 Chronic kidney disease, stage 3 unspecified: Secondary | ICD-10-CM | POA: Diagnosis not present

## 2012-04-07 DIAGNOSIS — M76899 Other specified enthesopathies of unspecified lower limb, excluding foot: Secondary | ICD-10-CM | POA: Diagnosis not present

## 2012-05-03 DIAGNOSIS — N183 Chronic kidney disease, stage 3 unspecified: Secondary | ICD-10-CM | POA: Diagnosis not present

## 2012-05-03 DIAGNOSIS — H00019 Hordeolum externum unspecified eye, unspecified eyelid: Secondary | ICD-10-CM | POA: Diagnosis not present

## 2012-05-03 DIAGNOSIS — I129 Hypertensive chronic kidney disease with stage 1 through stage 4 chronic kidney disease, or unspecified chronic kidney disease: Secondary | ICD-10-CM | POA: Diagnosis not present

## 2012-05-31 DIAGNOSIS — I10 Essential (primary) hypertension: Secondary | ICD-10-CM | POA: Diagnosis not present

## 2012-05-31 DIAGNOSIS — E7800 Pure hypercholesterolemia, unspecified: Secondary | ICD-10-CM | POA: Diagnosis not present

## 2012-05-31 DIAGNOSIS — N189 Chronic kidney disease, unspecified: Secondary | ICD-10-CM | POA: Diagnosis not present

## 2012-05-31 DIAGNOSIS — I495 Sick sinus syndrome: Secondary | ICD-10-CM | POA: Diagnosis not present

## 2012-06-22 ENCOUNTER — Encounter: Payer: Self-pay | Admitting: Internal Medicine

## 2012-06-22 ENCOUNTER — Ambulatory Visit (INDEPENDENT_AMBULATORY_CARE_PROVIDER_SITE_OTHER): Payer: Medicare Other | Admitting: Internal Medicine

## 2012-06-22 VITALS — BP 174/54 | HR 67 | Ht 64.0 in | Wt 159.1 lb

## 2012-06-22 DIAGNOSIS — I495 Sick sinus syndrome: Secondary | ICD-10-CM

## 2012-06-22 DIAGNOSIS — I4891 Unspecified atrial fibrillation: Secondary | ICD-10-CM

## 2012-06-22 DIAGNOSIS — Z950 Presence of cardiac pacemaker: Secondary | ICD-10-CM

## 2012-06-22 LAB — PACEMAKER DEVICE OBSERVATION
AL AMPLITUDE: 3.2 mv
AL IMPEDENCE PM: 362.5 Ohm
AL THRESHOLD: 0.75 V
ATRIAL PACING PM: 79
BAMS-0001: 150 {beats}/min
BAMS-0003: 70 {beats}/min
BATTERY VOLTAGE: 2.9478 V
DEVICE MODEL PM: 7317017
RV LEAD AMPLITUDE: 12 mv
RV LEAD IMPEDENCE PM: 662.5 Ohm
RV LEAD THRESHOLD: 0.75 V
VENTRICULAR PACING PM: 1

## 2012-06-22 NOTE — Assessment & Plan Note (Signed)
The patient's device was interrogated.  The information was reviewed. No changes were made in the programming.    

## 2012-06-22 NOTE — Progress Notes (Signed)
Patient Care Team: Hal Rolm Gala, MD as PCP - General (Internal Medicine)   HPI  Julie Mcpherson is a 79 y.o. female Seen following pacemaker implantation April 2013 for atrial fibrillation with post termination pauses.  She is doing much better without chest pain or shortness of breath or lightheadedness  She is asking about treport from motorized m t wheelchair of   Past Medical History  Diagnosis Date  . Hypertension   . GERD (gastroesophageal reflux disease)   . Arthritis   . Urinary frequency   . Diverticular disease   . Chronic kidney disease     stage 3 renal disease  . High cholesterol   . DVT (deep venous thrombosis) early 1980's    left medial upper thigh  . Blood transfusion   . Skin cancer of face   . DJD (degenerative joint disease)   . Overactive bladder   . Syncope secondary to post termination pauses 05/03/11    "passed out; first time ever"  . Atrial fibrillation   . Pacemaker   . Fracture, shoulder ~ 2003; 1997    left; right    Past Surgical History  Procedure Laterality Date  . Bladder suspension  1985  . Carpal tunnel release  04/18/2011    right; Procedure: CARPAL TUNNEL RELEASE;  Surgeon: Cammie Sickle., MD;  Location: [place];  Service: Orthopedics;  Laterality: Right;  . Appendectomy  ~ 1938  . Hernia repair  1986    "stomach"  . Total hip arthroplasty      "2 on right; 1 on left"  . Joint replacement      hips  . Cataract extraction w/ intraocular lens  implant, bilateral    . Skin cancer excision  2012    "twice"  . Carpal tunnel release  12/05/2011    Procedure: CARPAL TUNNEL RELEASE;  Surgeon: Cammie Sickle., MD;  Location: [facility];  Service: Orthopedics;  Laterality: Left;    Current Outpatient Prescriptions  Medication Sig Dispense Refill  . acetaminophen (TYLENOL) 500 MG tablet Take 500 mg by mouth 2 (two) times daily as needed. For pain      . calcium carbonate (OS-CAL) 600 MG  TABS Take 600 mg by mouth 2 (two) times daily with a meal.      . cholecalciferol (VITAMIN D) 1000 UNITS tablet Take 1,000 Units by mouth daily.      . fish oil-omega-3 fatty acids 1000 MG capsule Take 1 g by mouth daily.      . metoprolol succinate (TOPROL-XL) 25 MG 24 hr tablet Take 25 mg by mouth daily.      . Multiple Vitamin (MULTIVITAMIN WITH MINERALS) TABS Take 1 tablet by mouth daily.      [name] omeprazole (PRILOSEC) 40 MG capsule Take 40 mg by mouth daily.      [name] oxybutynin (DITROPAN XL) 15 MG 24 hr tablet Take 15 mg by mouth daily.       No current facility-administered medications for this visit.    Allergies  Allergen Reactions  . Codeine Other (See Comments)    "can't sleep; I climb the walls"  . Statins Other (See Comments)    "ocular migraine headaches"  . Sulfa Antibiotics Anaphylaxis, Itching and Rash  . Tetanus Toxoids Other (See Comments)    "think it started turning red around where I got shot; they gave me small doses and I did fine"    Review of Systems negative  except from HPI and PMH  Physical Exam BP 174/54  Pulse 67  Ht 5\' 4"  (1.626 m)  Wt 159 lb 1.9 oz (72.176 kg)  BMI 27.3 kg/m2 Well developed and well nourished in no acute distress HENT normal E scleral and icterus clear Neck Supple Clear to ausculation Device pocket well healed; without hematoma or erythema Regular rate and rhythm, no murmurs gallops or rub Soft with active bowel sounds No clubbing cyanosis none Edema Alert and oriented, grossly normal motor and sensory function walking with a walker Skin Warm and Dry    Assessment and  Plan

## 2012-06-22 NOTE — Patient Instructions (Signed)
Remote monitoring is used to monitor your Pacemaker of ICD from home. This monitoring reduces the number of office visits required to check your device to one time per year. It allows [place] to keep an eye on the functioning of your device to ensure it is working properly. You are scheduled for a device check from home on 09/27/12. You may send your transmission at any time that day. If you have a wireless device, the transmission will be sent automatically. After your physician reviews your transmission, you will receive a postcard with your next transmission date.  Your physician wants you to follow-up in: 1 year with Dr. Caryl Comes. You will receive a reminder letter in the mail two months in advance. If you don't receive a letter, please call our office to schedule the follow-up appointment.  Your physician recommends that you continue on your current medications as directed. Please refer to the Current Medication list given to you today.

## 2012-06-22 NOTE — Assessment & Plan Note (Signed)
Q. The 85% atrially paced and has 15% atrial fibrillation

## 2012-06-22 NOTE — Assessment & Plan Note (Signed)
She is about 15% atrial fibrillation. We'll forward this to her primary cardiologist for consideration of anticoagulation

## 2012-08-12 DIAGNOSIS — M25569 Pain in unspecified knee: Secondary | ICD-10-CM | POA: Diagnosis not present

## 2012-08-12 DIAGNOSIS — I129 Hypertensive chronic kidney disease with stage 1 through stage 4 chronic kidney disease, or unspecified chronic kidney disease: Secondary | ICD-10-CM | POA: Diagnosis not present

## 2012-08-12 DIAGNOSIS — G43809 Other migraine, not intractable, without status migrainosus: Secondary | ICD-10-CM | POA: Diagnosis not present

## 2012-08-12 DIAGNOSIS — N183 Chronic kidney disease, stage 3 unspecified: Secondary | ICD-10-CM | POA: Diagnosis not present

## 2012-08-12 DIAGNOSIS — Z79899 Other long term (current) drug therapy: Secondary | ICD-10-CM | POA: Diagnosis not present

## 2012-08-12 DIAGNOSIS — M7130 Other bursal cyst, unspecified site: Secondary | ICD-10-CM | POA: Diagnosis not present

## 2012-08-12 DIAGNOSIS — M76899 Other specified enthesopathies of unspecified lower limb, excluding foot: Secondary | ICD-10-CM | POA: Diagnosis not present

## 2012-08-16 DIAGNOSIS — H3530 Unspecified macular degeneration: Secondary | ICD-10-CM | POA: Diagnosis not present

## 2012-08-16 DIAGNOSIS — Z961 Presence of intraocular lens: Secondary | ICD-10-CM | POA: Diagnosis not present

## 2012-08-16 DIAGNOSIS — H04129 Dry eye syndrome of unspecified lacrimal gland: Secondary | ICD-10-CM | POA: Diagnosis not present

## 2012-08-19 DIAGNOSIS — M1710 Unilateral primary osteoarthritis, unspecified knee: Secondary | ICD-10-CM | POA: Diagnosis not present

## 2012-08-19 DIAGNOSIS — M76899 Other specified enthesopathies of unspecified lower limb, excluding foot: Secondary | ICD-10-CM | POA: Diagnosis not present

## 2012-08-19 DIAGNOSIS — M25559 Pain in unspecified hip: Secondary | ICD-10-CM | POA: Diagnosis not present

## 2012-08-26 DIAGNOSIS — H43819 Vitreous degeneration, unspecified eye: Secondary | ICD-10-CM | POA: Diagnosis not present

## 2012-08-26 DIAGNOSIS — H353290 Exudative age-related macular degeneration, unspecified eye, stage unspecified: Secondary | ICD-10-CM | POA: Diagnosis not present

## 2012-08-26 DIAGNOSIS — H353190 Nonexudative age-related macular degeneration, unspecified eye, stage unspecified: Secondary | ICD-10-CM | POA: Diagnosis not present

## 2012-08-30 DIAGNOSIS — N189 Chronic kidney disease, unspecified: Secondary | ICD-10-CM | POA: Diagnosis not present

## 2012-08-30 DIAGNOSIS — E7800 Pure hypercholesterolemia, unspecified: Secondary | ICD-10-CM | POA: Diagnosis not present

## 2012-08-30 DIAGNOSIS — I10 Essential (primary) hypertension: Secondary | ICD-10-CM | POA: Diagnosis not present

## 2012-09-14 DIAGNOSIS — Z5189 Encounter for other specified aftercare: Secondary | ICD-10-CM | POA: Diagnosis not present

## 2012-09-14 DIAGNOSIS — Z4689 Encounter for fitting and adjustment of other specified devices: Secondary | ICD-10-CM | POA: Diagnosis not present

## 2012-09-14 DIAGNOSIS — M6281 Muscle weakness (generalized): Secondary | ICD-10-CM | POA: Diagnosis not present

## 2012-09-15 DIAGNOSIS — Z4689 Encounter for fitting and adjustment of other specified devices: Secondary | ICD-10-CM | POA: Diagnosis not present

## 2012-09-15 DIAGNOSIS — M6281 Muscle weakness (generalized): Secondary | ICD-10-CM | POA: Diagnosis not present

## 2012-09-15 DIAGNOSIS — Z5189 Encounter for other specified aftercare: Secondary | ICD-10-CM | POA: Diagnosis not present

## 2012-09-16 DIAGNOSIS — Z5189 Encounter for other specified aftercare: Secondary | ICD-10-CM | POA: Diagnosis not present

## 2012-09-16 DIAGNOSIS — M6281 Muscle weakness (generalized): Secondary | ICD-10-CM | POA: Diagnosis not present

## 2012-09-16 DIAGNOSIS — Z4689 Encounter for fitting and adjustment of other specified devices: Secondary | ICD-10-CM | POA: Diagnosis not present

## 2012-09-17 DIAGNOSIS — M6281 Muscle weakness (generalized): Secondary | ICD-10-CM | POA: Diagnosis not present

## 2012-09-17 DIAGNOSIS — Z4689 Encounter for fitting and adjustment of other specified devices: Secondary | ICD-10-CM | POA: Diagnosis not present

## 2012-09-17 DIAGNOSIS — Z5189 Encounter for other specified aftercare: Secondary | ICD-10-CM | POA: Diagnosis not present

## 2012-09-20 DIAGNOSIS — Z5189 Encounter for other specified aftercare: Secondary | ICD-10-CM | POA: Diagnosis not present

## 2012-09-20 DIAGNOSIS — Z4689 Encounter for fitting and adjustment of other specified devices: Secondary | ICD-10-CM | POA: Diagnosis not present

## 2012-09-20 DIAGNOSIS — M6281 Muscle weakness (generalized): Secondary | ICD-10-CM | POA: Diagnosis not present

## 2012-09-21 DIAGNOSIS — Z5189 Encounter for other specified aftercare: Secondary | ICD-10-CM | POA: Diagnosis not present

## 2012-09-21 DIAGNOSIS — Z4689 Encounter for fitting and adjustment of other specified devices: Secondary | ICD-10-CM | POA: Diagnosis not present

## 2012-09-21 DIAGNOSIS — M6281 Muscle weakness (generalized): Secondary | ICD-10-CM | POA: Diagnosis not present

## 2012-09-22 DIAGNOSIS — M6281 Muscle weakness (generalized): Secondary | ICD-10-CM | POA: Diagnosis not present

## 2012-09-22 DIAGNOSIS — Z4689 Encounter for fitting and adjustment of other specified devices: Secondary | ICD-10-CM | POA: Diagnosis not present

## 2012-09-22 DIAGNOSIS — Z5189 Encounter for other specified aftercare: Secondary | ICD-10-CM | POA: Diagnosis not present

## 2012-09-23 DIAGNOSIS — Z4689 Encounter for fitting and adjustment of other specified devices: Secondary | ICD-10-CM | POA: Diagnosis not present

## 2012-09-23 DIAGNOSIS — M6281 Muscle weakness (generalized): Secondary | ICD-10-CM | POA: Diagnosis not present

## 2012-09-23 DIAGNOSIS — H353290 Exudative age-related macular degeneration, unspecified eye, stage unspecified: Secondary | ICD-10-CM | POA: Diagnosis not present

## 2012-09-23 DIAGNOSIS — Z5189 Encounter for other specified aftercare: Secondary | ICD-10-CM | POA: Diagnosis not present

## 2012-09-24 DIAGNOSIS — Z5189 Encounter for other specified aftercare: Secondary | ICD-10-CM | POA: Diagnosis not present

## 2012-09-24 DIAGNOSIS — Z4689 Encounter for fitting and adjustment of other specified devices: Secondary | ICD-10-CM | POA: Diagnosis not present

## 2012-09-24 DIAGNOSIS — M6281 Muscle weakness (generalized): Secondary | ICD-10-CM | POA: Diagnosis not present

## 2012-09-27 ENCOUNTER — Ambulatory Visit (INDEPENDENT_AMBULATORY_CARE_PROVIDER_SITE_OTHER): Payer: Medicare Other | Admitting: *Deleted

## 2012-09-27 ENCOUNTER — Encounter: Payer: Self-pay | Admitting: Internal Medicine

## 2012-09-27 DIAGNOSIS — Z4689 Encounter for fitting and adjustment of other specified devices: Secondary | ICD-10-CM | POA: Diagnosis not present

## 2012-09-27 DIAGNOSIS — Z950 Presence of cardiac pacemaker: Secondary | ICD-10-CM

## 2012-09-27 DIAGNOSIS — I495 Sick sinus syndrome: Secondary | ICD-10-CM | POA: Diagnosis not present

## 2012-09-27 DIAGNOSIS — M6281 Muscle weakness (generalized): Secondary | ICD-10-CM | POA: Diagnosis not present

## 2012-09-27 DIAGNOSIS — Z5189 Encounter for other specified aftercare: Secondary | ICD-10-CM | POA: Diagnosis not present

## 2012-09-28 DIAGNOSIS — Z4689 Encounter for fitting and adjustment of other specified devices: Secondary | ICD-10-CM | POA: Diagnosis not present

## 2012-09-28 DIAGNOSIS — M6281 Muscle weakness (generalized): Secondary | ICD-10-CM | POA: Diagnosis not present

## 2012-09-28 DIAGNOSIS — Z5189 Encounter for other specified aftercare: Secondary | ICD-10-CM | POA: Diagnosis not present

## 2012-09-28 LAB — REMOTE PACEMAKER DEVICE
AL AMPLITUDE: 4.2 mv
AL IMPEDENCE PM: 410 Ohm
AL THRESHOLD: 0.75 V
ATRIAL PACING PM: 74
BAMS-0001: 150 {beats}/min
BAMS-0003: 70 {beats}/min
BATTERY VOLTAGE: 2.95 V
DEVICE MODEL PM: 7317017
RV LEAD AMPLITUDE: 12 mv
RV LEAD IMPEDENCE PM: 740 Ohm
RV LEAD THRESHOLD: 0.625 V
VENTRICULAR PACING PM: 1

## 2012-09-29 DIAGNOSIS — M6281 Muscle weakness (generalized): Secondary | ICD-10-CM | POA: Diagnosis not present

## 2012-09-29 DIAGNOSIS — Z4689 Encounter for fitting and adjustment of other specified devices: Secondary | ICD-10-CM | POA: Diagnosis not present

## 2012-09-29 DIAGNOSIS — Z5189 Encounter for other specified aftercare: Secondary | ICD-10-CM | POA: Diagnosis not present

## 2012-09-30 DIAGNOSIS — M6281 Muscle weakness (generalized): Secondary | ICD-10-CM | POA: Diagnosis not present

## 2012-09-30 DIAGNOSIS — Z4689 Encounter for fitting and adjustment of other specified devices: Secondary | ICD-10-CM | POA: Diagnosis not present

## 2012-09-30 DIAGNOSIS — Z5189 Encounter for other specified aftercare: Secondary | ICD-10-CM | POA: Diagnosis not present

## 2012-10-21 DIAGNOSIS — H353290 Exudative age-related macular degeneration, unspecified eye, stage unspecified: Secondary | ICD-10-CM | POA: Diagnosis not present

## 2012-10-27 ENCOUNTER — Encounter: Payer: Self-pay | Admitting: *Deleted

## 2012-11-29 DIAGNOSIS — N189 Chronic kidney disease, unspecified: Secondary | ICD-10-CM | POA: Diagnosis not present

## 2012-11-29 DIAGNOSIS — E7800 Pure hypercholesterolemia, unspecified: Secondary | ICD-10-CM | POA: Diagnosis not present

## 2012-11-29 DIAGNOSIS — I10 Essential (primary) hypertension: Secondary | ICD-10-CM | POA: Diagnosis not present

## 2012-11-29 DIAGNOSIS — K219 Gastro-esophageal reflux disease without esophagitis: Secondary | ICD-10-CM | POA: Diagnosis not present

## 2012-11-29 DIAGNOSIS — I495 Sick sinus syndrome: Secondary | ICD-10-CM | POA: Diagnosis not present

## 2012-11-30 DIAGNOSIS — H353290 Exudative age-related macular degeneration, unspecified eye, stage unspecified: Secondary | ICD-10-CM | POA: Diagnosis not present

## 2012-12-02 DIAGNOSIS — G479 Sleep disorder, unspecified: Secondary | ICD-10-CM | POA: Diagnosis not present

## 2012-12-02 DIAGNOSIS — I129 Hypertensive chronic kidney disease with stage 1 through stage 4 chronic kidney disease, or unspecified chronic kidney disease: Secondary | ICD-10-CM | POA: Diagnosis not present

## 2012-12-02 DIAGNOSIS — R109 Unspecified abdominal pain: Secondary | ICD-10-CM | POA: Diagnosis not present

## 2012-12-02 DIAGNOSIS — N183 Chronic kidney disease, stage 3 unspecified: Secondary | ICD-10-CM | POA: Diagnosis not present

## 2012-12-09 DIAGNOSIS — H539 Unspecified visual disturbance: Secondary | ICD-10-CM | POA: Diagnosis not present

## 2012-12-09 DIAGNOSIS — R51 Headache: Secondary | ICD-10-CM | POA: Diagnosis not present

## 2012-12-21 DIAGNOSIS — L219 Seborrheic dermatitis, unspecified: Secondary | ICD-10-CM | POA: Diagnosis not present

## 2012-12-21 DIAGNOSIS — D485 Neoplasm of uncertain behavior of skin: Secondary | ICD-10-CM | POA: Diagnosis not present

## 2012-12-21 DIAGNOSIS — C44319 Basal cell carcinoma of skin of other parts of face: Secondary | ICD-10-CM | POA: Diagnosis not present

## 2012-12-21 DIAGNOSIS — Z85828 Personal history of other malignant neoplasm of skin: Secondary | ICD-10-CM | POA: Diagnosis not present

## 2012-12-23 DIAGNOSIS — H353190 Nonexudative age-related macular degeneration, unspecified eye, stage unspecified: Secondary | ICD-10-CM | POA: Diagnosis not present

## 2012-12-23 DIAGNOSIS — H353290 Exudative age-related macular degeneration, unspecified eye, stage unspecified: Secondary | ICD-10-CM | POA: Diagnosis not present

## 2012-12-23 DIAGNOSIS — H43819 Vitreous degeneration, unspecified eye: Secondary | ICD-10-CM | POA: Diagnosis not present

## 2013-01-03 ENCOUNTER — Encounter: Payer: Self-pay | Admitting: Internal Medicine

## 2013-01-03 ENCOUNTER — Encounter (INDEPENDENT_AMBULATORY_CARE_PROVIDER_SITE_OTHER): Payer: Medicare Other | Admitting: *Deleted

## 2013-01-03 DIAGNOSIS — I495 Sick sinus syndrome: Secondary | ICD-10-CM

## 2013-01-03 DIAGNOSIS — Z950 Presence of cardiac pacemaker: Secondary | ICD-10-CM

## 2013-01-03 LAB — MDC_IDC_ENUM_SESS_TYPE_REMOTE
Implantable Pulse Generator Model: 2110
Implantable Pulse Generator Serial Number: 7317017

## 2013-01-09 ENCOUNTER — Other Ambulatory Visit: Payer: Self-pay | Admitting: Internal Medicine

## 2013-01-10 DIAGNOSIS — M1710 Unilateral primary osteoarthritis, unspecified knee: Secondary | ICD-10-CM | POA: Diagnosis not present

## 2013-01-10 DIAGNOSIS — M25559 Pain in unspecified hip: Secondary | ICD-10-CM | POA: Diagnosis not present

## 2013-01-10 DIAGNOSIS — M76899 Other specified enthesopathies of unspecified lower limb, excluding foot: Secondary | ICD-10-CM | POA: Diagnosis not present

## 2013-01-21 ENCOUNTER — Encounter: Payer: Self-pay | Admitting: *Deleted

## 2013-02-17 DIAGNOSIS — H353290 Exudative age-related macular degeneration, unspecified eye, stage unspecified: Secondary | ICD-10-CM | POA: Diagnosis not present

## 2013-02-24 ENCOUNTER — Other Ambulatory Visit: Payer: Self-pay | Admitting: Geriatric Medicine

## 2013-02-24 DIAGNOSIS — N183 Chronic kidney disease, stage 3 unspecified: Secondary | ICD-10-CM | POA: Diagnosis not present

## 2013-02-24 DIAGNOSIS — I129 Hypertensive chronic kidney disease with stage 1 through stage 4 chronic kidney disease, or unspecified chronic kidney disease: Secondary | ICD-10-CM | POA: Diagnosis not present

## 2013-02-24 DIAGNOSIS — Z79899 Other long term (current) drug therapy: Secondary | ICD-10-CM | POA: Diagnosis not present

## 2013-02-24 DIAGNOSIS — R109 Unspecified abdominal pain: Secondary | ICD-10-CM

## 2013-02-24 DIAGNOSIS — Z1331 Encounter for screening for depression: Secondary | ICD-10-CM | POA: Diagnosis not present

## 2013-02-24 DIAGNOSIS — Z0000 Encounter for general adult medical examination without abnormal findings: Secondary | ICD-10-CM | POA: Diagnosis not present

## 2013-02-28 ENCOUNTER — Ambulatory Visit
Admission: RE | Admit: 2013-02-28 | Discharge: 2013-02-28 | Disposition: A | Discharge status: Home / Self Care | Payer: Medicare Other | Source: Ambulatory Visit | Attending: Geriatric Medicine | Admitting: Geriatric Medicine

## 2013-02-28 DIAGNOSIS — J8410 Pulmonary fibrosis, unspecified: Secondary | ICD-10-CM | POA: Diagnosis not present

## 2013-02-28 DIAGNOSIS — R109 Unspecified abdominal pain: Secondary | ICD-10-CM

## 2013-02-28 MED ORDER — IOHEXOL 300 MG/ML  SOLN
100.0000 mL | Freq: Once | INTRAMUSCULAR | Status: AC | PRN
  Administered 2013-02-28: 100 mL via INTRAVENOUS

## 2013-03-28 DIAGNOSIS — E7800 Pure hypercholesterolemia, unspecified: Secondary | ICD-10-CM | POA: Diagnosis not present

## 2013-03-28 DIAGNOSIS — I4891 Unspecified atrial fibrillation: Secondary | ICD-10-CM | POA: Diagnosis not present

## 2013-03-28 DIAGNOSIS — N189 Chronic kidney disease, unspecified: Secondary | ICD-10-CM | POA: Diagnosis not present

## 2013-03-28 DIAGNOSIS — K219 Gastro-esophageal reflux disease without esophagitis: Secondary | ICD-10-CM | POA: Diagnosis not present

## 2013-03-28 DIAGNOSIS — I10 Essential (primary) hypertension: Secondary | ICD-10-CM | POA: Diagnosis not present

## 2013-04-06 ENCOUNTER — Ambulatory Visit (INDEPENDENT_AMBULATORY_CARE_PROVIDER_SITE_OTHER): Payer: Medicare Other | Admitting: *Deleted

## 2013-04-06 ENCOUNTER — Encounter: Payer: Self-pay | Admitting: Internal Medicine

## 2013-04-06 DIAGNOSIS — Z950 Presence of cardiac pacemaker: Secondary | ICD-10-CM

## 2013-04-06 DIAGNOSIS — I495 Sick sinus syndrome: Secondary | ICD-10-CM | POA: Diagnosis not present

## 2013-04-06 LAB — MDC_IDC_ENUM_SESS_TYPE_REMOTE
Battery Remaining Longevity: 86 mo
Battery Voltage: 2.93 V
Brady Statistic AP VP Percent: 1 %
Brady Statistic AP VS Percent: 76 %
Brady Statistic AS VP Percent: 1 %
Brady Statistic AS VS Percent: 24 %
Brady Statistic RA Percent Paced: 73 %
Brady Statistic RV Percent Paced: 1 %
Date Time Interrogation Session: 20150304185808
Implantable Pulse Generator Model: 2110
Implantable Pulse Generator Serial Number: 7317017
Lead Channel Impedance Value: 390 Ohm
Lead Channel Impedance Value: 700 Ohm
Lead Channel Pacing Threshold Amplitude: 0.625 V
Lead Channel Pacing Threshold Amplitude: 0.75 V
Lead Channel Pacing Threshold Pulse Width: 0.5 ms
Lead Channel Pacing Threshold Pulse Width: 0.5 ms
Lead Channel Sensing Intrinsic Amplitude: 0.8 mV
Lead Channel Sensing Intrinsic Amplitude: 12 mV
Lead Channel Setting Pacing Amplitude: 1 V
Lead Channel Setting Pacing Amplitude: 1.625
Lead Channel Setting Pacing Pulse Width: 0.5 ms
Lead Channel Setting Sensing Sensitivity: 2 mV

## 2013-04-07 ENCOUNTER — Other Ambulatory Visit: Payer: Self-pay | Admitting: Geriatric Medicine

## 2013-04-07 DIAGNOSIS — R51 Headache: Principal | ICD-10-CM

## 2013-04-07 DIAGNOSIS — R519 Headache, unspecified: Secondary | ICD-10-CM

## 2013-04-08 ENCOUNTER — Ambulatory Visit
Admission: RE | Admit: 2013-04-08 | Discharge: 2013-04-08 | Disposition: A | Discharge status: Home / Self Care | Payer: Medicare Other | Source: Ambulatory Visit | Attending: Geriatric Medicine | Admitting: Geriatric Medicine

## 2013-04-08 DIAGNOSIS — R519 Headache, unspecified: Secondary | ICD-10-CM

## 2013-04-08 DIAGNOSIS — G43909 Migraine, unspecified, not intractable, without status migrainosus: Secondary | ICD-10-CM | POA: Diagnosis not present

## 2013-04-08 DIAGNOSIS — R51 Headache: Principal | ICD-10-CM

## 2013-04-14 DIAGNOSIS — H353290 Exudative age-related macular degeneration, unspecified eye, stage unspecified: Secondary | ICD-10-CM | POA: Diagnosis not present

## 2013-05-18 ENCOUNTER — Encounter: Payer: Self-pay | Admitting: *Deleted

## 2013-05-26 DIAGNOSIS — H353290 Exudative age-related macular degeneration, unspecified eye, stage unspecified: Secondary | ICD-10-CM | POA: Diagnosis not present

## 2013-05-26 DIAGNOSIS — G43909 Migraine, unspecified, not intractable, without status migrainosus: Secondary | ICD-10-CM | POA: Diagnosis not present

## 2013-06-09 DIAGNOSIS — H353190 Nonexudative age-related macular degeneration, unspecified eye, stage unspecified: Secondary | ICD-10-CM | POA: Diagnosis not present

## 2013-06-09 DIAGNOSIS — H353290 Exudative age-related macular degeneration, unspecified eye, stage unspecified: Secondary | ICD-10-CM | POA: Diagnosis not present

## 2013-06-09 DIAGNOSIS — H43819 Vitreous degeneration, unspecified eye: Secondary | ICD-10-CM | POA: Diagnosis not present

## 2013-06-20 DIAGNOSIS — K219 Gastro-esophageal reflux disease without esophagitis: Secondary | ICD-10-CM | POA: Diagnosis not present

## 2013-06-20 DIAGNOSIS — I495 Sick sinus syndrome: Secondary | ICD-10-CM | POA: Diagnosis not present

## 2013-06-20 DIAGNOSIS — R55 Syncope and collapse: Secondary | ICD-10-CM | POA: Diagnosis not present

## 2013-06-20 DIAGNOSIS — I10 Essential (primary) hypertension: Secondary | ICD-10-CM | POA: Diagnosis not present

## 2013-06-20 DIAGNOSIS — I4891 Unspecified atrial fibrillation: Secondary | ICD-10-CM | POA: Diagnosis not present

## 2013-06-20 DIAGNOSIS — E785 Hyperlipidemia, unspecified: Secondary | ICD-10-CM | POA: Diagnosis not present

## 2013-06-21 DIAGNOSIS — Z85828 Personal history of other malignant neoplasm of skin: Secondary | ICD-10-CM | POA: Diagnosis not present

## 2013-06-21 DIAGNOSIS — L259 Unspecified contact dermatitis, unspecified cause: Secondary | ICD-10-CM | POA: Diagnosis not present

## 2013-06-21 DIAGNOSIS — L820 Inflamed seborrheic keratosis: Secondary | ICD-10-CM | POA: Diagnosis not present

## 2013-06-28 ENCOUNTER — Ambulatory Visit (INDEPENDENT_AMBULATORY_CARE_PROVIDER_SITE_OTHER): Payer: Medicare Other | Admitting: Internal Medicine

## 2013-06-28 ENCOUNTER — Encounter: Payer: Self-pay | Admitting: Internal Medicine

## 2013-06-28 VITALS — BP 188/75 | HR 64 | Ht 62.0 in | Wt 155.0 lb

## 2013-06-28 DIAGNOSIS — I495 Sick sinus syndrome: Secondary | ICD-10-CM

## 2013-06-28 DIAGNOSIS — Z950 Presence of cardiac pacemaker: Secondary | ICD-10-CM

## 2013-06-28 DIAGNOSIS — I4891 Unspecified atrial fibrillation: Secondary | ICD-10-CM | POA: Diagnosis not present

## 2013-06-28 DIAGNOSIS — I10 Essential (primary) hypertension: Secondary | ICD-10-CM | POA: Insufficient documentation

## 2013-06-28 LAB — MDC_IDC_ENUM_SESS_TYPE_INCLINIC
Battery Remaining Longevity: 124.8 mo
Battery Voltage: 2.95 V
Brady Statistic RA Percent Paced: 75 %
Brady Statistic RV Percent Paced: 0.68 %
Date Time Interrogation Session: 20150526141143
Implantable Pulse Generator Model: 2110
Implantable Pulse Generator Serial Number: 7317017
Lead Channel Impedance Value: 375 Ohm
Lead Channel Impedance Value: 675 Ohm
Lead Channel Pacing Threshold Amplitude: 0.625 V
Lead Channel Pacing Threshold Amplitude: 0.75 V
Lead Channel Pacing Threshold Pulse Width: 0.5 ms
Lead Channel Pacing Threshold Pulse Width: 0.5 ms
Lead Channel Sensing Intrinsic Amplitude: 0.6 mV
Lead Channel Sensing Intrinsic Amplitude: 12 mV
Lead Channel Setting Pacing Amplitude: 1 V
Lead Channel Setting Pacing Amplitude: 1.625
Lead Channel Setting Pacing Pulse Width: 0.5 ms
Lead Channel Setting Sensing Sensitivity: 2 mV

## 2013-06-28 NOTE — Patient Instructions (Signed)
Remote monitoring is used to monitor your pacemaker from home. This monitoring reduces the number of office visits required to check your device to one time per year. It allows [place] to keep an eye on the functioning of your device to ensure it is working properly. You are scheduled for a device check from home on 09-29-2013. You may send your transmission at any time that day. If you have a wireless device, the transmission will be sent automatically. After your physician reviews your transmission, you will receive a postcard with your next transmission date.  Your physician recommends that you schedule a follow-up appointment in: 12 months with Dr.Klein

## 2013-06-28 NOTE — Progress Notes (Signed)
Patient Care Team: Lajean Manes, MD as PCP - General (Internal Medicine)   HPI  Julie Mcpherson is a 80 y.o. female Seen following pacemaker implantation April 2013 for atrial fibrillation with post termination pauses.  She is doing much better without chest pain or shortness of breath or lightheadedness     Past Medical History  Diagnosis Date  . Hypertension   . GERD (gastroesophageal reflux disease)   . Arthritis   . Urinary frequency   . Diverticular disease   . Chronic kidney disease     stage 3 renal disease  . High cholesterol   . DVT (deep venous thrombosis) early 1980's    left medial upper thigh  . Blood transfusion   . Skin cancer of face   . DJD (degenerative joint disease)   . Overactive bladder   . Syncope secondary to post termination pauses 05/03/11    "passed out; first time ever"  . Atrial fibrillation   . Pacemaker   . Fracture, shoulder ~ 2003; 1997    left; right    Past Surgical History  Procedure Laterality Date  . Bladder suspension  1985  . Carpal tunnel release  04/18/2011    right; Procedure: CARPAL TUNNEL RELEASE;  Surgeon: Cammie Sickle., MD;  Location: [place];  Service: Orthopedics;  Laterality: Right;  . Appendectomy  ~ 1938  . Hernia repair  1986    "stomach"  . Total hip arthroplasty      "2 on right; 1 on left"  . Joint replacement      hips  . Cataract extraction w/ intraocular lens  implant, bilateral    . Skin cancer excision  2012    "twice"  . Carpal tunnel release  12/05/2011    Procedure: CARPAL TUNNEL RELEASE;  Surgeon: Cammie Sickle., MD;  Location: [place];  Service: Orthopedics;  Laterality: Left;    Current Outpatient Prescriptions  Medication Sig Dispense Refill  . acetaminophen (TYLENOL) 500 MG tablet Take 500 mg by mouth 2 (two) times daily as needed. For pain      . calcium carbonate (OS-CAL) 600 MG TABS Take 600 mg by mouth 2 (two) times daily with a  meal.      . cholecalciferol (VITAMIN D) 1000 UNITS tablet Take 1,000 Units by mouth daily.      . fish oil-omega-3 fatty acids 1000 MG capsule Take 1 g by mouth daily.      [name] losartan (COZAAR) 50 MG tablet daily.      . metoprolol succinate (TOPROL-XL) 25 MG 24 hr tablet Take 25 mg by mouth daily.      . Multiple Vitamin (MULTIVITAMIN WITH MINERALS) TABS Take 1 tablet by mouth daily.      [name] omeprazole (PRILOSEC) 40 MG capsule Take 40 mg by mouth daily.      [name] oxybutynin (DITROPAN XL) 15 MG 24 hr tablet Take 15 mg by mouth daily.       No current facility-administered medications for this visit.    Allergies  Allergen Reactions  . Codeine Other (See Comments)    "can't sleep; I climb the walls"  . Statins Other (See Comments)    "ocular migraine headaches"  . Sulfa Antibiotics Anaphylaxis, Itching and Rash  . Tetanus Toxoids Other (See Comments)    "think it started turning red around where I got shot; they gave me small doses and I did fine"  Review of Systems negative except from HPI and PMH  Physical Exam BP 188/75  Pulse 64  Ht 5\' 2"  (1.575 m)  Wt 155 lb (70.308 kg)  BMI 28.34 kg/m2 Well developed and nourished in no acute distress HENT normal Neck supple with JVP-flat Clear Regular rate and rhythm, no murmurs or gallops Abd-soft with active BS No Clubbing cyanosis edema Skin-warm and dry A & Oriented  Grossly normal sensory and motor function  ECG demonstrates sinus rhythm at 70 Intervals 18/10/39 Axis is left at -45   Assessment and  Plan  Atrial fibrillation with posttermination pauses  Pacemaker-St. Jude  Hypertension  Device function is normal. Her relatively low amplitude P waves. This was reprogrammed. She has infrequent episodes of mode switch; she has episodes that last more than a day. She is not on anticoagulation. This issue was deferred to her primary cardiologist. Blood pressures at home are in the 140-150 range. We will leave her on her  current medications

## 2013-07-07 DIAGNOSIS — H353190 Nonexudative age-related macular degeneration, unspecified eye, stage unspecified: Secondary | ICD-10-CM | POA: Diagnosis not present

## 2013-07-07 DIAGNOSIS — H353290 Exudative age-related macular degeneration, unspecified eye, stage unspecified: Secondary | ICD-10-CM | POA: Diagnosis not present

## 2013-07-15 DIAGNOSIS — M76899 Other specified enthesopathies of unspecified lower limb, excluding foot: Secondary | ICD-10-CM | POA: Diagnosis not present

## 2013-07-15 DIAGNOSIS — M25559 Pain in unspecified hip: Secondary | ICD-10-CM | POA: Diagnosis not present

## 2013-07-15 DIAGNOSIS — M1710 Unilateral primary osteoarthritis, unspecified knee: Secondary | ICD-10-CM | POA: Diagnosis not present

## 2013-07-18 DIAGNOSIS — N3942 Incontinence without sensory awareness: Secondary | ICD-10-CM | POA: Diagnosis not present

## 2013-07-18 DIAGNOSIS — R350 Frequency of micturition: Secondary | ICD-10-CM | POA: Diagnosis not present

## 2013-08-15 DIAGNOSIS — H353290 Exudative age-related macular degeneration, unspecified eye, stage unspecified: Secondary | ICD-10-CM | POA: Diagnosis not present

## 2013-08-25 DIAGNOSIS — I129 Hypertensive chronic kidney disease with stage 1 through stage 4 chronic kidney disease, or unspecified chronic kidney disease: Secondary | ICD-10-CM | POA: Diagnosis not present

## 2013-08-25 DIAGNOSIS — I700 Atherosclerosis of aorta: Secondary | ICD-10-CM | POA: Diagnosis not present

## 2013-08-25 DIAGNOSIS — N3942 Incontinence without sensory awareness: Secondary | ICD-10-CM | POA: Diagnosis not present

## 2013-08-25 DIAGNOSIS — Z79899 Other long term (current) drug therapy: Secondary | ICD-10-CM | POA: Diagnosis not present

## 2013-08-25 DIAGNOSIS — N183 Chronic kidney disease, stage 3 unspecified: Secondary | ICD-10-CM | POA: Diagnosis not present

## 2013-08-25 DIAGNOSIS — J309 Allergic rhinitis, unspecified: Secondary | ICD-10-CM | POA: Diagnosis not present

## 2013-09-12 DIAGNOSIS — J3489 Other specified disorders of nose and nasal sinuses: Secondary | ICD-10-CM | POA: Diagnosis not present

## 2013-09-12 DIAGNOSIS — R059 Cough, unspecified: Secondary | ICD-10-CM | POA: Diagnosis not present

## 2013-09-12 DIAGNOSIS — R05 Cough: Secondary | ICD-10-CM | POA: Diagnosis not present

## 2013-09-19 DIAGNOSIS — I495 Sick sinus syndrome: Secondary | ICD-10-CM | POA: Diagnosis not present

## 2013-09-19 DIAGNOSIS — E785 Hyperlipidemia, unspecified: Secondary | ICD-10-CM | POA: Diagnosis not present

## 2013-09-19 DIAGNOSIS — N390 Urinary tract infection, site not specified: Secondary | ICD-10-CM | POA: Diagnosis not present

## 2013-09-19 DIAGNOSIS — I10 Essential (primary) hypertension: Secondary | ICD-10-CM | POA: Diagnosis not present

## 2013-09-27 DIAGNOSIS — H353190 Nonexudative age-related macular degeneration, unspecified eye, stage unspecified: Secondary | ICD-10-CM | POA: Diagnosis not present

## 2013-09-27 DIAGNOSIS — H353290 Exudative age-related macular degeneration, unspecified eye, stage unspecified: Secondary | ICD-10-CM | POA: Diagnosis not present

## 2013-09-29 ENCOUNTER — Telehealth: Payer: Self-pay | Admitting: Cardiology

## 2013-09-29 ENCOUNTER — Ambulatory Visit (INDEPENDENT_AMBULATORY_CARE_PROVIDER_SITE_OTHER): Payer: Medicare Other | Admitting: *Deleted

## 2013-09-29 DIAGNOSIS — M25519 Pain in unspecified shoulder: Secondary | ICD-10-CM | POA: Diagnosis not present

## 2013-09-29 DIAGNOSIS — M1710 Unilateral primary osteoarthritis, unspecified knee: Secondary | ICD-10-CM | POA: Diagnosis not present

## 2013-09-29 DIAGNOSIS — M67919 Unspecified disorder of synovium and tendon, unspecified shoulder: Secondary | ICD-10-CM | POA: Diagnosis not present

## 2013-09-29 DIAGNOSIS — M719 Bursopathy, unspecified: Secondary | ICD-10-CM | POA: Diagnosis not present

## 2013-09-29 NOTE — Telephone Encounter (Signed)
LMOVM reminding pt to send remote transmission.   

## 2013-09-30 ENCOUNTER — Encounter: Payer: Self-pay | Admitting: Cardiology

## 2013-09-30 DIAGNOSIS — I495 Sick sinus syndrome: Secondary | ICD-10-CM

## 2013-09-30 NOTE — Progress Notes (Signed)
Remote pacemaker transmission.   

## 2013-10-03 LAB — MDC_IDC_ENUM_SESS_TYPE_REMOTE
Battery Remaining Longevity: 100 mo
Battery Remaining Percentage: 75 %
Battery Voltage: 2.95 V
Brady Statistic AP VP Percent: 1 %
Brady Statistic AP VS Percent: 77 %
Brady Statistic AS VP Percent: 1 %
Brady Statistic AS VS Percent: 23 %
Brady Statistic RA Percent Paced: 70 %
Brady Statistic RV Percent Paced: 1.2 %
Date Time Interrogation Session: 20150828151336
Implantable Pulse Generator Model: 2110
Implantable Pulse Generator Serial Number: 7317017
Lead Channel Impedance Value: 410 Ohm
Lead Channel Impedance Value: 750 Ohm
Lead Channel Pacing Threshold Amplitude: 0.625 V
Lead Channel Pacing Threshold Amplitude: 0.75 V
Lead Channel Pacing Threshold Pulse Width: 0.5 ms
Lead Channel Pacing Threshold Pulse Width: 0.5 ms
Lead Channel Sensing Intrinsic Amplitude: 0.8 mV
Lead Channel Sensing Intrinsic Amplitude: 12 mV
Lead Channel Setting Pacing Amplitude: 1 V
Lead Channel Setting Pacing Amplitude: 1.625
Lead Channel Setting Pacing Pulse Width: 0.5 ms
Lead Channel Setting Sensing Sensitivity: 2 mV

## 2013-10-13 ENCOUNTER — Encounter: Payer: Self-pay | Admitting: Cardiology

## 2013-10-20 ENCOUNTER — Encounter: Payer: Self-pay | Admitting: Internal Medicine

## 2013-10-20 DIAGNOSIS — M1710 Unilateral primary osteoarthritis, unspecified knee: Secondary | ICD-10-CM | POA: Diagnosis not present

## 2013-10-20 DIAGNOSIS — M19019 Primary osteoarthritis, unspecified shoulder: Secondary | ICD-10-CM | POA: Diagnosis not present

## 2013-11-22 DIAGNOSIS — H43813 Vitreous degeneration, bilateral: Secondary | ICD-10-CM | POA: Diagnosis not present

## 2013-11-22 DIAGNOSIS — H3531 Nonexudative age-related macular degeneration: Secondary | ICD-10-CM | POA: Diagnosis not present

## 2013-11-22 DIAGNOSIS — H3532 Exudative age-related macular degeneration: Secondary | ICD-10-CM | POA: Diagnosis not present

## 2013-12-06 ENCOUNTER — Encounter: Payer: Self-pay | Admitting: *Deleted

## 2013-12-19 DIAGNOSIS — I10 Essential (primary) hypertension: Secondary | ICD-10-CM | POA: Diagnosis not present

## 2013-12-19 DIAGNOSIS — I495 Sick sinus syndrome: Secondary | ICD-10-CM | POA: Diagnosis not present

## 2013-12-19 DIAGNOSIS — E785 Hyperlipidemia, unspecified: Secondary | ICD-10-CM | POA: Diagnosis not present

## 2013-12-19 DIAGNOSIS — I480 Paroxysmal atrial fibrillation: Secondary | ICD-10-CM | POA: Diagnosis not present

## 2013-12-23 DIAGNOSIS — L570 Actinic keratosis: Secondary | ICD-10-CM | POA: Diagnosis not present

## 2013-12-23 DIAGNOSIS — L821 Other seborrheic keratosis: Secondary | ICD-10-CM | POA: Diagnosis not present

## 2013-12-23 DIAGNOSIS — Z85828 Personal history of other malignant neoplasm of skin: Secondary | ICD-10-CM | POA: Diagnosis not present

## 2014-01-02 ENCOUNTER — Telehealth: Payer: Self-pay | Admitting: Internal Medicine

## 2014-01-02 ENCOUNTER — Ambulatory Visit (INDEPENDENT_AMBULATORY_CARE_PROVIDER_SITE_OTHER): Payer: Medicare Other | Admitting: *Deleted

## 2014-01-02 DIAGNOSIS — I495 Sick sinus syndrome: Secondary | ICD-10-CM

## 2014-01-02 LAB — MDC_IDC_ENUM_SESS_TYPE_REMOTE
Battery Remaining Longevity: 97 mo
Battery Remaining Percentage: 73 %
Battery Voltage: 2.95 V
Brady Statistic AP VP Percent: 1 %
Brady Statistic AP VS Percent: 79 %
Brady Statistic AS VP Percent: 1 %
Brady Statistic AS VS Percent: 21 %
Brady Statistic RA Percent Paced: 72 %
Brady Statistic RV Percent Paced: 1.2 %
Date Time Interrogation Session: 20151130115842
Implantable Pulse Generator Model: 2110
Implantable Pulse Generator Serial Number: 7317017
Lead Channel Impedance Value: 390 Ohm
Lead Channel Impedance Value: 690 Ohm
Lead Channel Pacing Threshold Amplitude: 0.625 V
Lead Channel Pacing Threshold Amplitude: 0.625 V
Lead Channel Pacing Threshold Pulse Width: 0.5 ms
Lead Channel Pacing Threshold Pulse Width: 0.5 ms
Lead Channel Sensing Intrinsic Amplitude: 1.3 mV
Lead Channel Sensing Intrinsic Amplitude: 12 mV
Lead Channel Setting Pacing Amplitude: 0.875
Lead Channel Setting Pacing Amplitude: 1.625
Lead Channel Setting Pacing Pulse Width: 0.5 ms
Lead Channel Setting Sensing Sensitivity: 2 mV

## 2014-01-02 NOTE — Telephone Encounter (Signed)
Informed pt that transmission was received. She verbalized understanding.

## 2014-01-02 NOTE — Telephone Encounter (Signed)
New message      Did we get her remote check?

## 2014-01-04 ENCOUNTER — Encounter: Payer: Self-pay | Admitting: Cardiology

## 2014-01-04 NOTE — Progress Notes (Signed)
Remote pacemaker transmission.   

## 2014-01-09 ENCOUNTER — Encounter: Payer: Self-pay | Admitting: Internal Medicine

## 2014-01-12 ENCOUNTER — Encounter (HOSPITAL_COMMUNITY): Payer: Self-pay | Admitting: Cardiology

## 2014-01-12 DIAGNOSIS — M1711 Unilateral primary osteoarthritis, right knee: Secondary | ICD-10-CM | POA: Diagnosis not present

## 2014-01-12 DIAGNOSIS — M1712 Unilateral primary osteoarthritis, left knee: Secondary | ICD-10-CM | POA: Diagnosis not present

## 2014-01-12 DIAGNOSIS — M19022 Primary osteoarthritis, left elbow: Secondary | ICD-10-CM | POA: Diagnosis not present

## 2014-02-21 DIAGNOSIS — M25511 Pain in right shoulder: Secondary | ICD-10-CM | POA: Diagnosis not present

## 2014-02-21 DIAGNOSIS — M12811 Other specific arthropathies, not elsewhere classified, right shoulder: Secondary | ICD-10-CM | POA: Diagnosis not present

## 2014-02-28 DIAGNOSIS — H3531 Nonexudative age-related macular degeneration: Secondary | ICD-10-CM | POA: Diagnosis not present

## 2014-03-02 DIAGNOSIS — E871 Hypo-osmolality and hyponatremia: Secondary | ICD-10-CM | POA: Diagnosis not present

## 2014-03-02 DIAGNOSIS — Z1389 Encounter for screening for other disorder: Secondary | ICD-10-CM | POA: Diagnosis not present

## 2014-03-02 DIAGNOSIS — Z23 Encounter for immunization: Secondary | ICD-10-CM | POA: Diagnosis not present

## 2014-03-02 DIAGNOSIS — R911 Solitary pulmonary nodule: Secondary | ICD-10-CM | POA: Diagnosis not present

## 2014-03-02 DIAGNOSIS — I700 Atherosclerosis of aorta: Secondary | ICD-10-CM | POA: Diagnosis not present

## 2014-03-02 DIAGNOSIS — I129 Hypertensive chronic kidney disease with stage 1 through stage 4 chronic kidney disease, or unspecified chronic kidney disease: Secondary | ICD-10-CM | POA: Diagnosis not present

## 2014-03-02 DIAGNOSIS — Z0000 Encounter for general adult medical examination without abnormal findings: Secondary | ICD-10-CM | POA: Diagnosis not present

## 2014-03-02 DIAGNOSIS — D649 Anemia, unspecified: Secondary | ICD-10-CM | POA: Diagnosis not present

## 2014-03-02 DIAGNOSIS — E780 Pure hypercholesterolemia: Secondary | ICD-10-CM | POA: Diagnosis not present

## 2014-03-02 DIAGNOSIS — N183 Chronic kidney disease, stage 3 (moderate): Secondary | ICD-10-CM | POA: Diagnosis not present

## 2014-03-02 DIAGNOSIS — Z7189 Other specified counseling: Secondary | ICD-10-CM | POA: Diagnosis not present

## 2014-03-02 DIAGNOSIS — Z79899 Other long term (current) drug therapy: Secondary | ICD-10-CM | POA: Diagnosis not present

## 2014-03-21 DIAGNOSIS — D2312 Other benign neoplasm of skin of left eyelid, including canthus: Secondary | ICD-10-CM | POA: Diagnosis not present

## 2014-03-21 IMAGING — CR DG HIP COMPLETE 2+V*R*
2 series · 2 of 2 positions shown · non-contrast
Comparison: None.

CLINICAL DATA: Fall 2 years ago.  Right hip pain worse with
standing.

RIGHT HIP - COMPLETE 2+ VIEW

[AP (1 of 2)]
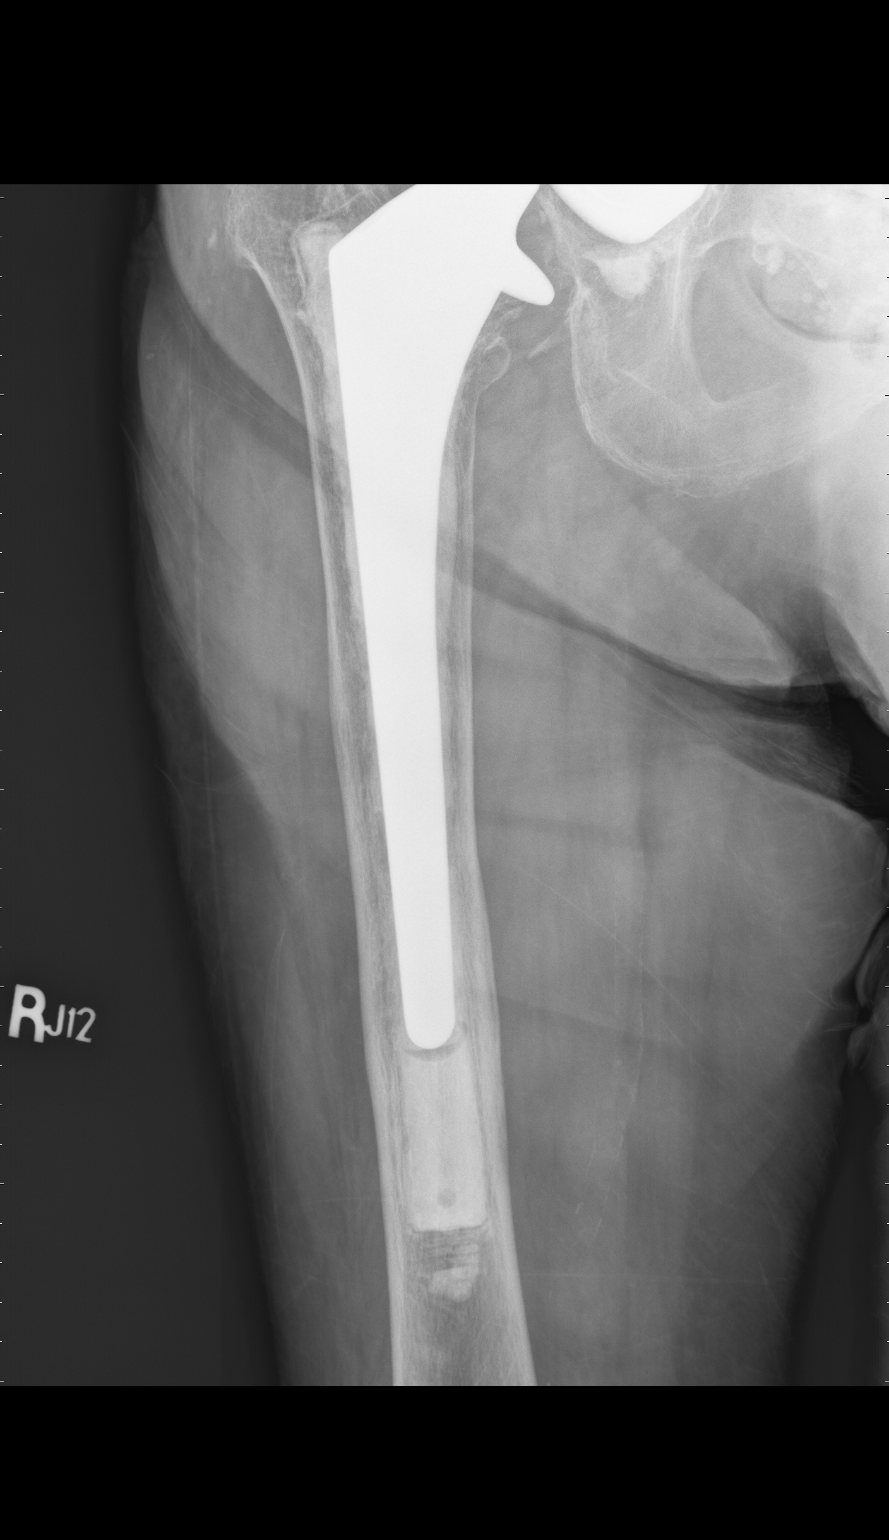

[AP (2 of 2)]
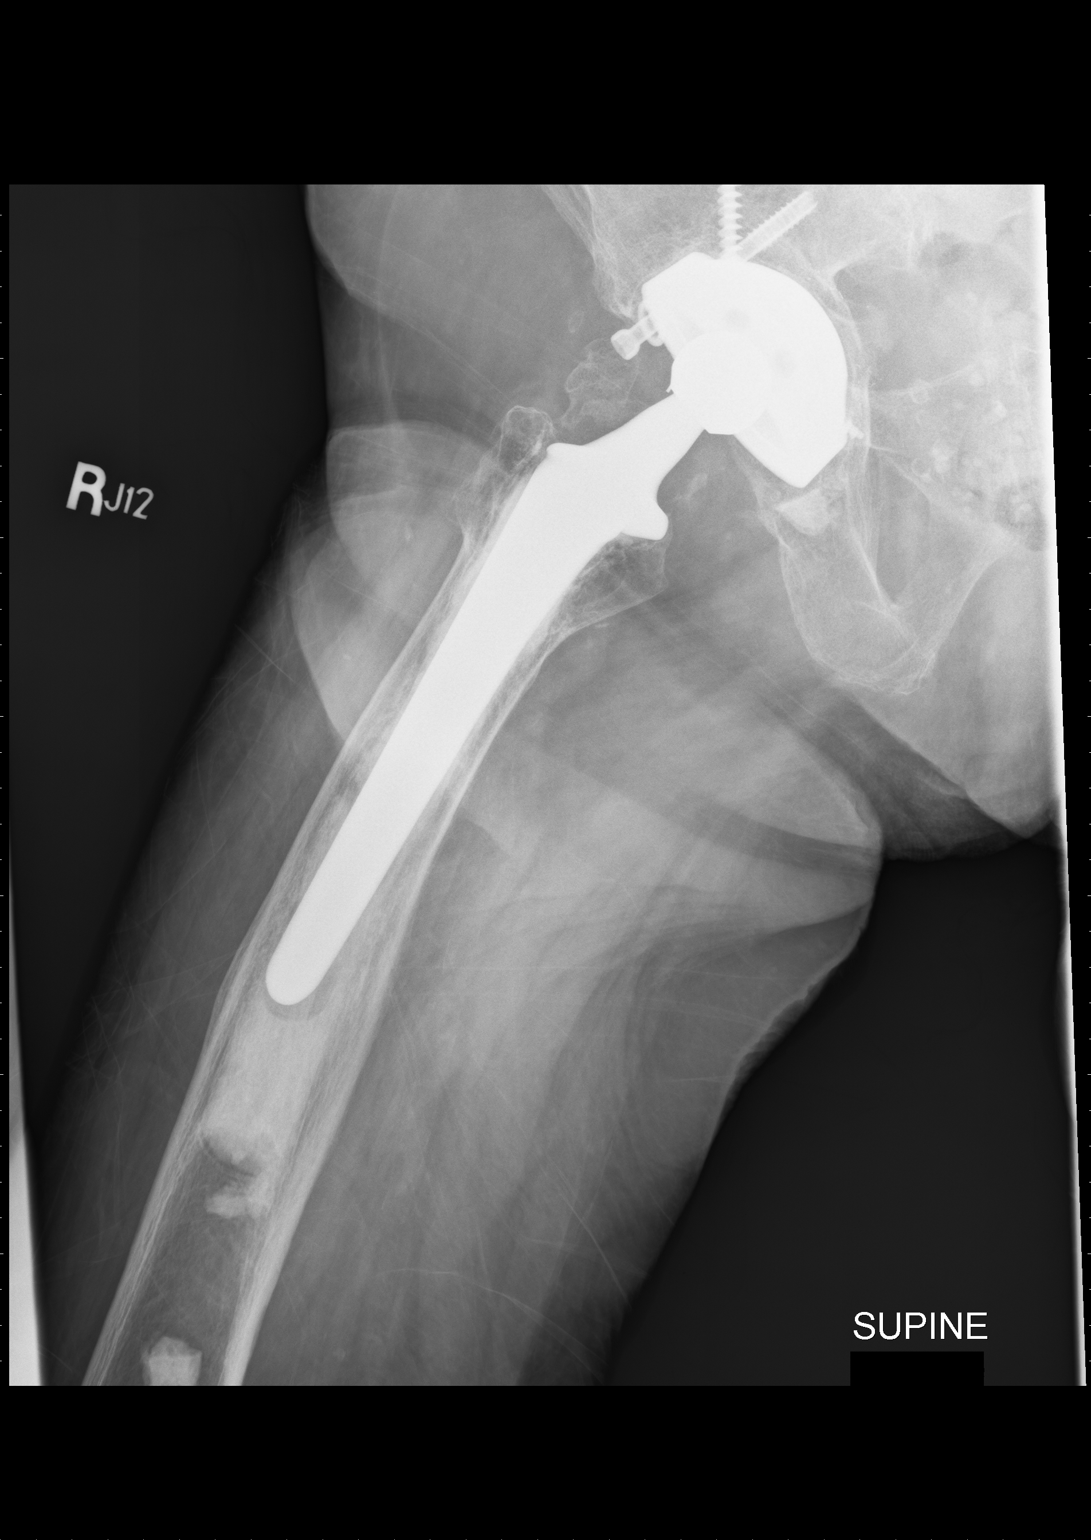

[2 of 2 positions shown; findings below may reference images not displayed]

FINDINGS: Prior right total hip replacement.  Asymmetric lucency at
the femoral tip component may represent loosening.

Limited evaluation of the pelvis. A P pelvis was not obtained (as
per order from the emergency room staff per radiology
technologist).
IMPRESSION: Prior right total hip replacement.  Asymmetric lucency at the
femoral tip component may represent loosening. No fracture
surrounding the prosthesis.

Limited evaluation of the pelvis. A P pelvis was not obtained (as
per order from the emergency room staff per radiology
technologist).  If there is concern for pelvic injury, then follow-
up imaging will be necessary.

## 2014-03-23 DIAGNOSIS — M12811 Other specific arthropathies, not elsewhere classified, right shoulder: Secondary | ICD-10-CM | POA: Diagnosis not present

## 2014-03-23 IMAGING — CR DG CHEST 1V PORT
1 series · 1 of 1 positions shown · non-contrast
Comparison: Chest x-ray 05/05/2011.

CLINICAL DATA: Status post pacemaker insertion.

PORTABLE CHEST - 1 VIEW

[AP]
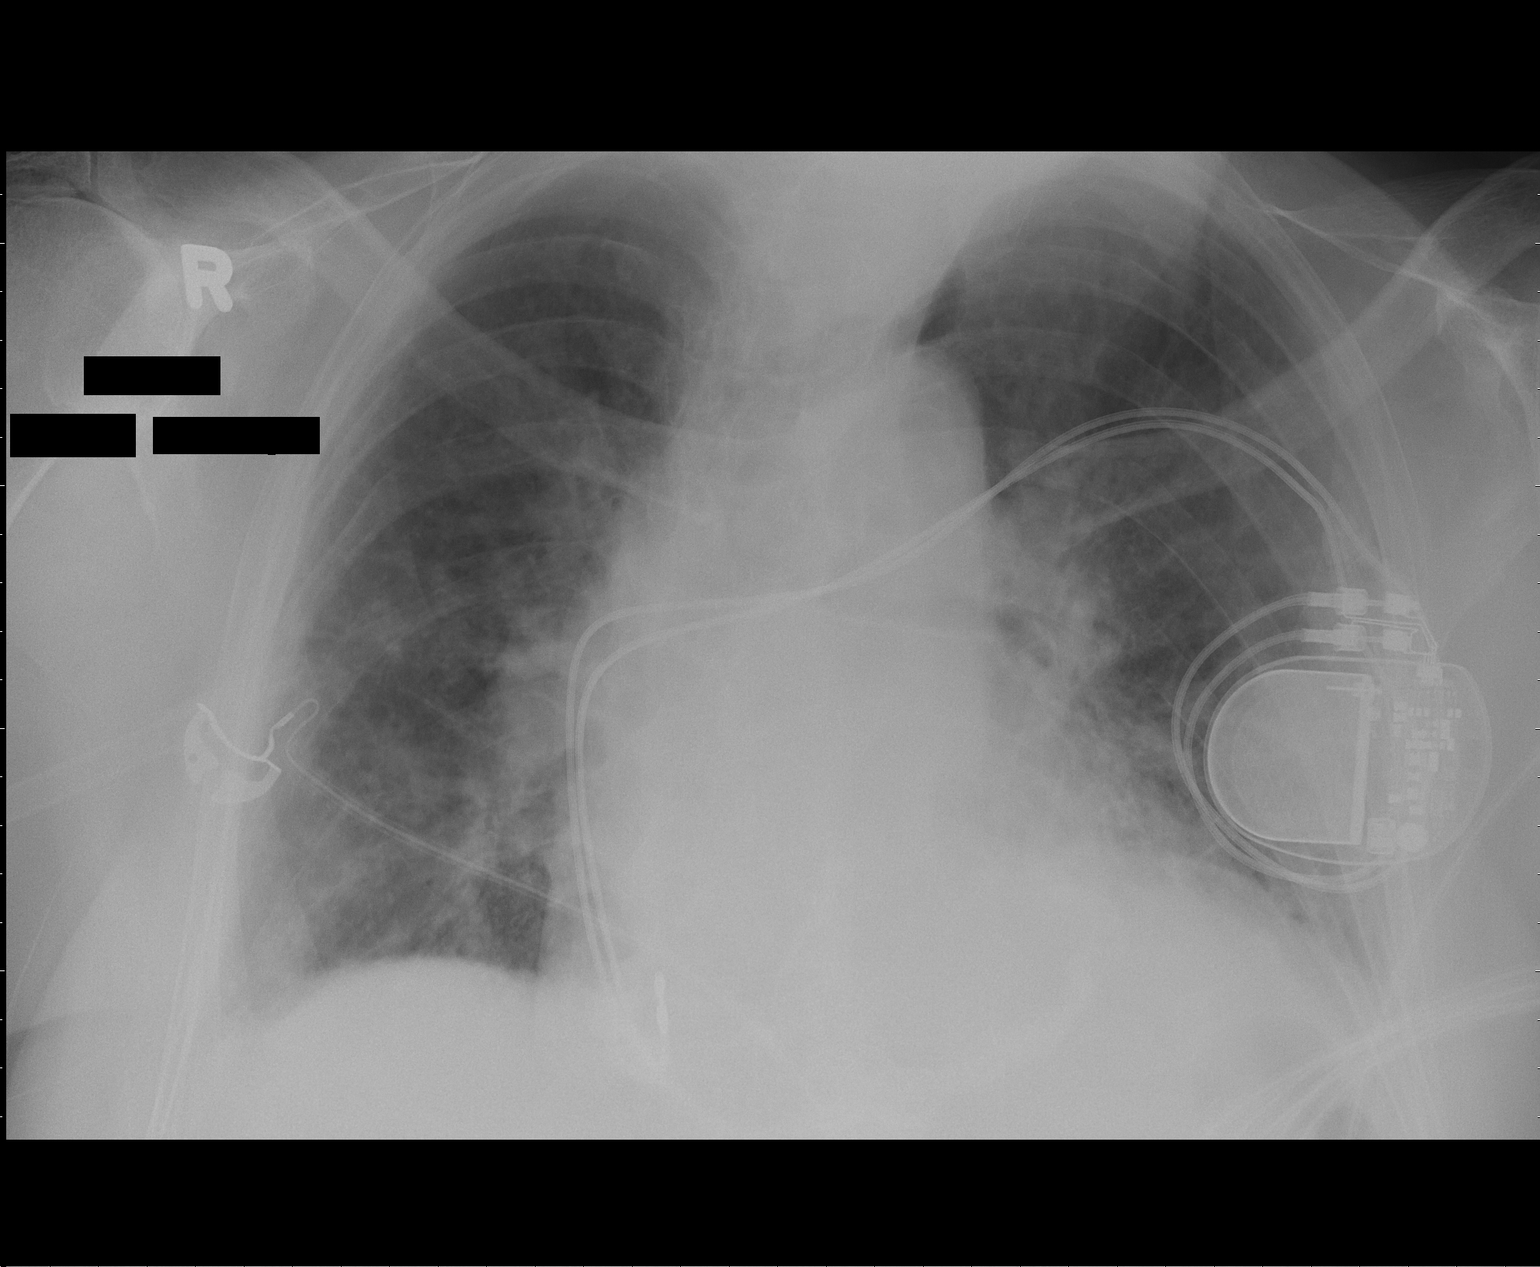

[1 of 1 positions shown; findings below may reference images not displayed]

FINDINGS: Compared to the prior examination, there is been interval
placement of a left sided pacemaker device, with one lead tip
projecting over the expected location of the right atrium, and the
other lead tip not well visualized (it appears to extend into the
right ventricle).  Lung volumes remain low.  There are bibasilar
opacities (left much greater than right), which are favored to
represent areas of atelectasis (underlying air space consolidation
is difficult to exclude).  Blunting of the costophrenic sulci
bilaterally suggest the presence of small bilateral pleural
effusions.  Mild cardiomegaly is again noted, likely accentuated by
patient rotation to the left which also distorts mediastinal
contours.  There is some cephalization of the pulmonary vasculature
and indistinctness of interstitial markings with diffuse
peribronchial cuffing, likely in part to reflect underlying
pulmonary edema (superimposed infection such as bronchitis is
difficult to exclude).  No pneumothorax.  Severe calcifications of
the mitral annulus.
IMPRESSION: 1.  Interval placement of pacemaker, as above, without pneumothorax
or other acute complicating features.
2.  Persistent low lung volumes with slight worsening bibasilar
opacities favored to reflect worsening bibasilar subsegmental
atelectasis.
3.  Mild - moderate pulmonary edema slightly increased.
4.  Given the extent of peribronchial cuffing, clinical correlation
for signs and symptoms of acute bronchitis is recommended.
5.  Small bilateral pleural effusions.

## 2014-03-29 DIAGNOSIS — I495 Sick sinus syndrome: Secondary | ICD-10-CM | POA: Diagnosis not present

## 2014-03-29 DIAGNOSIS — I10 Essential (primary) hypertension: Secondary | ICD-10-CM | POA: Diagnosis not present

## 2014-03-29 DIAGNOSIS — R55 Syncope and collapse: Secondary | ICD-10-CM | POA: Diagnosis not present

## 2014-03-29 DIAGNOSIS — E785 Hyperlipidemia, unspecified: Secondary | ICD-10-CM | POA: Diagnosis not present

## 2014-03-29 DIAGNOSIS — I480 Paroxysmal atrial fibrillation: Secondary | ICD-10-CM | POA: Diagnosis not present

## 2014-04-03 ENCOUNTER — Telehealth: Payer: Self-pay | Admitting: Cardiology

## 2014-04-03 ENCOUNTER — Ambulatory Visit (INDEPENDENT_AMBULATORY_CARE_PROVIDER_SITE_OTHER): Payer: Medicare Other | Admitting: *Deleted

## 2014-04-03 ENCOUNTER — Encounter: Payer: Medicare Other | Admitting: *Deleted

## 2014-04-03 DIAGNOSIS — I495 Sick sinus syndrome: Secondary | ICD-10-CM | POA: Diagnosis not present

## 2014-04-03 NOTE — Telephone Encounter (Signed)
Spoke with pt and reminded pt of remote transmission that is due today. Pt verbalized understanding.   

## 2014-04-04 ENCOUNTER — Encounter: Payer: Self-pay | Admitting: Cardiology

## 2014-04-05 NOTE — Progress Notes (Signed)
Remote pacemaker transmission.   

## 2014-04-10 LAB — MDC_IDC_ENUM_SESS_TYPE_REMOTE
Brady Statistic RA Percent Paced: 71 %
Brady Statistic RV Percent Paced: 1 %
Implantable Pulse Generator Model: 2110
Implantable Pulse Generator Serial Number: 7317017
Lead Channel Impedance Value: 390 Ohm
Lead Channel Impedance Value: 690 Ohm
Lead Channel Pacing Threshold Amplitude: 0.625 V
Lead Channel Pacing Threshold Amplitude: 0.625 V
Lead Channel Pacing Threshold Pulse Width: 0.5 ms
Lead Channel Pacing Threshold Pulse Width: 0.5 ms
Lead Channel Sensing Intrinsic Amplitude: 1.8 mV
Lead Channel Sensing Intrinsic Amplitude: 12 mV
Lead Channel Setting Pacing Amplitude: 0.875
Lead Channel Setting Pacing Amplitude: 1.625
Lead Channel Setting Pacing Pulse Width: 0.5 ms
Lead Channel Setting Sensing Sensitivity: 2 mV

## 2014-05-09 ENCOUNTER — Encounter: Payer: Self-pay | Admitting: Cardiology

## 2014-05-17 ENCOUNTER — Encounter: Payer: Self-pay | Admitting: Internal Medicine

## 2014-05-18 DIAGNOSIS — M1712 Unilateral primary osteoarthritis, left knee: Secondary | ICD-10-CM | POA: Diagnosis not present

## 2014-05-18 DIAGNOSIS — M19012 Primary osteoarthritis, left shoulder: Secondary | ICD-10-CM | POA: Diagnosis not present

## 2014-05-18 DIAGNOSIS — M71312 Other bursal cyst, left shoulder: Secondary | ICD-10-CM | POA: Diagnosis not present

## 2014-05-18 DIAGNOSIS — M1711 Unilateral primary osteoarthritis, right knee: Secondary | ICD-10-CM | POA: Diagnosis not present

## 2014-06-28 ENCOUNTER — Encounter: Payer: Medicare Other | Admitting: Internal Medicine

## 2014-06-28 DIAGNOSIS — I10 Essential (primary) hypertension: Secondary | ICD-10-CM | POA: Diagnosis not present

## 2014-06-28 DIAGNOSIS — R55 Syncope and collapse: Secondary | ICD-10-CM | POA: Diagnosis not present

## 2014-06-28 DIAGNOSIS — E785 Hyperlipidemia, unspecified: Secondary | ICD-10-CM | POA: Diagnosis not present

## 2014-06-28 DIAGNOSIS — I495 Sick sinus syndrome: Secondary | ICD-10-CM | POA: Diagnosis not present

## 2014-06-28 DIAGNOSIS — H3530 Unspecified macular degeneration: Secondary | ICD-10-CM | POA: Diagnosis not present

## 2014-06-28 DIAGNOSIS — I480 Paroxysmal atrial fibrillation: Secondary | ICD-10-CM | POA: Diagnosis not present

## 2014-06-29 ENCOUNTER — Encounter: Payer: Self-pay | Admitting: Internal Medicine

## 2014-06-29 ENCOUNTER — Ambulatory Visit (INDEPENDENT_AMBULATORY_CARE_PROVIDER_SITE_OTHER): Payer: Medicare Other | Admitting: Internal Medicine

## 2014-06-29 VITALS — BP 160/70 | HR 64 | Ht 64.0 in

## 2014-06-29 DIAGNOSIS — I4891 Unspecified atrial fibrillation: Secondary | ICD-10-CM | POA: Diagnosis not present

## 2014-06-29 DIAGNOSIS — I495 Sick sinus syndrome: Secondary | ICD-10-CM | POA: Diagnosis not present

## 2014-06-29 DIAGNOSIS — I480 Paroxysmal atrial fibrillation: Secondary | ICD-10-CM

## 2014-06-29 DIAGNOSIS — Z45018 Encounter for adjustment and management of other part of cardiac pacemaker: Secondary | ICD-10-CM | POA: Diagnosis not present

## 2014-06-29 NOTE — Progress Notes (Signed)
Patient Care Team: Lajean Manes, MD as PCP - General (Internal Medicine)   HPI  Julie Mcpherson is a 81 y.o. female Seen following pacemaker implantation April 2013 for atrial fibrillation with post termination pauses.  She is doing much better without chest pain or shortness of breath or lightheadedness   She tells me her son with melanoma is doing better    Past Medical History  Diagnosis Date  . Hypertension   . GERD (gastroesophageal reflux disease)   . Arthritis   . Urinary frequency   . Diverticular disease   . Chronic kidney disease     stage 3 renal disease  . High cholesterol   . DVT (deep venous thrombosis) early 1980's    left medial upper thigh  . Blood transfusion   . Skin cancer of face   . DJD (degenerative joint disease)   . Overactive bladder   . Syncope secondary to post termination pauses 05/03/11    "passed out; first time ever"  . Atrial fibrillation   . Pacemaker   . Fracture, shoulder ~ 2003; 1997    left; right    Past Surgical History  Procedure Laterality Date  . Bladder suspension  1985  . Carpal tunnel release  04/18/2011    right; Procedure: CARPAL TUNNEL RELEASE;  Surgeon: Cammie Sickle., MD;  Location: [place];  Service: Orthopedics;  Laterality: Right;  . Appendectomy  ~ 1938  . Hernia repair  1986    "stomach"  . Total hip arthroplasty      "2 on right; 1 on left"  . Joint replacement      hips  . Cataract extraction w/ intraocular lens  implant, bilateral    . Skin cancer excision  2012    "twice"  . Carpal tunnel release  12/05/2011    Procedure: CARPAL TUNNEL RELEASE;  Surgeon: Cammie Sickle., MD;  Location: [place];  Service: Orthopedics;  Laterality: Left;  . Temporary pacemaker insertion N/A 05/05/2011    Procedure: TEMPORARY PACEMAKER INSERTION;  Surgeon: Clent Demark, MD;  Location: [facility] CATH LAB;  Service: Cardiovascular;  Laterality: N/A;  . Permanent pacemaker  insertion N/A 05/06/2011    Procedure: PERMANENT PACEMAKER INSERTION;  Surgeon: Deboraha Sprang, MD;  Location: [facility] CATH [facility];  Service: Cardiovascular;  Laterality: N/A;    Current Outpatient Prescriptions  Medication Sig Dispense Refill  . amLODipine (NORVASC) 2.5 MG tablet Take 2.5 mg by mouth daily.    [name] aspirin 81 MG tablet Take 81 mg by mouth daily.    . calcium carbonate (OS-CAL) 600 MG TABS Take 600 mg by mouth 2 (two) times daily with a meal.    . cholecalciferol (VITAMIN D) 1000 UNITS tablet Take 1,000 Units by mouth daily.    . fish oil-omega-3 fatty acids 1000 MG capsule Take 1 g by mouth daily.    [name] losartan (COZAAR) 50 MG tablet Take 50 mg by mouth daily.     . metoprolol succinate (TOPROL-XL) 25 MG 24 hr tablet Take 25 mg by mouth daily.    . Multiple Vitamin (MULTIVITAMIN WITH MINERALS) TABS Take 1 tablet by mouth daily.    [name] omeprazole (PRILOSEC) 40 MG capsule Take 40 mg by mouth daily.    [name] oxybutynin (DITROPAN XL) 15 MG 24 hr tablet Take 15 mg by mouth daily.    [name] spironolactone (ALDACTONE) 25 MG tablet Take 12.5 mg by mouth daily.  No current facility-administered medications for this visit.    Allergies  Allergen Reactions  . Codeine Other (See Comments)    "can't sleep; I climb the walls"  . Statins Other (See Comments)    "ocular migraine headaches"  . Sulfa Antibiotics Anaphylaxis, Itching and Rash  . Tetanus Toxoids Other (See Comments)    "think it started turning red around where I got shot; they gave me small doses and I did fine"    Review of Systems negative except from HPI and PMH  Physical Exam BP 160/70 mmHg  Pulse 64  Ht 5\' 4"  (1.626 m)  Wt  Well developed and nourished in no acute distress HENT normal Neck supple with JVP-flat Clear Regular rate and rhythm, no murmurs or gallops Abd-soft with active BS No Clubbing cyanosis edema Skin-warm and dry A & Oriented  Grossly normal sensory and motor function  ECG demonstrates sinus rhythm at  70 Intervals 18/10/39 Axis is left at -45   Assessment and  Plan  Atrial fibrillation with posttermination pauses  Pacemaker-[employer]. Jude  Hypertension  Device function is normal. Her relatively low amplitude P waves. This was reprogrammed. She has infrequent episodes of mode switch; she has episodes that last more than a day. She is not on anticoagulation. This issue was deferred to her primary cardiologist. Blood pressures at home are in the 140-150 range. We will leave her on her current medications

## 2014-06-29 NOTE — Patient Instructions (Addendum)
Medication Instructions:  Your physician recommends that you continue on your current medications as directed. Please refer to the Current Medication list given to you today.  Labwork: None ordered  Testing/Procedures: None ordered  Follow-Up: Remote monitoring is used to monitor your pacemaker from home. This monitoring reduces the number of office visits required to check your device to one time per year. It allows [place] to keep an eye on the functioning of your device to ensure it is working properly. You are scheduled for a device check from home on 09/28/2014. You may send your transmission at any time that day. If you have a wireless device, the transmission will be sent automatically. After your physician reviews your transmission, you will receive a postcard with your next transmission date.  Your physician recommends that you schedule a follow-up appointment in: 12 months with Dr.Klein   Thank you for choosing [facility]!!

## 2014-06-30 LAB — CUP PACEART INCLINIC DEVICE CHECK
Battery Remaining Longevity: 111.6 mo
Battery Remaining Longevity: 111.6 mo
Battery Voltage: 2.95 V
Battery Voltage: 2.95 V
Brady Statistic RA Percent Paced: 72 %
Brady Statistic RA Percent Paced: 72 %
Brady Statistic RV Percent Paced: 0.93 %
Brady Statistic RV Percent Paced: 0.93 %
Date Time Interrogation Session: 20160526164851
Date Time Interrogation Session: 20160526164851
Lead Channel Impedance Value: 362.5 Ohm
Lead Channel Impedance Value: 362.5 Ohm
Lead Channel Impedance Value: 662.5 Ohm
Lead Channel Impedance Value: 662.5 Ohm
Lead Channel Pacing Threshold Amplitude: 0.625 V
Lead Channel Pacing Threshold Amplitude: 0.625 V
Lead Channel Pacing Threshold Amplitude: 0.75 V
Lead Channel Pacing Threshold Amplitude: 0.75 V
Lead Channel Pacing Threshold Amplitude: 0.75 V
Lead Channel Pacing Threshold Amplitude: 0.75 V
Lead Channel Pacing Threshold Pulse Width: 0.5 ms
Lead Channel Pacing Threshold Pulse Width: 0.5 ms
Lead Channel Pacing Threshold Pulse Width: 0.5 ms
Lead Channel Pacing Threshold Pulse Width: 0.5 ms
Lead Channel Pacing Threshold Pulse Width: 0.5 ms
Lead Channel Pacing Threshold Pulse Width: 0.5 ms
Lead Channel Sensing Intrinsic Amplitude: 0.8 mV
Lead Channel Sensing Intrinsic Amplitude: 0.8 mV
Lead Channel Sensing Intrinsic Amplitude: 12 mV
Lead Channel Sensing Intrinsic Amplitude: 12 mV
Lead Channel Setting Pacing Amplitude: 1.625
Lead Channel Setting Pacing Amplitude: 1.625
Lead Channel Setting Pacing Amplitude: 2.5 V
Lead Channel Setting Pacing Amplitude: 2.5 V
Lead Channel Setting Pacing Pulse Width: 0.5 ms
Lead Channel Setting Pacing Pulse Width: 0.5 ms
Lead Channel Setting Sensing Sensitivity: 2 mV
Lead Channel Setting Sensing Sensitivity: 2 mV
Pulse Gen Model: 2110
Pulse Gen Model: 2110
Pulse Gen Serial Number: 7317017
Pulse Gen Serial Number: 7317017

## 2014-08-29 DIAGNOSIS — H3532 Exudative age-related macular degeneration: Secondary | ICD-10-CM | POA: Diagnosis not present

## 2014-08-31 DIAGNOSIS — Z79899 Other long term (current) drug therapy: Secondary | ICD-10-CM | POA: Diagnosis not present

## 2014-08-31 DIAGNOSIS — I480 Paroxysmal atrial fibrillation: Secondary | ICD-10-CM | POA: Diagnosis not present

## 2014-08-31 DIAGNOSIS — D509 Iron deficiency anemia, unspecified: Secondary | ICD-10-CM | POA: Diagnosis not present

## 2014-08-31 DIAGNOSIS — I129 Hypertensive chronic kidney disease with stage 1 through stage 4 chronic kidney disease, or unspecified chronic kidney disease: Secondary | ICD-10-CM | POA: Diagnosis not present

## 2014-08-31 DIAGNOSIS — E222 Syndrome of inappropriate secretion of antidiuretic hormone: Secondary | ICD-10-CM | POA: Diagnosis not present

## 2014-08-31 DIAGNOSIS — I700 Atherosclerosis of aorta: Secondary | ICD-10-CM | POA: Diagnosis not present

## 2014-08-31 DIAGNOSIS — N183 Chronic kidney disease, stage 3 (moderate): Secondary | ICD-10-CM | POA: Diagnosis not present

## 2014-09-04 DIAGNOSIS — M1711 Unilateral primary osteoarthritis, right knee: Secondary | ICD-10-CM | POA: Diagnosis not present

## 2014-09-04 DIAGNOSIS — M1712 Unilateral primary osteoarthritis, left knee: Secondary | ICD-10-CM | POA: Diagnosis not present

## 2014-09-27 DIAGNOSIS — E785 Hyperlipidemia, unspecified: Secondary | ICD-10-CM | POA: Diagnosis not present

## 2014-09-27 DIAGNOSIS — I10 Essential (primary) hypertension: Secondary | ICD-10-CM | POA: Diagnosis not present

## 2014-09-27 DIAGNOSIS — I495 Sick sinus syndrome: Secondary | ICD-10-CM | POA: Diagnosis not present

## 2014-09-27 DIAGNOSIS — I480 Paroxysmal atrial fibrillation: Secondary | ICD-10-CM | POA: Diagnosis not present

## 2014-09-28 ENCOUNTER — Ambulatory Visit (INDEPENDENT_AMBULATORY_CARE_PROVIDER_SITE_OTHER): Payer: Medicare Other | Admitting: *Deleted

## 2014-09-28 ENCOUNTER — Telehealth: Payer: Self-pay | Admitting: Cardiology

## 2014-09-28 DIAGNOSIS — I129 Hypertensive chronic kidney disease with stage 1 through stage 4 chronic kidney disease, or unspecified chronic kidney disease: Secondary | ICD-10-CM | POA: Diagnosis not present

## 2014-09-28 DIAGNOSIS — Z950 Presence of cardiac pacemaker: Secondary | ICD-10-CM

## 2014-09-28 DIAGNOSIS — N183 Chronic kidney disease, stage 3 (moderate): Secondary | ICD-10-CM | POA: Diagnosis not present

## 2014-09-28 DIAGNOSIS — I495 Sick sinus syndrome: Secondary | ICD-10-CM | POA: Diagnosis not present

## 2014-09-28 DIAGNOSIS — R32 Unspecified urinary incontinence: Secondary | ICD-10-CM | POA: Diagnosis not present

## 2014-09-28 DIAGNOSIS — I4891 Unspecified atrial fibrillation: Secondary | ICD-10-CM

## 2014-09-28 NOTE — Telephone Encounter (Signed)
LMOVM reminding pt to send remote transmission.   

## 2014-09-29 ENCOUNTER — Telehealth: Payer: Self-pay | Admitting: Internal Medicine

## 2014-09-29 NOTE — Telephone Encounter (Signed)
LMOVM stating remote was received. Also stated if concern is discovered, we'll call her.

## 2014-09-29 NOTE — Telephone Encounter (Signed)
New message     Pt forgot to transmit yesterday----she will do it today

## 2014-10-02 NOTE — Progress Notes (Signed)
PPM remote received.

## 2014-10-03 ENCOUNTER — Encounter: Payer: Self-pay | Admitting: Cardiology

## 2014-10-05 LAB — CUP PACEART REMOTE DEVICE CHECK
Battery Remaining Longevity: 109 mo
Battery Remaining Percentage: 91 %
Battery Voltage: 2.95 V
Brady Statistic AP VP Percent: 1 %
Brady Statistic AP VS Percent: 77 %
Brady Statistic AS VP Percent: 1 %
Brady Statistic AS VS Percent: 23 %
Brady Statistic RA Percent Paced: 70 %
Brady Statistic RV Percent Paced: 1.4 %
Date Time Interrogation Session: 20160826144003
Lead Channel Impedance Value: 380 Ohm
Lead Channel Impedance Value: 690 Ohm
Lead Channel Pacing Threshold Amplitude: 0.625 V
Lead Channel Pacing Threshold Amplitude: 0.75 V
Lead Channel Pacing Threshold Pulse Width: 0.5 ms
Lead Channel Pacing Threshold Pulse Width: 0.5 ms
Lead Channel Sensing Intrinsic Amplitude: 1 mV
Lead Channel Sensing Intrinsic Amplitude: 12 mV
Lead Channel Setting Pacing Amplitude: 1.625
Lead Channel Setting Pacing Amplitude: 2.5 V
Lead Channel Setting Pacing Pulse Width: 0.5 ms
Lead Channel Setting Sensing Sensitivity: 2 mV
Pulse Gen Model: 2110
Pulse Gen Serial Number: 7317017

## 2014-10-10 ENCOUNTER — Telehealth: Payer: Self-pay | Admitting: Internal Medicine

## 2014-10-10 NOTE — Telephone Encounter (Signed)
Pt instructed to disregard letter- it was sent in error. Transmission was received 09-29-14.

## 2014-10-10 NOTE — Telephone Encounter (Signed)
New Message  4. Are you calling to see if we received your device transmission? Pt received a letter stating the transmission didn't go through. Requests a call back to discuss.

## 2014-10-13 ENCOUNTER — Encounter: Payer: Self-pay | Admitting: *Deleted

## 2014-10-25 ENCOUNTER — Encounter: Payer: Self-pay | Admitting: Internal Medicine

## 2014-10-30 DIAGNOSIS — Z961 Presence of intraocular lens: Secondary | ICD-10-CM | POA: Diagnosis not present

## 2014-10-30 DIAGNOSIS — H3532 Exudative age-related macular degeneration: Secondary | ICD-10-CM | POA: Diagnosis not present

## 2015-01-01 ENCOUNTER — Encounter: Payer: Medicare Other | Admitting: *Deleted

## 2015-01-01 ENCOUNTER — Telehealth: Payer: Self-pay | Admitting: Cardiology

## 2015-01-01 NOTE — Telephone Encounter (Signed)
LMOVM reminding pt to send remote transmission.   

## 2015-01-02 ENCOUNTER — Encounter: Payer: Self-pay | Admitting: Cardiology

## 2015-01-03 DIAGNOSIS — I495 Sick sinus syndrome: Secondary | ICD-10-CM | POA: Diagnosis not present

## 2015-01-03 DIAGNOSIS — I480 Paroxysmal atrial fibrillation: Secondary | ICD-10-CM | POA: Diagnosis not present

## 2015-01-03 DIAGNOSIS — E785 Hyperlipidemia, unspecified: Secondary | ICD-10-CM | POA: Diagnosis not present

## 2015-01-03 DIAGNOSIS — Z950 Presence of cardiac pacemaker: Secondary | ICD-10-CM | POA: Diagnosis not present

## 2015-01-03 DIAGNOSIS — I10 Essential (primary) hypertension: Secondary | ICD-10-CM | POA: Diagnosis not present

## 2015-01-04 ENCOUNTER — Ambulatory Visit (INDEPENDENT_AMBULATORY_CARE_PROVIDER_SITE_OTHER): Payer: Medicare Other | Admitting: *Deleted

## 2015-01-04 DIAGNOSIS — I495 Sick sinus syndrome: Secondary | ICD-10-CM | POA: Diagnosis not present

## 2015-01-05 NOTE — Progress Notes (Signed)
Remote pacemaker transmission.   

## 2015-01-17 LAB — CUP PACEART REMOTE DEVICE CHECK
Battery Remaining Longevity: 100 mo
Battery Remaining Percentage: 81 %
Battery Voltage: 2.93 V
Brady Statistic AP VP Percent: 1 %
Brady Statistic AP VS Percent: 75 %
Brady Statistic AS VP Percent: 1 %
Brady Statistic AS VS Percent: 24 %
Brady Statistic RA Percent Paced: 71 %
Brady Statistic RV Percent Paced: 1 %
Date Time Interrogation Session: 20161201134934
Implantable Lead Implant Date: 20130402
Implantable Lead Implant Date: 20130402
Implantable Lead Location: 753859
Implantable Lead Location: 753860
Implantable Lead Model: 1948
Lead Channel Impedance Value: 400 Ohm
Lead Channel Impedance Value: 700 Ohm
Lead Channel Pacing Threshold Amplitude: 0.625 V
Lead Channel Pacing Threshold Amplitude: 0.75 V
Lead Channel Pacing Threshold Pulse Width: 0.5 ms
Lead Channel Pacing Threshold Pulse Width: 0.5 ms
Lead Channel Sensing Intrinsic Amplitude: 1.2 mV
Lead Channel Sensing Intrinsic Amplitude: 12 mV
Lead Channel Setting Pacing Amplitude: 1.625
Lead Channel Setting Pacing Amplitude: 2.5 V
Lead Channel Setting Pacing Pulse Width: 0.5 ms
Lead Channel Setting Sensing Sensitivity: 2 mV
Pulse Gen Model: 2110
Pulse Gen Serial Number: 7317017

## 2015-01-19 ENCOUNTER — Encounter: Payer: Self-pay | Admitting: Cardiology

## 2015-02-15 DIAGNOSIS — M1711 Unilateral primary osteoarthritis, right knee: Secondary | ICD-10-CM | POA: Diagnosis not present

## 2015-02-15 DIAGNOSIS — M1712 Unilateral primary osteoarthritis, left knee: Secondary | ICD-10-CM | POA: Diagnosis not present

## 2015-02-27 DIAGNOSIS — H43813 Vitreous degeneration, bilateral: Secondary | ICD-10-CM | POA: Diagnosis not present

## 2015-02-27 DIAGNOSIS — H353212 Exudative age-related macular degeneration, right eye, with inactive choroidal neovascularization: Secondary | ICD-10-CM | POA: Diagnosis not present

## 2015-02-27 DIAGNOSIS — H353112 Nonexudative age-related macular degeneration, right eye, intermediate dry stage: Secondary | ICD-10-CM | POA: Diagnosis not present

## 2015-03-08 DIAGNOSIS — Z79899 Other long term (current) drug therapy: Secondary | ICD-10-CM | POA: Diagnosis not present

## 2015-03-08 DIAGNOSIS — Z1389 Encounter for screening for other disorder: Secondary | ICD-10-CM | POA: Diagnosis not present

## 2015-03-08 DIAGNOSIS — E222 Syndrome of inappropriate secretion of antidiuretic hormone: Secondary | ICD-10-CM | POA: Diagnosis not present

## 2015-03-08 DIAGNOSIS — R911 Solitary pulmonary nodule: Secondary | ICD-10-CM | POA: Diagnosis not present

## 2015-03-08 DIAGNOSIS — I129 Hypertensive chronic kidney disease with stage 1 through stage 4 chronic kidney disease, or unspecified chronic kidney disease: Secondary | ICD-10-CM | POA: Diagnosis not present

## 2015-03-08 DIAGNOSIS — I700 Atherosclerosis of aorta: Secondary | ICD-10-CM | POA: Diagnosis not present

## 2015-03-08 DIAGNOSIS — E7800 Pure hypercholesterolemia, unspecified: Secondary | ICD-10-CM | POA: Diagnosis not present

## 2015-03-08 DIAGNOSIS — Z0000 Encounter for general adult medical examination without abnormal findings: Secondary | ICD-10-CM | POA: Diagnosis not present

## 2015-04-03 DIAGNOSIS — M75122 Complete rotator cuff tear or rupture of left shoulder, not specified as traumatic: Secondary | ICD-10-CM | POA: Diagnosis not present

## 2015-04-03 DIAGNOSIS — M19211 Secondary osteoarthritis, right shoulder: Secondary | ICD-10-CM | POA: Diagnosis not present

## 2015-04-03 DIAGNOSIS — M19212 Secondary osteoarthritis, left shoulder: Secondary | ICD-10-CM | POA: Diagnosis not present

## 2015-04-03 DIAGNOSIS — M75121 Complete rotator cuff tear or rupture of right shoulder, not specified as traumatic: Secondary | ICD-10-CM | POA: Diagnosis not present

## 2015-04-05 ENCOUNTER — Telehealth: Payer: Self-pay | Admitting: Cardiology

## 2015-04-05 ENCOUNTER — Telehealth: Payer: Self-pay | Admitting: Internal Medicine

## 2015-04-05 ENCOUNTER — Ambulatory Visit (INDEPENDENT_AMBULATORY_CARE_PROVIDER_SITE_OTHER): Payer: Medicare Other | Admitting: *Deleted

## 2015-04-05 DIAGNOSIS — I495 Sick sinus syndrome: Secondary | ICD-10-CM

## 2015-04-05 NOTE — Telephone Encounter (Signed)
LMOVM reminding pt to send remote transmission.   

## 2015-04-05 NOTE — Telephone Encounter (Signed)
Trying to send a transmission amd it is not transmitting , please call   Thanks

## 2015-04-05 NOTE — Telephone Encounter (Signed)
Spoke w/ pt and instructed her how to send manual transmission.

## 2015-04-06 NOTE — Progress Notes (Signed)
Remote pacemaker transmission.   

## 2015-04-14 LAB — CUP PACEART REMOTE DEVICE CHECK
Battery Remaining Longevity: 98 mo
Battery Remaining Percentage: 81 %
Battery Voltage: 2.93 V
Brady Statistic AP VP Percent: 1 %
Brady Statistic AP VS Percent: 75 %
Brady Statistic AS VP Percent: 1 %
Brady Statistic AS VS Percent: 24 %
Brady Statistic RA Percent Paced: 69 %
Brady Statistic RV Percent Paced: 1.4 %
Date Time Interrogation Session: 20170302180519
Implantable Lead Implant Date: 20130402
Implantable Lead Implant Date: 20130402
Implantable Lead Location: 753859
Implantable Lead Location: 753860
Implantable Lead Model: 1948
Lead Channel Impedance Value: 380 Ohm
Lead Channel Impedance Value: 690 Ohm
Lead Channel Pacing Threshold Amplitude: 0.625 V
Lead Channel Pacing Threshold Amplitude: 0.75 V
Lead Channel Pacing Threshold Pulse Width: 0.5 ms
Lead Channel Pacing Threshold Pulse Width: 0.5 ms
Lead Channel Sensing Intrinsic Amplitude: 0.7 mV
Lead Channel Sensing Intrinsic Amplitude: 12 mV
Lead Channel Setting Pacing Amplitude: 1.625
Lead Channel Setting Pacing Amplitude: 2.5 V
Lead Channel Setting Pacing Pulse Width: 0.5 ms
Lead Channel Setting Sensing Sensitivity: 2 mV
Pulse Gen Model: 2110
Pulse Gen Serial Number: 7317017

## 2015-04-14 NOTE — Progress Notes (Signed)
Normal remote reviewed. Known AF - no OAC per SK at last office visit  Next follow-up 06/2015 in clinic

## 2015-04-18 ENCOUNTER — Encounter: Payer: Self-pay | Admitting: Cardiology

## 2015-05-14 DIAGNOSIS — I10 Essential (primary) hypertension: Secondary | ICD-10-CM | POA: Diagnosis not present

## 2015-05-14 DIAGNOSIS — E785 Hyperlipidemia, unspecified: Secondary | ICD-10-CM | POA: Diagnosis not present

## 2015-05-14 DIAGNOSIS — I480 Paroxysmal atrial fibrillation: Secondary | ICD-10-CM | POA: Diagnosis not present

## 2015-05-14 DIAGNOSIS — H3530 Unspecified macular degeneration: Secondary | ICD-10-CM | POA: Diagnosis not present

## 2015-05-14 DIAGNOSIS — I495 Sick sinus syndrome: Secondary | ICD-10-CM | POA: Diagnosis not present

## 2015-06-13 ENCOUNTER — Encounter: Payer: Self-pay | Admitting: Internal Medicine

## 2015-06-13 ENCOUNTER — Ambulatory Visit (INDEPENDENT_AMBULATORY_CARE_PROVIDER_SITE_OTHER): Payer: Medicare Other | Admitting: Internal Medicine

## 2015-06-13 VITALS — BP 150/64 | HR 67 | Ht 64.0 in | Wt 145.2 lb

## 2015-06-13 DIAGNOSIS — I495 Sick sinus syndrome: Secondary | ICD-10-CM | POA: Diagnosis not present

## 2015-06-13 DIAGNOSIS — I480 Paroxysmal atrial fibrillation: Secondary | ICD-10-CM

## 2015-06-13 NOTE — Patient Instructions (Addendum)
Medication Instructions: Your physician recommends that you continue on your current medications as directed. Please refer to the Current Medication list given to you today.   Labwork: none  Procedures/Testing: none  Follow-Up: - Remote monitoring is used to monitor your Pacemaker of ICD from home. This monitoring reduces the number of office visits required to check your device to one time per year. It allows [place] to keep an eye on the functioning of your device to ensure it is working properly. You are scheduled for a device check from home on 09/12/15. You may send your transmission at any time that day. If you have a wireless device, the transmission will be sent automatically. After your physician reviews your transmission, you will receive a postcard with your next transmission date.   - Your physician wants you to follow-up in: 12 months with Dr. Caryl Comes. You will receive a reminder letter in the mail two months in advance. If you don't receive a letter, please call our office to schedule the follow-up appointment.   Any Additional Special Instructions Will Be Listed Below (If Applicable).     If you need a refill on your cardiac medications before your next appointment, please call your pharmacy.

## 2015-06-13 NOTE — Progress Notes (Signed)
Patient Care Team: Lajean Manes, MD as PCP - General (Internal Medicine)   HPI  Julie Mcpherson is a 82 y.o. female Seen following pacemaker implantation April 2013 for atrial fibrillation with post termination pauses.  She is doing much better without chest pain or shortness of breath or lightheadedness   She is largely nonambulatory. She does get around on her scooter. 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  She tells me her son with melanoma is doing better; played golf  Past Medical History  Diagnosis Date  . Hypertension   . GERD (gastroesophageal reflux disease)   . Arthritis   . Urinary frequency   . Diverticular disease   . Chronic kidney disease     stage 3 renal disease  . High cholesterol   . DVT (deep venous thrombosis) ([place]) early 1980's    left medial upper thigh  . Blood transfusion   . Skin cancer of face   . DJD (degenerative joint disease)   . Overactive bladder   . Syncope secondary to post termination pauses 05/03/11    "passed out; first time ever"  . Atrial fibrillation ([place])   . Pacemaker   . Fracture, shoulder ~ 2003; 1997    left; right    Past Surgical History  Procedure Laterality Date  . Bladder suspension  1985  . Carpal tunnel release  04/18/2011    right; Procedure: CARPAL TUNNEL RELEASE;  Surgeon: Cammie Sickle., MD;  Location: [facility];  Service: Orthopedics;  Laterality: Right;  . Appendectomy  ~ 1938  . Hernia repair  1986    "stomach"  . Total hip arthroplasty      "2 on right; 1 on left"  . Joint replacement      hips  . Cataract extraction w/ intraocular lens  implant, bilateral    . Skin cancer excision  2012    "twice"  . Carpal tunnel release  12/05/2011    Procedure: CARPAL TUNNEL RELEASE;  Surgeon: Cammie Sickle., MD;  Location: [place];  Service: Orthopedics;  Laterality: Left;  . Temporary pacemaker insertion N/A 05/05/2011     Procedure: TEMPORARY PACEMAKER INSERTION;  Surgeon: Clent Demark, MD;  Location: [place] CATH LAB;  Service: Cardiovascular;  Laterality: N/A;  . Permanent pacemaker insertion N/A 05/06/2011    Procedure: PERMANENT PACEMAKER INSERTION;  Surgeon: Deboraha Sprang, MD;  Location: [facility] CATH LAB;  Service: Cardiovascular;  Laterality: N/A;    Current Outpatient Prescriptions  Medication Sig Dispense Refill  . amLODipine (NORVASC) 2.5 MG tablet Take 2.5 mg by mouth daily.    [name] aspirin 81 MG tablet Take 81 mg by mouth daily.    . calcium carbonate (OS-CAL) 600 MG TABS Take 600 mg by mouth 2 (two) times daily with a meal.    . cholecalciferol (VITAMIN D) 1000 UNITS tablet Take 1,000 Units by mouth daily.    . fish oil-omega-3 fatty acids 1000 MG capsule Take 1 g by mouth daily.    . metoprolol succinate (TOPROL-XL) 25 MG 24 hr tablet Take 25 mg by mouth daily.    . Multiple Vitamin (MULTIVITAMIN WITH MINERALS) TABS Take 1 tablet by mouth daily.    [name] MYRBETRIQ 50 MG TB24 tablet Take 1 tablet by mouth daily.    [name] omeprazole (PRILOSEC) 40 MG capsule Take 40 mg by mouth daily.    [name] oxybutynin (DITROPAN XL)  15 MG 24 hr tablet Take 15 mg by mouth daily.    [name] spironolactone (ALDACTONE) 25 MG tablet Take 12.5 mg by mouth daily.     No current facility-administered medications for this visit.    Allergies  Allergen Reactions  . Codeine Other (See Comments)    "can't sleep; I climb the walls"  . Statins Other (See Comments)    "ocular migraine headaches"  . Sulfa Antibiotics Anaphylaxis, Itching and Rash  . Tetanus Toxoids Other (See Comments)    "think it started turning red around where I got shot; they gave me small doses and I did fine"    Review of Systems negative except from HPI and PMH  Physical Exam BP 150/64 mmHg  Pulse 67  Ht 5\' 4"  (1.626 m)  Wt 145 lb 3.2 oz (65.862 kg)  BMI 24.91 kg/m2 Well developed and nourished in no acute distress HENT normal Neck supple with  JVP-flat Clear Regular rate and rhythm, no murmurs or gallops Abd-soft with active BS No Clubbing cyanosis Asymmetric right greater than left 1+ edema Skin-warm and dry A & Oriented  Grossly normal sensory and motor function  ECG demonstrates sinus rhythm at 67 Intervals 19/14/44 Right bundle branch block   Assessment and  Plan  Atrial fibrillation with posttermination pauses  Pacemaker-St. Jude  Hypertension  Right bundle branch block-new  She continues with intermittent episodes of atrial fibrillation. Issue of anticoagulation has been deferred to her primary cardiologist [facility] Blood pressure is reasonable. Device function is normal.

## 2015-06-19 LAB — CUP PACEART INCLINIC DEVICE CHECK
Battery Remaining Longevity: 102
Battery Voltage: 2.93 V
Brady Statistic RA Percent Paced: 67 %
Brady Statistic RV Percent Paced: 1.8 %
Date Time Interrogation Session: 20170510144240
Implantable Lead Implant Date: 20130402
Implantable Lead Implant Date: 20130402
Implantable Lead Location: 753859
Implantable Lead Location: 753860
Implantable Lead Model: 1948
Lead Channel Impedance Value: 400 Ohm
Lead Channel Impedance Value: 712.5 Ohm
Lead Channel Pacing Threshold Amplitude: 0.75 V
Lead Channel Pacing Threshold Amplitude: 0.75 V
Lead Channel Pacing Threshold Pulse Width: 0.5 ms
Lead Channel Pacing Threshold Pulse Width: 0.5 ms
Lead Channel Sensing Intrinsic Amplitude: 0.7 mV
Lead Channel Sensing Intrinsic Amplitude: 12 mV
Lead Channel Setting Pacing Amplitude: 1.75 V
Lead Channel Setting Pacing Amplitude: 2.5 V
Lead Channel Setting Pacing Pulse Width: 0.5 ms
Lead Channel Setting Sensing Sensitivity: 2 mV
Pulse Gen Model: 2110
Pulse Gen Serial Number: 7317017

## 2015-06-29 DIAGNOSIS — M25551 Pain in right hip: Secondary | ICD-10-CM | POA: Diagnosis not present

## 2015-06-29 DIAGNOSIS — M25561 Pain in right knee: Secondary | ICD-10-CM | POA: Diagnosis not present

## 2015-06-29 DIAGNOSIS — M67912 Unspecified disorder of synovium and tendon, left shoulder: Secondary | ICD-10-CM | POA: Diagnosis not present

## 2015-06-29 DIAGNOSIS — M67911 Unspecified disorder of synovium and tendon, right shoulder: Secondary | ICD-10-CM | POA: Diagnosis not present

## 2015-07-17 DIAGNOSIS — H353122 Nonexudative age-related macular degeneration, left eye, intermediate dry stage: Secondary | ICD-10-CM | POA: Diagnosis not present

## 2015-07-17 DIAGNOSIS — H353212 Exudative age-related macular degeneration, right eye, with inactive choroidal neovascularization: Secondary | ICD-10-CM | POA: Diagnosis not present

## 2015-08-17 DIAGNOSIS — H1032 Unspecified acute conjunctivitis, left eye: Secondary | ICD-10-CM | POA: Diagnosis not present

## 2015-09-12 ENCOUNTER — Telehealth: Payer: Self-pay | Admitting: Cardiology

## 2015-09-12 ENCOUNTER — Ambulatory Visit (INDEPENDENT_AMBULATORY_CARE_PROVIDER_SITE_OTHER): Payer: Medicare Other | Admitting: *Deleted

## 2015-09-12 DIAGNOSIS — I495 Sick sinus syndrome: Secondary | ICD-10-CM | POA: Diagnosis not present

## 2015-09-12 NOTE — Progress Notes (Signed)
Remote pacemaker transmission.   

## 2015-09-12 NOTE — Telephone Encounter (Signed)
Spoke with pt and reminded pt of remote transmission that is due today. Pt verbalized understanding.   

## 2015-09-13 ENCOUNTER — Encounter: Payer: Self-pay | Admitting: Cardiology

## 2015-09-13 DIAGNOSIS — I129 Hypertensive chronic kidney disease with stage 1 through stage 4 chronic kidney disease, or unspecified chronic kidney disease: Secondary | ICD-10-CM | POA: Diagnosis not present

## 2015-09-13 DIAGNOSIS — R911 Solitary pulmonary nodule: Secondary | ICD-10-CM | POA: Diagnosis not present

## 2015-09-13 DIAGNOSIS — Z79899 Other long term (current) drug therapy: Secondary | ICD-10-CM | POA: Diagnosis not present

## 2015-09-13 DIAGNOSIS — E222 Syndrome of inappropriate secretion of antidiuretic hormone: Secondary | ICD-10-CM | POA: Diagnosis not present

## 2015-09-13 DIAGNOSIS — N3941 Urge incontinence: Secondary | ICD-10-CM | POA: Diagnosis not present

## 2015-09-13 DIAGNOSIS — N183 Chronic kidney disease, stage 3 (moderate): Secondary | ICD-10-CM | POA: Diagnosis not present

## 2015-09-19 LAB — CUP PACEART REMOTE DEVICE CHECK
Battery Remaining Longevity: 101 mo
Battery Remaining Percentage: 81 %
Battery Voltage: 2.93 V
Brady Statistic AP VP Percent: 1 %
Brady Statistic AP VS Percent: 67 %
Brady Statistic AS VP Percent: 1 %
Brady Statistic AS VS Percent: 33 %
Brady Statistic RA Percent Paced: 58 %
Brady Statistic RV Percent Paced: 2.7 %
Date Time Interrogation Session: 20170809171817
Implantable Lead Implant Date: 20130402
Implantable Lead Implant Date: 20130402
Implantable Lead Location: 753859
Implantable Lead Location: 753860
Implantable Lead Model: 1948
Lead Channel Impedance Value: 390 Ohm
Lead Channel Impedance Value: 780 Ohm
Lead Channel Pacing Threshold Amplitude: 0.625 V
Lead Channel Pacing Threshold Amplitude: 0.75 V
Lead Channel Pacing Threshold Pulse Width: 0.5 ms
Lead Channel Pacing Threshold Pulse Width: 0.5 ms
Lead Channel Sensing Intrinsic Amplitude: 1.3 mV
Lead Channel Sensing Intrinsic Amplitude: 12 mV
Lead Channel Setting Pacing Amplitude: 1.625
Lead Channel Setting Pacing Amplitude: 2.5 V
Lead Channel Setting Pacing Pulse Width: 0.5 ms
Lead Channel Setting Sensing Sensitivity: 2 mV
Pulse Gen Model: 2110
Pulse Gen Serial Number: 7317017

## 2015-09-24 DIAGNOSIS — I480 Paroxysmal atrial fibrillation: Secondary | ICD-10-CM | POA: Diagnosis not present

## 2015-09-24 DIAGNOSIS — I495 Sick sinus syndrome: Secondary | ICD-10-CM | POA: Diagnosis not present

## 2015-09-24 DIAGNOSIS — N189 Chronic kidney disease, unspecified: Secondary | ICD-10-CM | POA: Diagnosis not present

## 2015-09-24 DIAGNOSIS — E785 Hyperlipidemia, unspecified: Secondary | ICD-10-CM | POA: Diagnosis not present

## 2015-09-24 DIAGNOSIS — I129 Hypertensive chronic kidney disease with stage 1 through stage 4 chronic kidney disease, or unspecified chronic kidney disease: Secondary | ICD-10-CM | POA: Diagnosis not present

## 2015-10-16 DIAGNOSIS — M7061 Trochanteric bursitis, right hip: Secondary | ICD-10-CM | POA: Diagnosis not present

## 2015-10-16 DIAGNOSIS — M1711 Unilateral primary osteoarthritis, right knee: Secondary | ICD-10-CM | POA: Diagnosis not present

## 2015-10-16 DIAGNOSIS — M1712 Unilateral primary osteoarthritis, left knee: Secondary | ICD-10-CM | POA: Diagnosis not present

## 2015-11-03 DIAGNOSIS — M5442 Lumbago with sciatica, left side: Secondary | ICD-10-CM | POA: Diagnosis not present

## 2015-11-03 DIAGNOSIS — M545 Low back pain: Secondary | ICD-10-CM | POA: Diagnosis not present

## 2015-11-09 ENCOUNTER — Emergency Department (HOSPITAL_COMMUNITY)
Admission: EM | Admit: 2015-11-09 | Discharge: 2015-11-09 | Disposition: A | Discharge status: Home / Self Care | Payer: Medicare Other | Attending: Emergency Medicine | Admitting: Emergency Medicine

## 2015-11-09 ENCOUNTER — Emergency Department (HOSPITAL_COMMUNITY): Payer: Medicare Other

## 2015-11-09 DIAGNOSIS — Z7982 Long term (current) use of aspirin: Secondary | ICD-10-CM | POA: Diagnosis not present

## 2015-11-09 DIAGNOSIS — M48061 Spinal stenosis, lumbar region without neurogenic claudication: Secondary | ICD-10-CM | POA: Diagnosis not present

## 2015-11-09 DIAGNOSIS — N183 Chronic kidney disease, stage 3 (moderate): Secondary | ICD-10-CM | POA: Diagnosis not present

## 2015-11-09 DIAGNOSIS — M25559 Pain in unspecified hip: Secondary | ICD-10-CM | POA: Diagnosis not present

## 2015-11-09 DIAGNOSIS — I129 Hypertensive chronic kidney disease with stage 1 through stage 4 chronic kidney disease, or unspecified chronic kidney disease: Secondary | ICD-10-CM | POA: Insufficient documentation

## 2015-11-09 DIAGNOSIS — M5431 Sciatica, right side: Secondary | ICD-10-CM | POA: Diagnosis not present

## 2015-11-09 DIAGNOSIS — M545 Low back pain: Secondary | ICD-10-CM | POA: Diagnosis present

## 2015-11-09 DIAGNOSIS — Z950 Presence of cardiac pacemaker: Secondary | ICD-10-CM | POA: Insufficient documentation

## 2015-11-09 DIAGNOSIS — Z79899 Other long term (current) drug therapy: Secondary | ICD-10-CM | POA: Insufficient documentation

## 2015-11-09 DIAGNOSIS — Z85828 Personal history of other malignant neoplasm of skin: Secondary | ICD-10-CM | POA: Diagnosis not present

## 2015-11-09 DIAGNOSIS — M549 Dorsalgia, unspecified: Secondary | ICD-10-CM | POA: Diagnosis not present

## 2015-11-09 DIAGNOSIS — M5442 Lumbago with sciatica, left side: Secondary | ICD-10-CM | POA: Insufficient documentation

## 2015-11-09 LAB — URINALYSIS, ROUTINE W REFLEX MICROSCOPIC
Bilirubin Urine: NEGATIVE
Glucose, UA: NEGATIVE mg/dL
Hgb urine dipstick: NEGATIVE
Ketones, ur: NEGATIVE mg/dL
Leukocytes, UA: NEGATIVE
Nitrite: NEGATIVE
Protein, ur: NEGATIVE mg/dL
Specific Gravity, Urine: 1.014 (ref 1.005–1.030)
pH: 6.5 (ref 5.0–8.0)

## 2015-11-09 LAB — COMPREHENSIVE METABOLIC PANEL
ALT: 20 U/L (ref 14–54)
AST: 26 U/L (ref 15–41)
Albumin: 3.7 g/dL (ref 3.5–5.0)
Alkaline Phosphatase: 215 U/L — ABNORMAL HIGH (ref 38–126)
Anion gap: 9 (ref 5–15)
BUN: 43 mg/dL — ABNORMAL HIGH (ref 6–20)
CO2: 24 mmol/L (ref 22–32)
Calcium: 9.2 mg/dL (ref 8.9–10.3)
Chloride: 95 mmol/L — ABNORMAL LOW (ref 101–111)
Creatinine, Ser: 1.06 mg/dL — ABNORMAL HIGH (ref 0.44–1.00)
GFR calc Af Amer: 49 mL/min — ABNORMAL LOW (ref >60)
GFR calc non Af Amer: 42 mL/min — ABNORMAL LOW (ref >60)
Glucose, Bld: 95 mg/dL (ref 65–99)
Potassium: 4.6 mmol/L (ref 3.5–5.1)
Sodium: 128 mmol/L — ABNORMAL LOW (ref 135–145)
Total Bilirubin: 1.1 mg/dL (ref 0.3–1.2)
Total Protein: 6.9 g/dL (ref 6.5–8.1)

## 2015-11-09 LAB — CBC WITH DIFFERENTIAL/PLATELET
Basophils Absolute: 0 10*3/uL (ref 0.0–0.1)
Basophils Relative: 0 %
Eosinophils Absolute: 0 10*3/uL (ref 0.0–0.7)
Eosinophils Relative: 0 %
HCT: 43 % (ref 36.0–46.0)
Hemoglobin: 14.9 g/dL (ref 12.0–15.0)
Lymphocytes Relative: 16 %
Lymphs Abs: 1.8 10*3/uL (ref 0.7–4.0)
MCH: 31.9 pg (ref 26.0–34.0)
MCHC: 34.7 g/dL (ref 30.0–36.0)
MCV: 92.1 fL (ref 78.0–100.0)
Monocytes Absolute: 1.5 10*3/uL — ABNORMAL HIGH (ref 0.1–1.0)
Monocytes Relative: 13 %
Neutro Abs: 7.9 10*3/uL — ABNORMAL HIGH (ref 1.7–7.7)
Neutrophils Relative %: 71 %
Platelets: 304 10*3/uL (ref 150–400)
RBC: 4.67 MIL/uL (ref 3.87–5.11)
RDW: 13.4 % (ref 11.5–15.5)
WBC: 11.2 10*3/uL — ABNORMAL HIGH (ref 4.0–10.5)

## 2015-11-09 MED ORDER — GABAPENTIN 100 MG PO CAPS: 100.0000 mg | ORAL_CAPSULE | Freq: Three times a day (TID) | ORAL | 0 refills | Status: DC

## 2015-11-09 MED ORDER — DEXAMETHASONE SODIUM PHOSPHATE 10 MG/ML IJ SOLN
4.0000 mg | Freq: Once | INTRAMUSCULAR | Status: AC
  Administered 2015-11-09: 4 mg via INTRAVENOUS
  Filled 2015-11-09: qty 1

## 2015-11-09 MED ORDER — MORPHINE SULFATE (PF) 4 MG/ML IV SOLN
4.0000 mg | INTRAVENOUS | Status: DC | PRN
  Administered 2015-11-09: 4 mg via INTRAVENOUS
  Filled 2015-11-09: qty 1

## 2015-11-09 MED ORDER — HYDROCODONE-ACETAMINOPHEN 5-325 MG PO TABS: 1.0000 | ORAL_TABLET | ORAL | 0 refills | Status: DC | PRN

## 2015-11-09 MED ORDER — ONDANSETRON HCL 4 MG/2ML IJ SOLN
4.0000 mg | Freq: Once | INTRAMUSCULAR | Status: AC
  Administered 2015-11-09: 4 mg via INTRAVENOUS
  Filled 2015-11-09: qty 2

## 2015-11-09 MED ORDER — METHYLPREDNISOLONE 4 MG PO TBPK: ORAL_TABLET | ORAL | 0 refills | Status: DC

## 2015-11-09 NOTE — ED Notes (Signed)
Patient resting comfortably. Son remains at bedside.

## 2015-11-09 NOTE — ED Triage Notes (Addendum)
Pt arrived via [facility] from [facility]. Per EMS pt c/o of "pinched nerve" x 1 week in left lower back causing pain in left hip and leg; denies injury/fall. Swelling noted to left knee and ankle. EMS vitals 140/90, p 90, RR 16, 95%.

## 2015-11-09 NOTE — Progress Notes (Signed)
Patient ID: Julie Mcpherson, female   DOB: Dec 11, 1913, 82 y.o.   MRN: [id number] I was asked to look at the films on this almost 82 year old with back and left leg pain that sounds radicular. She has constipation and some worsening incontinence. CT scan of the lumbar spine shows multilevel spondylosis and stenosis with severe stenosis at L3-4 and L4-5 with a grade 1 spondylolisthesis at L4-5. Given her advanced age she is not a candidate for surgical intervention but she would be a candidate for medical therapy, physical therapy, and potentially injection therapy in the form of nerve root blocks and/or epidural steroid injections.

## 2015-11-09 NOTE — ED Provider Notes (Signed)
[place] DEPT Provider Note   CSN: [id number] Arrival date & time: 11/09/15  0803     History   Chief Complaint Chief Complaint  Patient presents with  . Back Pain    HPI Julie Mcpherson is a 82 y.o. female. She presents via EMS from [facility] independent living. Transferred today because of pain in her back, and her left leg. She has had increasing pain in her left back and left lower leg for the last week. Seen by her orthopedist and had injections in her knee, and in her hip. She states there was an x-ray done and she was told she had a "pinched nerve". She has increasing pain in her leg. She has pain stenting to ambulate. States that pain today was "intolerable" that she had had a disimpaction done yesterday because she cannot have a bowel movement.  Says she always has some stress incontinence. However she is "wet myself" completely over the last 2 days. Has not been on pain medications at home. Just finished 5 days of steroids without relief.  History of symptomatic bradycardia. Has a pacemaker that is not MRI compatible  HPI  Past Medical History:  Diagnosis Date  . Arthritis   . Atrial fibrillation ([place])   . Blood transfusion   . Chronic kidney disease    stage 3 renal disease  . Diverticular disease   . DJD (degenerative joint disease)   . DVT (deep venous thrombosis) ([place]) early 1980's   left medial upper thigh  . Fracture, shoulder ~ 2003; 1997   left; right  . GERD (gastroesophageal reflux disease)   . High cholesterol   . Hypertension   . Overactive bladder   . Pacemaker   . Skin cancer of face   . Syncope secondary to post termination pauses 05/03/11   "passed out; first time ever"  . Urinary frequency     Patient Active Problem List   Diagnosis Date Noted  . Essential hypertension 06/28/2013  . Sinus node dysfunction ([place]) 08/22/2011  . Pacemaker-St.Jude 08/15/2011  . New onset atrial fibrillation ([place]) 05/05/2011    Past Surgical History:    Procedure Laterality Date  . APPENDECTOMY  ~ 1938  . BLADDER SUSPENSION  1985  . CARPAL TUNNEL RELEASE  04/18/2011   right; Procedure: CARPAL TUNNEL RELEASE;  Surgeon: Cammie Sickle., MD;  Location: [facility];  Service: Orthopedics;  Laterality: Right;  . CARPAL TUNNEL RELEASE  12/05/2011   Procedure: CARPAL TUNNEL RELEASE;  Surgeon: Cammie Sickle., MD;  Location: [facility];  Service: Orthopedics;  Laterality: Left;  . CATARACT EXTRACTION W/ INTRAOCULAR LENS  IMPLANT, BILATERAL    . HERNIA REPAIR  1986   "stomach"  . JOINT REPLACEMENT     hips  . PERMANENT PACEMAKER INSERTION N/A 05/06/2011   Procedure: PERMANENT PACEMAKER INSERTION;  Surgeon: Deboraha Sprang, MD;  Location: [facility] CATH LAB;  Service: Cardiovascular;  Laterality: N/A;  . SKIN CANCER EXCISION  2012   "twice"  . TEMPORARY PACEMAKER INSERTION N/A 05/05/2011   Procedure: TEMPORARY PACEMAKER INSERTION;  Surgeon: Clent Demark, MD;  Location: [facility] CATH LAB;  Service: Cardiovascular;  Laterality: N/A;  . TOTAL HIP ARTHROPLASTY     "2 on right; 1 on left"    OB History    No data available       Home Medications    Prior to Admission medications   Medication Sig Start Date End Date Taking? Authorizing Provider  acetaminophen (TYLENOL) 500 MG tablet Take 500 mg by mouth 2 (two) times daily.   Yes Historical Provider, MD  amLODipine (NORVASC) 5 MG tablet Take 5 mg by mouth daily.   Yes Historical Provider, MD  aspirin 81 MG tablet Take 81 mg by mouth daily.   Yes Historical Provider, MD  calcium carbonate (OS-CAL) 600 MG TABS Take 600 mg by mouth daily with breakfast.    Yes Historical Provider, MD  cholecalciferol (VITAMIN D) 1000 UNITS tablet Take 1,000 Units by mouth daily.   Yes Historical Provider, MD  FERROUS SULFATE PO Take 1 tablet by mouth 2 (two) times a week.   Yes Historical Provider, MD  fish oil-omega-3 fatty acids 1000 MG capsule Take 1 g by mouth daily.   Yes Historical  Provider, MD  metoprolol succinate (TOPROL-XL) 25 MG 24 hr tablet Take 25 mg by mouth daily.   Yes Historical Provider, MD  Multiple Vitamin (MULTIVITAMIN WITH MINERALS) TABS Take 1 tablet by mouth daily.   Yes Historical Provider, MD  MYRBETRIQ 50 MG TB24 tablet Take 1 tablet by mouth daily. 06/05/15  Yes Historical Provider, MD  omeprazole (PRILOSEC) 40 MG capsule Take 40 mg by mouth daily.   Yes Historical Provider, MD  spironolactone (ALDACTONE) 25 MG tablet Take 12.5 mg by mouth daily. 06/10/14  Yes Historical Provider, MD  gabapentin (NEURONTIN) 100 MG capsule Take 1 capsule (100 mg total) by mouth 3 (three) times daily. 11/09/15   Tanna Furry, MD  HYDROcodone-acetaminophen (NORCO/VICODIN) 5-325 MG tablet Take 1 tablet by mouth every 4 (four) hours as needed. 11/09/15   Tanna Furry, MD  methylPREDNISolone (MEDROL DOSEPAK) 4 MG TBPK tablet 6 po on day 1, decrease by 1 tab per day 11/09/15   Tanna Furry, MD    Family History Family History  Problem Relation Age of Onset  . Family history unknown: Yes    Social History Social History  Substance Use Topics  . Smoking status: Never Smoker  . Smokeless tobacco: Never Used  . Alcohol use Yes     Comment: 05/05/11 "thimble full maybe twice a year"     Allergies   Codeine; Statins; Sulfa antibiotics; and Tetanus toxoids   Review of Systems Review of Systems  Constitutional: Negative for appetite change, chills, diaphoresis, fatigue and fever.  HENT: Negative for mouth sores, sore throat and trouble swallowing.   Eyes: Negative for visual disturbance.  Respiratory: Negative for cough, chest tightness, shortness of breath and wheezing.   Cardiovascular: Negative for chest pain.  Gastrointestinal: Negative for abdominal distention, abdominal pain, diarrhea, nausea and vomiting.  Endocrine: Negative for polydipsia, polyphagia and polyuria.  Genitourinary: Negative for dysuria, frequency and hematuria.  Musculoskeletal: Positive for back pain,  gait problem and myalgias.  Skin: Negative for color change, pallor and rash.  Neurological: Negative for dizziness, syncope, light-headedness and headaches.  Hematological: Does not bruise/bleed easily.  Psychiatric/Behavioral: Negative for behavioral problems and confusion.     Physical Exam Updated Vital Signs BP 115/83 (BP Location: Left Arm)   Pulse 90   Temp 98.3 F (36.8 C) (Oral)   Resp 16   SpO2 96%   Physical Exam  Constitutional: She is oriented to person, place, and time. She appears well-developed and well-nourished. No distress.  Heart is hearing but awake and alert 82 year old female. Offers details of her own history. Oriented lucid.  HENT:  Head: Normocephalic.  Eyes: Conjunctivae are normal. Pupils are equal, round, and reactive to light. No scleral icterus.  Neck:  Normal range of motion. Neck supple. No thyromegaly present.  Cardiovascular: Normal rate and regular rhythm.  Exam reveals no gallop and no friction rub.   No murmur heard. Pulmonary/Chest: Effort normal and breath sounds normal. No respiratory distress. She has no wheezes. She has no rales.  Abdominal: Soft. Bowel sounds are normal. She exhibits no distension. There is no tenderness. There is no rebound.  Musculoskeletal: Normal range of motion.       Legs: Pain was almost any movement of the left lower extremity. She is able to lift the leg off the bed with flexing the hip. She will flex extend the knee and plantarflex dorsiflex the foot. Reflexes are preserved.  Neurological: She is alert and oriented to person, place, and time.  Skin: Skin is warm and dry. No rash noted.  Psychiatric: She has a normal mood and affect. Her behavior is normal.     ED Treatments / Results  Labs (all labs ordered are listed, but only abnormal results are displayed) Labs Reviewed  CBC WITH DIFFERENTIAL/PLATELET - Abnormal; Notable for the following:       Result Value   WBC 11.2 (*)    Neutro Abs 7.9 (*)     Monocytes Absolute 1.5 (*)    All other components within normal limits  COMPREHENSIVE METABOLIC PANEL - Abnormal; Notable for the following:    Sodium 128 (*)    Chloride 95 (*)    BUN 43 (*)    Creatinine, Ser 1.06 (*)    Alkaline Phosphatase 215 (*)    GFR calc non Af Amer 42 (*)    GFR calc Af Amer 49 (*)    All other components within normal limits  URINE CULTURE  URINALYSIS, ROUTINE W REFLEX MICROSCOPIC (NOT AT [facility])    EKG  EKG Interpretation None       Radiology Ct Lumbar Spine Wo Contrast  Result Date: 11/09/2015 CLINICAL DATA:  Back pain.  Left hip and leg pain for 1 week. EXAM: CT LUMBAR SPINE WITHOUT CONTRAST TECHNIQUE: Multidetector CT imaging of the lumbar spine was performed without intravenous contrast administration. Multiplanar CT image reconstructions were also generated. COMPARISON:  CT abdomen and pelvis 02/28/2013 FINDINGS: Segmentation: 5 lumbar type vertebrae. T12 has only a hypoplastic rib on the right. Alignment: Mild lumbar levoscoliosis. Trace retrolisthesis of T12 on L1 and L1 on L2, unchanged. 3 mm anterolisthesis of L3 on L4 and 8 mm anterolisthesis of L4 on L5, unchanged. Vertebrae: Preserved vertebral body heights without evidence of fracture. Large L4 vertebral body hemangioma. Paraspinal and other soft tissues: Right upper pole renal scarring. Abdominal aortic atherosclerosis without aneurysm. Disc levels: Advanced multilevel disc degeneration with severe disc space narrowing from T11-12 to L2-3 and moderate narrowing at L3-4 and L4-5. Vacuum disc phenomenon at most levels. T11-12: Endplate spurring, disc bulging, facet arthrosis, disc space height loss result in mild left neural foraminal stenosis without spinal stenosis. T12-L1: Disc bulging, endplate spurring, and facet arthrosis result in right greater than left lateral recess stenosis and mild right neural foraminal stenosis. L1-2: Circumferential disc osteophyte complex and facet and ligamentum flavum  hypertrophy result in moderate spinal stenosis and mild bilateral neural foraminal stenosis. L2-3: Circumferential disc osteophyte complex and facet and ligamentum flavum hypertrophy result in moderate spinal stenosis and mild bilateral neural foraminal stenosis. L3-4: Listhesis with bulging uncovered disc and advanced facet and ligamentum flavum hypertrophy result in severe spinal stenosis and severe right neural foraminal stenosis. L4-5: Listhesis with bulging uncovered disc, ligamentum  flavum thickening, and marked facet hypertrophy result in marked spinal stenosis and mild right and moderate to severe left neural foraminal stenosis. L5-S1: Advanced facet arthrosis, mild disc bulging, and endplate spurring result in mild bilateral neural foraminal narrowing and bilateral lateral recess stenosis. IMPRESSION: 1. Advanced diffuse lumbar disc and facet degeneration, worst at L4-5 where there is grade 1 anterolisthesis and marked spinal stenosis. 2. Severe spinal stenosis at L3-4 with moderate stenosis at L1-2 and L2-3. Electronically Signed   By: Logan Bores M.D.   On: 11/09/2015 11:14    Procedures Procedures (including critical care time)  Medications Ordered in ED Medications  morphine 4 MG/ML injection 4 mg (4 mg Intravenous Given 11/09/15 1019)  dexamethasone (DECADRON) injection 4 mg (4 mg Intravenous Given 11/09/15 1014)  ondansetron (ZOFRAN) injection 4 mg (4 mg Intravenous Given 11/09/15 1016)     Initial Impression / Assessment and Plan / ED Course  I have reviewed the triage vital signs and the nursing notes.  Pertinent labs & imaging results that were available during my care of the patient were reviewed by me and considered in my medical decision making (see chart for details).  Clinical Course    Differential diagnosis would include neurogenic claudication. Radiculopathy. CT scan was obtained and shows multifactorial multilevel spinal stenosis most prevalent at 47 with ligament  flavum hypertrophy, facet hypertrophy, and disc bulge all resulting in mild to severe stenosis.  Patient in some are not interested in operative interventions. I discussed the case with Dr. Ronnald Ramp of neurosurgery. He did not feel there is no operative choice for this patient with consideration of age and orbit it is. I did discuss the possibility of epidural steroid injection with neuroradiology felt that this would not offer relief as this is a structural problem. I discussed this at length with patient and her son. Arrangements were made to her facility for her to go from independent living to a care center which is skilled nursing. I spoke with Diane the coordinator at her facility. As well as Wynonia Sours the Catering manager. At the bed available and ready for her and she was transferred back to undergo symptomatic treatment.  Final Clinical Impressions(s) / ED Diagnoses   Final diagnoses:  Acute bilateral low back pain with left-sided sciatica    New Prescriptions Discharge Medication List as of 11/09/2015  1:32 PM    START taking these medications   Details  gabapentin (NEURONTIN) 100 MG capsule Take 1 capsule (100 mg total) by mouth 3 (three) times daily., Starting Fri 11/09/2015, Print    HYDROcodone-acetaminophen (NORCO/VICODIN) 5-325 MG tablet Take 1 tablet by mouth every 4 (four) hours as needed., Starting Fri 11/09/2015, Print    methylPREDNISolone (MEDROL DOSEPAK) 4 MG TBPK tablet 6 po on day 1, decrease by 1 tab per day, Print         Tanna Furry, MD 11/09/15 1541

## 2015-11-09 NOTE — ED Notes (Signed)
Patient has urinary incontinence and soiled the bed linen, her clothes and pads under her.  Patient's bed linen changed and brief placed on patient.  Gown changed.

## 2015-11-09 NOTE — ED Notes (Signed)
Patient resting comfortably with son at bedside.

## 2015-11-09 NOTE — ED Notes (Signed)
[facility] called for transport.  Patient is dry and ready to be admitted to [facility], room 308-B.  Patient's son took her cane, clothing and purse back to SNF.

## 2015-11-09 NOTE — ED Notes (Signed)
Bed: WA17 Expected date:  Expected time:  Means of arrival:  Comments: EMS-hip pain

## 2015-11-10 LAB — URINE CULTURE: Culture: NO GROWTH

## 2015-11-11 DIAGNOSIS — E222 Syndrome of inappropriate secretion of antidiuretic hormone: Secondary | ICD-10-CM | POA: Diagnosis not present

## 2015-11-11 DIAGNOSIS — I129 Hypertensive chronic kidney disease with stage 1 through stage 4 chronic kidney disease, or unspecified chronic kidney disease: Secondary | ICD-10-CM | POA: Diagnosis not present

## 2015-11-11 DIAGNOSIS — N183 Chronic kidney disease, stage 3 (moderate): Secondary | ICD-10-CM | POA: Diagnosis not present

## 2015-11-11 DIAGNOSIS — K5901 Slow transit constipation: Secondary | ICD-10-CM | POA: Diagnosis not present

## 2015-11-13 DIAGNOSIS — M5136 Other intervertebral disc degeneration, lumbar region: Secondary | ICD-10-CM | POA: Diagnosis not present

## 2015-11-13 DIAGNOSIS — I83205 Varicose veins of unspecified lower extremity with both ulcer other part of foot and inflammation: Secondary | ICD-10-CM | POA: Diagnosis not present

## 2015-11-13 DIAGNOSIS — R262 Difficulty in walking, not elsewhere classified: Secondary | ICD-10-CM | POA: Diagnosis not present

## 2015-11-13 DIAGNOSIS — R278 Other lack of coordination: Secondary | ICD-10-CM | POA: Diagnosis not present

## 2015-11-13 DIAGNOSIS — M6281 Muscle weakness (generalized): Secondary | ICD-10-CM | POA: Diagnosis not present

## 2015-11-14 DIAGNOSIS — M5136 Other intervertebral disc degeneration, lumbar region: Secondary | ICD-10-CM | POA: Diagnosis not present

## 2015-11-14 DIAGNOSIS — M6281 Muscle weakness (generalized): Secondary | ICD-10-CM | POA: Diagnosis not present

## 2015-11-14 DIAGNOSIS — R262 Difficulty in walking, not elsewhere classified: Secondary | ICD-10-CM | POA: Diagnosis not present

## 2015-11-14 DIAGNOSIS — I83205 Varicose veins of unspecified lower extremity with both ulcer other part of foot and inflammation: Secondary | ICD-10-CM | POA: Diagnosis not present

## 2015-11-14 DIAGNOSIS — R278 Other lack of coordination: Secondary | ICD-10-CM | POA: Diagnosis not present

## 2015-11-15 DIAGNOSIS — R278 Other lack of coordination: Secondary | ICD-10-CM | POA: Diagnosis not present

## 2015-11-15 DIAGNOSIS — M6281 Muscle weakness (generalized): Secondary | ICD-10-CM | POA: Diagnosis not present

## 2015-11-15 DIAGNOSIS — R69 Illness, unspecified: Secondary | ICD-10-CM | POA: Diagnosis not present

## 2015-11-15 DIAGNOSIS — R262 Difficulty in walking, not elsewhere classified: Secondary | ICD-10-CM | POA: Diagnosis not present

## 2015-11-15 DIAGNOSIS — D649 Anemia, unspecified: Secondary | ICD-10-CM | POA: Diagnosis not present

## 2015-11-15 DIAGNOSIS — M5136 Other intervertebral disc degeneration, lumbar region: Secondary | ICD-10-CM | POA: Diagnosis not present

## 2015-11-15 DIAGNOSIS — I83205 Varicose veins of unspecified lower extremity with both ulcer other part of foot and inflammation: Secondary | ICD-10-CM | POA: Diagnosis not present

## 2015-11-16 DIAGNOSIS — M6281 Muscle weakness (generalized): Secondary | ICD-10-CM | POA: Diagnosis not present

## 2015-11-16 DIAGNOSIS — M5136 Other intervertebral disc degeneration, lumbar region: Secondary | ICD-10-CM | POA: Diagnosis not present

## 2015-11-16 DIAGNOSIS — R262 Difficulty in walking, not elsewhere classified: Secondary | ICD-10-CM | POA: Diagnosis not present

## 2015-11-16 DIAGNOSIS — R278 Other lack of coordination: Secondary | ICD-10-CM | POA: Diagnosis not present

## 2015-11-16 DIAGNOSIS — I83205 Varicose veins of unspecified lower extremity with both ulcer other part of foot and inflammation: Secondary | ICD-10-CM | POA: Diagnosis not present

## 2015-11-17 DIAGNOSIS — M5136 Other intervertebral disc degeneration, lumbar region: Secondary | ICD-10-CM | POA: Diagnosis not present

## 2015-11-17 DIAGNOSIS — I83205 Varicose veins of unspecified lower extremity with both ulcer other part of foot and inflammation: Secondary | ICD-10-CM | POA: Diagnosis not present

## 2015-11-17 DIAGNOSIS — M6281 Muscle weakness (generalized): Secondary | ICD-10-CM | POA: Diagnosis not present

## 2015-11-17 DIAGNOSIS — R278 Other lack of coordination: Secondary | ICD-10-CM | POA: Diagnosis not present

## 2015-11-17 DIAGNOSIS — R262 Difficulty in walking, not elsewhere classified: Secondary | ICD-10-CM | POA: Diagnosis not present

## 2015-11-19 DIAGNOSIS — M5136 Other intervertebral disc degeneration, lumbar region: Secondary | ICD-10-CM | POA: Diagnosis not present

## 2015-11-19 DIAGNOSIS — I83205 Varicose veins of unspecified lower extremity with both ulcer other part of foot and inflammation: Secondary | ICD-10-CM | POA: Diagnosis not present

## 2015-11-19 DIAGNOSIS — R278 Other lack of coordination: Secondary | ICD-10-CM | POA: Diagnosis not present

## 2015-11-19 DIAGNOSIS — R262 Difficulty in walking, not elsewhere classified: Secondary | ICD-10-CM | POA: Diagnosis not present

## 2015-11-19 DIAGNOSIS — M6281 Muscle weakness (generalized): Secondary | ICD-10-CM | POA: Diagnosis not present

## 2015-11-20 DIAGNOSIS — M6281 Muscle weakness (generalized): Secondary | ICD-10-CM | POA: Diagnosis not present

## 2015-11-20 DIAGNOSIS — R278 Other lack of coordination: Secondary | ICD-10-CM | POA: Diagnosis not present

## 2015-11-20 DIAGNOSIS — I83205 Varicose veins of unspecified lower extremity with both ulcer other part of foot and inflammation: Secondary | ICD-10-CM | POA: Diagnosis not present

## 2015-11-20 DIAGNOSIS — M5136 Other intervertebral disc degeneration, lumbar region: Secondary | ICD-10-CM | POA: Diagnosis not present

## 2015-11-20 DIAGNOSIS — R262 Difficulty in walking, not elsewhere classified: Secondary | ICD-10-CM | POA: Diagnosis not present

## 2015-11-21 DIAGNOSIS — M5136 Other intervertebral disc degeneration, lumbar region: Secondary | ICD-10-CM | POA: Diagnosis not present

## 2015-11-21 DIAGNOSIS — M6281 Muscle weakness (generalized): Secondary | ICD-10-CM | POA: Diagnosis not present

## 2015-11-21 DIAGNOSIS — R262 Difficulty in walking, not elsewhere classified: Secondary | ICD-10-CM | POA: Diagnosis not present

## 2015-11-21 DIAGNOSIS — I83205 Varicose veins of unspecified lower extremity with both ulcer other part of foot and inflammation: Secondary | ICD-10-CM | POA: Diagnosis not present

## 2015-11-21 DIAGNOSIS — R278 Other lack of coordination: Secondary | ICD-10-CM | POA: Diagnosis not present

## 2015-11-22 DIAGNOSIS — M6281 Muscle weakness (generalized): Secondary | ICD-10-CM | POA: Diagnosis not present

## 2015-11-22 DIAGNOSIS — R262 Difficulty in walking, not elsewhere classified: Secondary | ICD-10-CM | POA: Diagnosis not present

## 2015-11-22 DIAGNOSIS — R278 Other lack of coordination: Secondary | ICD-10-CM | POA: Diagnosis not present

## 2015-11-22 DIAGNOSIS — I83205 Varicose veins of unspecified lower extremity with both ulcer other part of foot and inflammation: Secondary | ICD-10-CM | POA: Diagnosis not present

## 2015-11-22 DIAGNOSIS — M5136 Other intervertebral disc degeneration, lumbar region: Secondary | ICD-10-CM | POA: Diagnosis not present

## 2015-11-23 DIAGNOSIS — J209 Acute bronchitis, unspecified: Secondary | ICD-10-CM | POA: Diagnosis not present

## 2015-11-23 DIAGNOSIS — I83205 Varicose veins of unspecified lower extremity with both ulcer other part of foot and inflammation: Secondary | ICD-10-CM | POA: Diagnosis not present

## 2015-11-23 DIAGNOSIS — R278 Other lack of coordination: Secondary | ICD-10-CM | POA: Diagnosis not present

## 2015-11-23 DIAGNOSIS — M6281 Muscle weakness (generalized): Secondary | ICD-10-CM | POA: Diagnosis not present

## 2015-11-23 DIAGNOSIS — J45909 Unspecified asthma, uncomplicated: Secondary | ICD-10-CM | POA: Diagnosis not present

## 2015-11-23 DIAGNOSIS — I10 Essential (primary) hypertension: Secondary | ICD-10-CM | POA: Diagnosis not present

## 2015-11-23 DIAGNOSIS — R7989 Other specified abnormal findings of blood chemistry: Secondary | ICD-10-CM | POA: Diagnosis not present

## 2015-11-23 DIAGNOSIS — M5136 Other intervertebral disc degeneration, lumbar region: Secondary | ICD-10-CM | POA: Diagnosis not present

## 2015-11-23 DIAGNOSIS — R262 Difficulty in walking, not elsewhere classified: Secondary | ICD-10-CM | POA: Diagnosis not present

## 2015-11-24 DIAGNOSIS — R262 Difficulty in walking, not elsewhere classified: Secondary | ICD-10-CM | POA: Diagnosis not present

## 2015-11-24 DIAGNOSIS — I83205 Varicose veins of unspecified lower extremity with both ulcer other part of foot and inflammation: Secondary | ICD-10-CM | POA: Diagnosis not present

## 2015-11-24 DIAGNOSIS — M6281 Muscle weakness (generalized): Secondary | ICD-10-CM | POA: Diagnosis not present

## 2015-11-24 DIAGNOSIS — M5136 Other intervertebral disc degeneration, lumbar region: Secondary | ICD-10-CM | POA: Diagnosis not present

## 2015-11-24 DIAGNOSIS — R278 Other lack of coordination: Secondary | ICD-10-CM | POA: Diagnosis not present

## 2015-11-26 DIAGNOSIS — M5136 Other intervertebral disc degeneration, lumbar region: Secondary | ICD-10-CM | POA: Diagnosis not present

## 2015-11-26 DIAGNOSIS — I83205 Varicose veins of unspecified lower extremity with both ulcer other part of foot and inflammation: Secondary | ICD-10-CM | POA: Diagnosis not present

## 2015-11-26 DIAGNOSIS — M6281 Muscle weakness (generalized): Secondary | ICD-10-CM | POA: Diagnosis not present

## 2015-11-26 DIAGNOSIS — R262 Difficulty in walking, not elsewhere classified: Secondary | ICD-10-CM | POA: Diagnosis not present

## 2015-11-26 DIAGNOSIS — R278 Other lack of coordination: Secondary | ICD-10-CM | POA: Diagnosis not present

## 2015-11-27 DIAGNOSIS — M6281 Muscle weakness (generalized): Secondary | ICD-10-CM | POA: Diagnosis not present

## 2015-11-27 DIAGNOSIS — I83205 Varicose veins of unspecified lower extremity with both ulcer other part of foot and inflammation: Secondary | ICD-10-CM | POA: Diagnosis not present

## 2015-11-27 DIAGNOSIS — R278 Other lack of coordination: Secondary | ICD-10-CM | POA: Diagnosis not present

## 2015-11-27 DIAGNOSIS — M5136 Other intervertebral disc degeneration, lumbar region: Secondary | ICD-10-CM | POA: Diagnosis not present

## 2015-11-27 DIAGNOSIS — R262 Difficulty in walking, not elsewhere classified: Secondary | ICD-10-CM | POA: Diagnosis not present

## 2015-11-28 DIAGNOSIS — I83205 Varicose veins of unspecified lower extremity with both ulcer other part of foot and inflammation: Secondary | ICD-10-CM | POA: Diagnosis not present

## 2015-11-28 DIAGNOSIS — M6281 Muscle weakness (generalized): Secondary | ICD-10-CM | POA: Diagnosis not present

## 2015-11-28 DIAGNOSIS — R262 Difficulty in walking, not elsewhere classified: Secondary | ICD-10-CM | POA: Diagnosis not present

## 2015-11-28 DIAGNOSIS — R278 Other lack of coordination: Secondary | ICD-10-CM | POA: Diagnosis not present

## 2015-11-28 DIAGNOSIS — D649 Anemia, unspecified: Secondary | ICD-10-CM | POA: Diagnosis not present

## 2015-11-28 DIAGNOSIS — M5136 Other intervertebral disc degeneration, lumbar region: Secondary | ICD-10-CM | POA: Diagnosis not present

## 2015-11-29 DIAGNOSIS — I83205 Varicose veins of unspecified lower extremity with both ulcer other part of foot and inflammation: Secondary | ICD-10-CM | POA: Diagnosis not present

## 2015-11-29 DIAGNOSIS — M5136 Other intervertebral disc degeneration, lumbar region: Secondary | ICD-10-CM | POA: Diagnosis not present

## 2015-11-29 DIAGNOSIS — R262 Difficulty in walking, not elsewhere classified: Secondary | ICD-10-CM | POA: Diagnosis not present

## 2015-11-29 DIAGNOSIS — R278 Other lack of coordination: Secondary | ICD-10-CM | POA: Diagnosis not present

## 2015-11-29 DIAGNOSIS — M6281 Muscle weakness (generalized): Secondary | ICD-10-CM | POA: Diagnosis not present

## 2015-11-30 DIAGNOSIS — I83205 Varicose veins of unspecified lower extremity with both ulcer other part of foot and inflammation: Secondary | ICD-10-CM | POA: Diagnosis not present

## 2015-11-30 DIAGNOSIS — R278 Other lack of coordination: Secondary | ICD-10-CM | POA: Diagnosis not present

## 2015-11-30 DIAGNOSIS — M6281 Muscle weakness (generalized): Secondary | ICD-10-CM | POA: Diagnosis not present

## 2015-11-30 DIAGNOSIS — R262 Difficulty in walking, not elsewhere classified: Secondary | ICD-10-CM | POA: Diagnosis not present

## 2015-11-30 DIAGNOSIS — M5136 Other intervertebral disc degeneration, lumbar region: Secondary | ICD-10-CM | POA: Diagnosis not present

## 2015-12-01 DIAGNOSIS — R278 Other lack of coordination: Secondary | ICD-10-CM | POA: Diagnosis not present

## 2015-12-01 DIAGNOSIS — I83205 Varicose veins of unspecified lower extremity with both ulcer other part of foot and inflammation: Secondary | ICD-10-CM | POA: Diagnosis not present

## 2015-12-01 DIAGNOSIS — R262 Difficulty in walking, not elsewhere classified: Secondary | ICD-10-CM | POA: Diagnosis not present

## 2015-12-01 DIAGNOSIS — M6281 Muscle weakness (generalized): Secondary | ICD-10-CM | POA: Diagnosis not present

## 2015-12-01 DIAGNOSIS — M5136 Other intervertebral disc degeneration, lumbar region: Secondary | ICD-10-CM | POA: Diagnosis not present

## 2015-12-03 DIAGNOSIS — R278 Other lack of coordination: Secondary | ICD-10-CM | POA: Diagnosis not present

## 2015-12-03 DIAGNOSIS — M5136 Other intervertebral disc degeneration, lumbar region: Secondary | ICD-10-CM | POA: Diagnosis not present

## 2015-12-03 DIAGNOSIS — R262 Difficulty in walking, not elsewhere classified: Secondary | ICD-10-CM | POA: Diagnosis not present

## 2015-12-03 DIAGNOSIS — I83205 Varicose veins of unspecified lower extremity with both ulcer other part of foot and inflammation: Secondary | ICD-10-CM | POA: Diagnosis not present

## 2015-12-03 DIAGNOSIS — M6281 Muscle weakness (generalized): Secondary | ICD-10-CM | POA: Diagnosis not present

## 2015-12-04 DIAGNOSIS — I83205 Varicose veins of unspecified lower extremity with both ulcer other part of foot and inflammation: Secondary | ICD-10-CM | POA: Diagnosis not present

## 2015-12-04 DIAGNOSIS — M6281 Muscle weakness (generalized): Secondary | ICD-10-CM | POA: Diagnosis not present

## 2015-12-04 DIAGNOSIS — M5136 Other intervertebral disc degeneration, lumbar region: Secondary | ICD-10-CM | POA: Diagnosis not present

## 2015-12-04 DIAGNOSIS — R262 Difficulty in walking, not elsewhere classified: Secondary | ICD-10-CM | POA: Diagnosis not present

## 2015-12-04 DIAGNOSIS — R278 Other lack of coordination: Secondary | ICD-10-CM | POA: Diagnosis not present

## 2015-12-05 DIAGNOSIS — R278 Other lack of coordination: Secondary | ICD-10-CM | POA: Diagnosis not present

## 2015-12-05 DIAGNOSIS — R262 Difficulty in walking, not elsewhere classified: Secondary | ICD-10-CM | POA: Diagnosis not present

## 2015-12-05 DIAGNOSIS — I83205 Varicose veins of unspecified lower extremity with both ulcer other part of foot and inflammation: Secondary | ICD-10-CM | POA: Diagnosis not present

## 2015-12-05 DIAGNOSIS — M5136 Other intervertebral disc degeneration, lumbar region: Secondary | ICD-10-CM | POA: Diagnosis not present

## 2015-12-05 DIAGNOSIS — M6281 Muscle weakness (generalized): Secondary | ICD-10-CM | POA: Diagnosis not present

## 2015-12-06 DIAGNOSIS — R262 Difficulty in walking, not elsewhere classified: Secondary | ICD-10-CM | POA: Diagnosis not present

## 2015-12-06 DIAGNOSIS — R278 Other lack of coordination: Secondary | ICD-10-CM | POA: Diagnosis not present

## 2015-12-06 DIAGNOSIS — M5136 Other intervertebral disc degeneration, lumbar region: Secondary | ICD-10-CM | POA: Diagnosis not present

## 2015-12-06 DIAGNOSIS — M6281 Muscle weakness (generalized): Secondary | ICD-10-CM | POA: Diagnosis not present

## 2015-12-06 DIAGNOSIS — I83205 Varicose veins of unspecified lower extremity with both ulcer other part of foot and inflammation: Secondary | ICD-10-CM | POA: Diagnosis not present

## 2015-12-07 DIAGNOSIS — R278 Other lack of coordination: Secondary | ICD-10-CM | POA: Diagnosis not present

## 2015-12-07 DIAGNOSIS — R262 Difficulty in walking, not elsewhere classified: Secondary | ICD-10-CM | POA: Diagnosis not present

## 2015-12-07 DIAGNOSIS — I83205 Varicose veins of unspecified lower extremity with both ulcer other part of foot and inflammation: Secondary | ICD-10-CM | POA: Diagnosis not present

## 2015-12-07 DIAGNOSIS — M6281 Muscle weakness (generalized): Secondary | ICD-10-CM | POA: Diagnosis not present

## 2015-12-07 DIAGNOSIS — M5136 Other intervertebral disc degeneration, lumbar region: Secondary | ICD-10-CM | POA: Diagnosis not present

## 2015-12-08 DIAGNOSIS — R278 Other lack of coordination: Secondary | ICD-10-CM | POA: Diagnosis not present

## 2015-12-08 DIAGNOSIS — R262 Difficulty in walking, not elsewhere classified: Secondary | ICD-10-CM | POA: Diagnosis not present

## 2015-12-08 DIAGNOSIS — M5136 Other intervertebral disc degeneration, lumbar region: Secondary | ICD-10-CM | POA: Diagnosis not present

## 2015-12-08 DIAGNOSIS — M6281 Muscle weakness (generalized): Secondary | ICD-10-CM | POA: Diagnosis not present

## 2015-12-08 DIAGNOSIS — I83205 Varicose veins of unspecified lower extremity with both ulcer other part of foot and inflammation: Secondary | ICD-10-CM | POA: Diagnosis not present

## 2015-12-10 DIAGNOSIS — R278 Other lack of coordination: Secondary | ICD-10-CM | POA: Diagnosis not present

## 2015-12-10 DIAGNOSIS — M5136 Other intervertebral disc degeneration, lumbar region: Secondary | ICD-10-CM | POA: Diagnosis not present

## 2015-12-10 DIAGNOSIS — M6281 Muscle weakness (generalized): Secondary | ICD-10-CM | POA: Diagnosis not present

## 2015-12-10 DIAGNOSIS — R262 Difficulty in walking, not elsewhere classified: Secondary | ICD-10-CM | POA: Diagnosis not present

## 2015-12-10 DIAGNOSIS — I83205 Varicose veins of unspecified lower extremity with both ulcer other part of foot and inflammation: Secondary | ICD-10-CM | POA: Diagnosis not present

## 2015-12-11 DIAGNOSIS — M6281 Muscle weakness (generalized): Secondary | ICD-10-CM | POA: Diagnosis not present

## 2015-12-11 DIAGNOSIS — R278 Other lack of coordination: Secondary | ICD-10-CM | POA: Diagnosis not present

## 2015-12-11 DIAGNOSIS — R262 Difficulty in walking, not elsewhere classified: Secondary | ICD-10-CM | POA: Diagnosis not present

## 2015-12-11 DIAGNOSIS — M5136 Other intervertebral disc degeneration, lumbar region: Secondary | ICD-10-CM | POA: Diagnosis not present

## 2015-12-11 DIAGNOSIS — I83205 Varicose veins of unspecified lower extremity with both ulcer other part of foot and inflammation: Secondary | ICD-10-CM | POA: Diagnosis not present

## 2015-12-12 ENCOUNTER — Telehealth: Payer: Self-pay | Admitting: Cardiology

## 2015-12-12 ENCOUNTER — Encounter: Payer: Medicare Other | Admitting: *Deleted

## 2015-12-12 DIAGNOSIS — M549 Dorsalgia, unspecified: Secondary | ICD-10-CM | POA: Diagnosis not present

## 2015-12-12 DIAGNOSIS — I129 Hypertensive chronic kidney disease with stage 1 through stage 4 chronic kidney disease, or unspecified chronic kidney disease: Secondary | ICD-10-CM | POA: Diagnosis not present

## 2015-12-12 DIAGNOSIS — M6281 Muscle weakness (generalized): Secondary | ICD-10-CM | POA: Diagnosis not present

## 2015-12-12 DIAGNOSIS — N183 Chronic kidney disease, stage 3 (moderate): Secondary | ICD-10-CM | POA: Diagnosis not present

## 2015-12-12 DIAGNOSIS — R278 Other lack of coordination: Secondary | ICD-10-CM | POA: Diagnosis not present

## 2015-12-12 DIAGNOSIS — R262 Difficulty in walking, not elsewhere classified: Secondary | ICD-10-CM | POA: Diagnosis not present

## 2015-12-12 DIAGNOSIS — M5136 Other intervertebral disc degeneration, lumbar region: Secondary | ICD-10-CM | POA: Diagnosis not present

## 2015-12-12 DIAGNOSIS — I83205 Varicose veins of unspecified lower extremity with both ulcer other part of foot and inflammation: Secondary | ICD-10-CM | POA: Diagnosis not present

## 2015-12-12 NOTE — Telephone Encounter (Signed)
LMOVM reminding pt to send remote transmission.   

## 2015-12-13 DIAGNOSIS — R278 Other lack of coordination: Secondary | ICD-10-CM | POA: Diagnosis not present

## 2015-12-13 DIAGNOSIS — R262 Difficulty in walking, not elsewhere classified: Secondary | ICD-10-CM | POA: Diagnosis not present

## 2015-12-13 DIAGNOSIS — M5136 Other intervertebral disc degeneration, lumbar region: Secondary | ICD-10-CM | POA: Diagnosis not present

## 2015-12-13 DIAGNOSIS — I83205 Varicose veins of unspecified lower extremity with both ulcer other part of foot and inflammation: Secondary | ICD-10-CM | POA: Diagnosis not present

## 2015-12-13 DIAGNOSIS — M6281 Muscle weakness (generalized): Secondary | ICD-10-CM | POA: Diagnosis not present

## 2015-12-14 ENCOUNTER — Encounter: Payer: Self-pay | Admitting: Cardiology

## 2015-12-17 DIAGNOSIS — R278 Other lack of coordination: Secondary | ICD-10-CM | POA: Diagnosis not present

## 2015-12-17 DIAGNOSIS — R262 Difficulty in walking, not elsewhere classified: Secondary | ICD-10-CM | POA: Diagnosis not present

## 2015-12-17 DIAGNOSIS — M160 Bilateral primary osteoarthritis of hip: Secondary | ICD-10-CM | POA: Diagnosis not present

## 2015-12-19 DIAGNOSIS — M160 Bilateral primary osteoarthritis of hip: Secondary | ICD-10-CM | POA: Diagnosis not present

## 2015-12-19 DIAGNOSIS — R278 Other lack of coordination: Secondary | ICD-10-CM | POA: Diagnosis not present

## 2015-12-19 DIAGNOSIS — R262 Difficulty in walking, not elsewhere classified: Secondary | ICD-10-CM | POA: Diagnosis not present

## 2015-12-20 DIAGNOSIS — R278 Other lack of coordination: Secondary | ICD-10-CM | POA: Diagnosis not present

## 2015-12-20 DIAGNOSIS — M160 Bilateral primary osteoarthritis of hip: Secondary | ICD-10-CM | POA: Diagnosis not present

## 2015-12-20 DIAGNOSIS — R262 Difficulty in walking, not elsewhere classified: Secondary | ICD-10-CM | POA: Diagnosis not present

## 2015-12-21 DIAGNOSIS — R262 Difficulty in walking, not elsewhere classified: Secondary | ICD-10-CM | POA: Diagnosis not present

## 2015-12-21 DIAGNOSIS — M160 Bilateral primary osteoarthritis of hip: Secondary | ICD-10-CM | POA: Diagnosis not present

## 2015-12-21 DIAGNOSIS — R278 Other lack of coordination: Secondary | ICD-10-CM | POA: Diagnosis not present

## 2015-12-24 DIAGNOSIS — R278 Other lack of coordination: Secondary | ICD-10-CM | POA: Diagnosis not present

## 2015-12-24 DIAGNOSIS — R262 Difficulty in walking, not elsewhere classified: Secondary | ICD-10-CM | POA: Diagnosis not present

## 2015-12-24 DIAGNOSIS — M160 Bilateral primary osteoarthritis of hip: Secondary | ICD-10-CM | POA: Diagnosis not present

## 2015-12-25 DIAGNOSIS — R262 Difficulty in walking, not elsewhere classified: Secondary | ICD-10-CM | POA: Diagnosis not present

## 2015-12-25 DIAGNOSIS — M160 Bilateral primary osteoarthritis of hip: Secondary | ICD-10-CM | POA: Diagnosis not present

## 2015-12-25 DIAGNOSIS — R278 Other lack of coordination: Secondary | ICD-10-CM | POA: Diagnosis not present

## 2015-12-26 DIAGNOSIS — R278 Other lack of coordination: Secondary | ICD-10-CM | POA: Diagnosis not present

## 2015-12-26 DIAGNOSIS — M160 Bilateral primary osteoarthritis of hip: Secondary | ICD-10-CM | POA: Diagnosis not present

## 2015-12-26 DIAGNOSIS — R262 Difficulty in walking, not elsewhere classified: Secondary | ICD-10-CM | POA: Diagnosis not present

## 2015-12-28 DIAGNOSIS — M160 Bilateral primary osteoarthritis of hip: Secondary | ICD-10-CM | POA: Diagnosis not present

## 2015-12-28 DIAGNOSIS — R262 Difficulty in walking, not elsewhere classified: Secondary | ICD-10-CM | POA: Diagnosis not present

## 2015-12-28 DIAGNOSIS — R278 Other lack of coordination: Secondary | ICD-10-CM | POA: Diagnosis not present

## 2015-12-31 DIAGNOSIS — R278 Other lack of coordination: Secondary | ICD-10-CM | POA: Diagnosis not present

## 2015-12-31 DIAGNOSIS — R262 Difficulty in walking, not elsewhere classified: Secondary | ICD-10-CM | POA: Diagnosis not present

## 2015-12-31 DIAGNOSIS — M160 Bilateral primary osteoarthritis of hip: Secondary | ICD-10-CM | POA: Diagnosis not present

## 2016-01-02 DIAGNOSIS — R278 Other lack of coordination: Secondary | ICD-10-CM | POA: Diagnosis not present

## 2016-01-02 DIAGNOSIS — R262 Difficulty in walking, not elsewhere classified: Secondary | ICD-10-CM | POA: Diagnosis not present

## 2016-01-02 DIAGNOSIS — M160 Bilateral primary osteoarthritis of hip: Secondary | ICD-10-CM | POA: Diagnosis not present

## 2016-01-04 DIAGNOSIS — R278 Other lack of coordination: Secondary | ICD-10-CM | POA: Diagnosis not present

## 2016-01-04 DIAGNOSIS — M160 Bilateral primary osteoarthritis of hip: Secondary | ICD-10-CM | POA: Diagnosis not present

## 2016-01-04 DIAGNOSIS — R262 Difficulty in walking, not elsewhere classified: Secondary | ICD-10-CM | POA: Diagnosis not present

## 2016-01-07 DIAGNOSIS — R278 Other lack of coordination: Secondary | ICD-10-CM | POA: Diagnosis not present

## 2016-01-07 DIAGNOSIS — M160 Bilateral primary osteoarthritis of hip: Secondary | ICD-10-CM | POA: Diagnosis not present

## 2016-01-07 DIAGNOSIS — R262 Difficulty in walking, not elsewhere classified: Secondary | ICD-10-CM | POA: Diagnosis not present

## 2016-01-11 DIAGNOSIS — R278 Other lack of coordination: Secondary | ICD-10-CM | POA: Diagnosis not present

## 2016-01-11 DIAGNOSIS — M160 Bilateral primary osteoarthritis of hip: Secondary | ICD-10-CM | POA: Diagnosis not present

## 2016-01-11 DIAGNOSIS — R262 Difficulty in walking, not elsewhere classified: Secondary | ICD-10-CM | POA: Diagnosis not present

## 2016-01-15 IMAGING — CT CT ABD-PELV W/ CM
2 of 5 series · 16 of 46 positions shown, 18 images · IV contrast (READICAT/WATER & [ID] OMNI 300)
Comparison: None.

CLINICAL DATA: Right-sided abdominal pain.

EXAM:
CT ABDOMEN AND PELVIS WITH CONTRAST
TECHNIQUE: Multidetector CT imaging of the abdomen and pelvis was performed
using the standard protocol following bolus administration of
intravenous contrast.
CONTRAST:  100mL OMNIPAQUE IOHEXOL 300 MG/ML  SOLN

[Series 2: abd/pelvis with · axial · 0.77mm/px · z∈[-364,+16]mm · 13 of 86 slices shown, 15 images]
[im 5/86  soft-tissue]
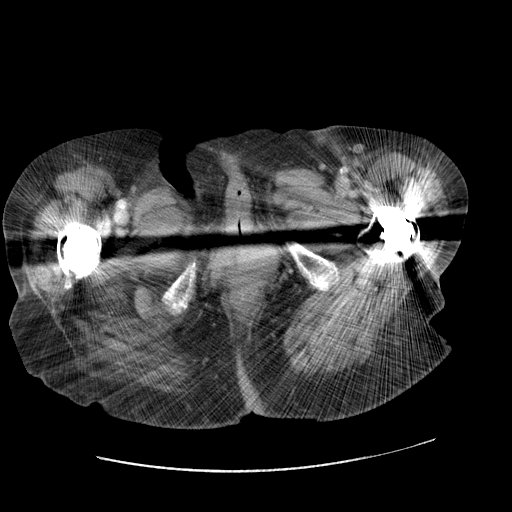
[im 5/86  bone]
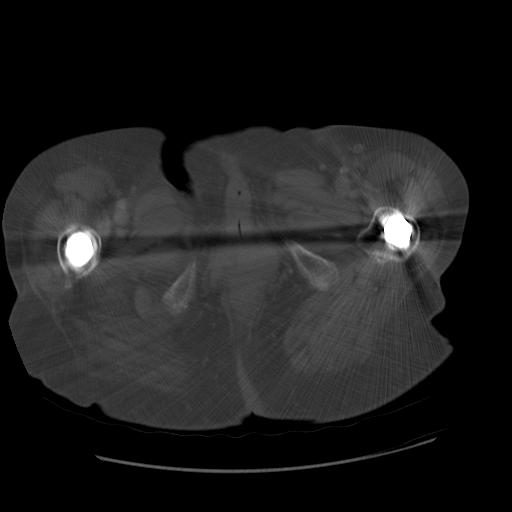
[im 9/86  soft-tissue]
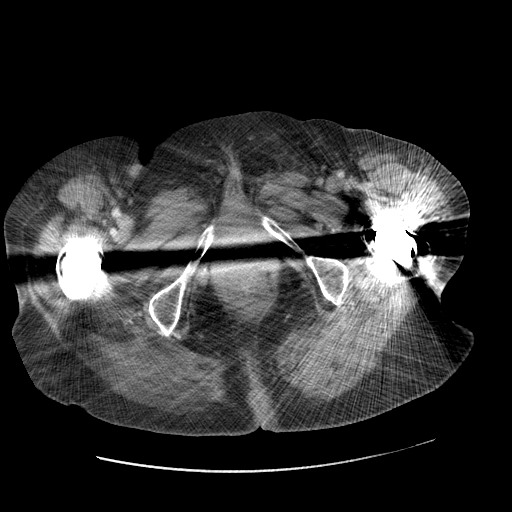
[im 23/86  soft-tissue]
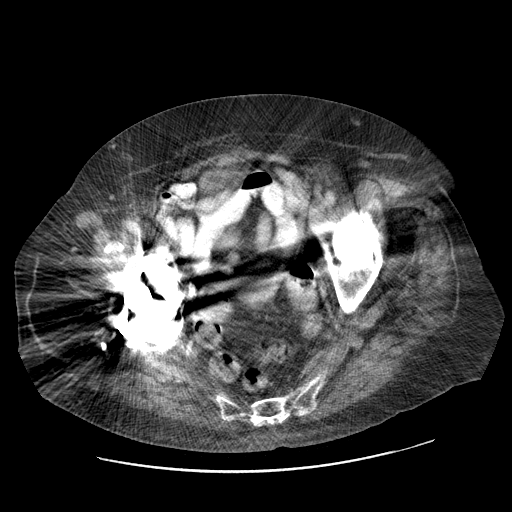
[im 27/86  soft-tissue]
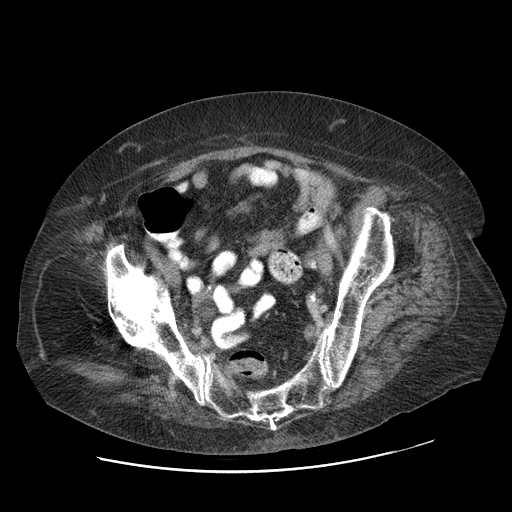
[im 32/86  soft-tissue]
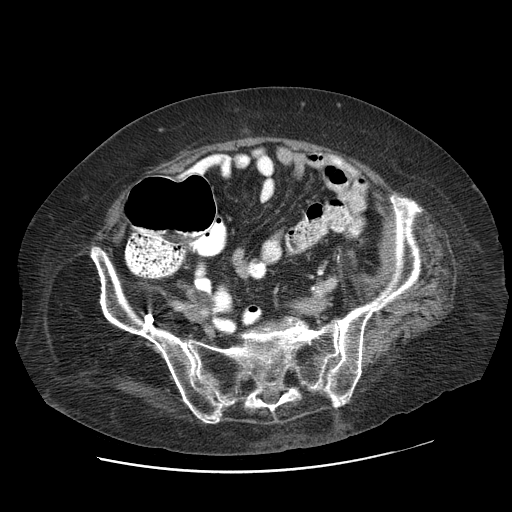
[im 41/86  soft-tissue]
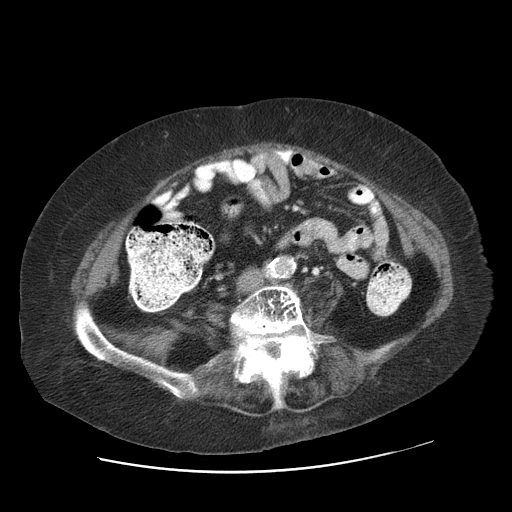
[im 45/86  soft-tissue]
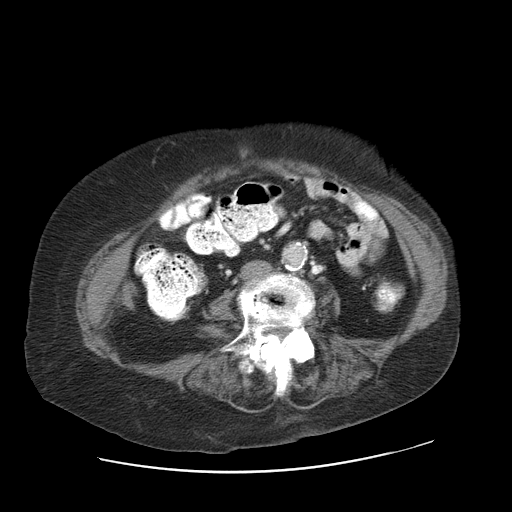
[im 50/86  soft-tissue]
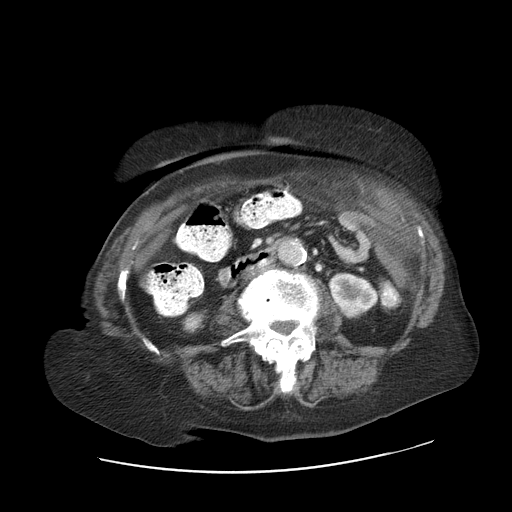
[im 59/86  soft-tissue]
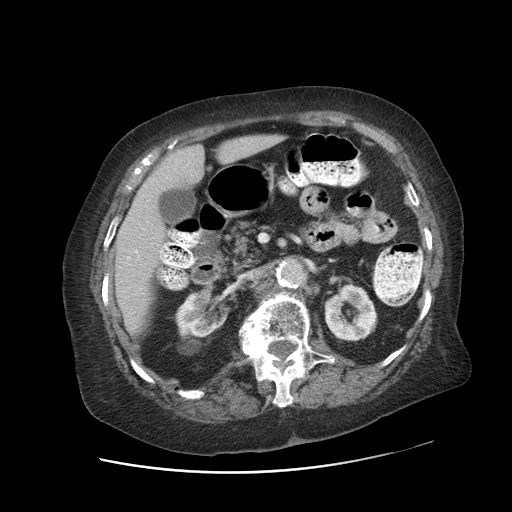
[im 59/86  bone]
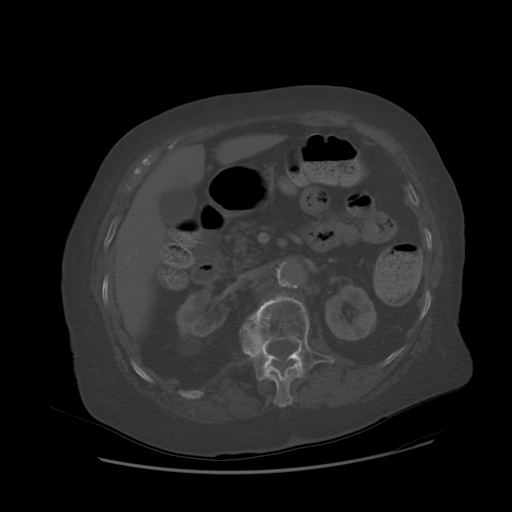
[im 63/86  soft-tissue]
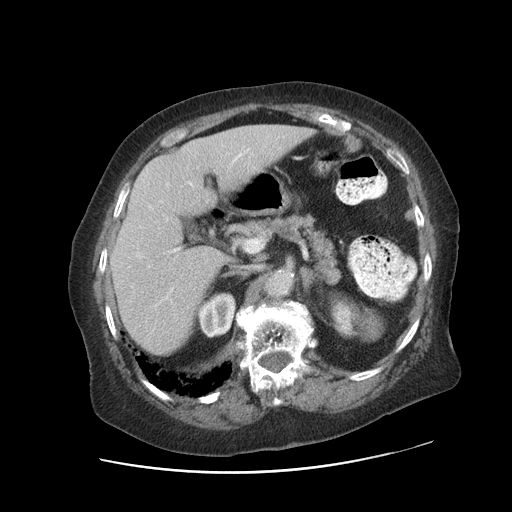
[im 68/86  soft-tissue]
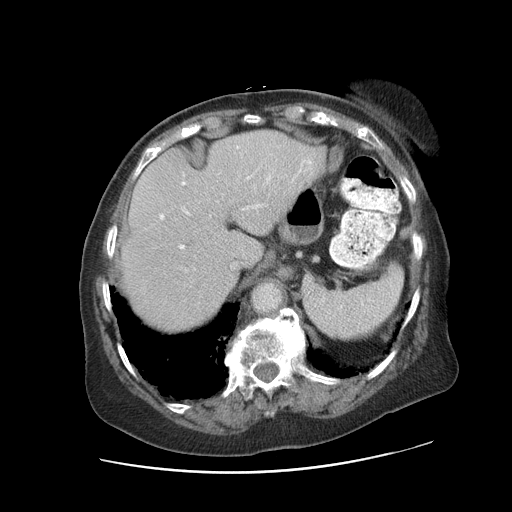
[im 77/86  soft-tissue]
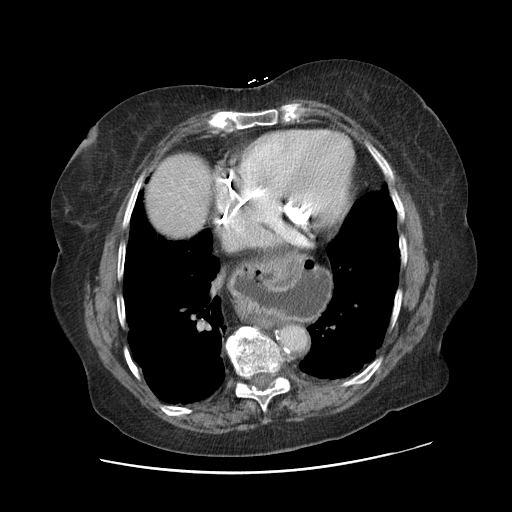
[im 81/86  soft-tissue]
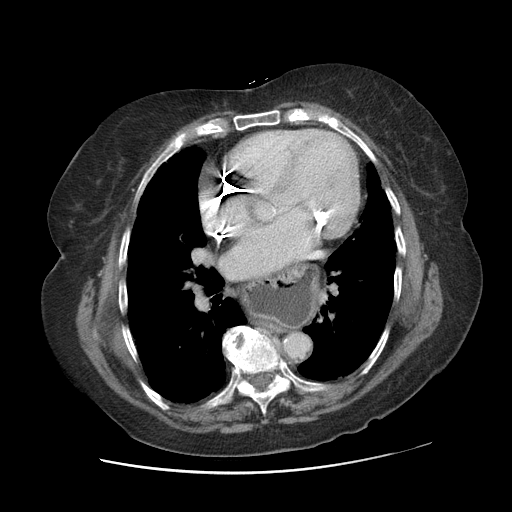

[Series 401: coronal · coronal · 0.85mm/px · 3 of 116 slices shown]
[im 39/116  soft-tissue]
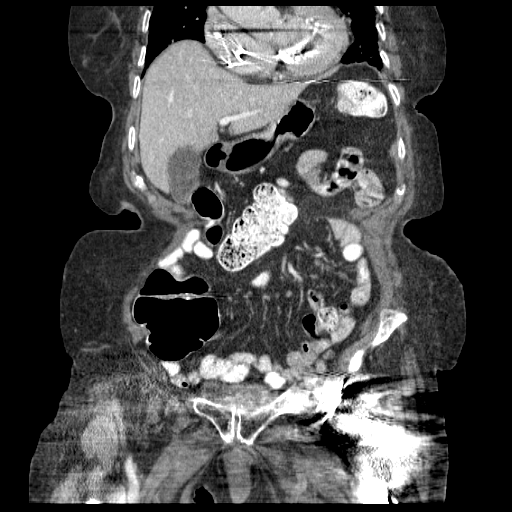
[im 52/116  soft-tissue]
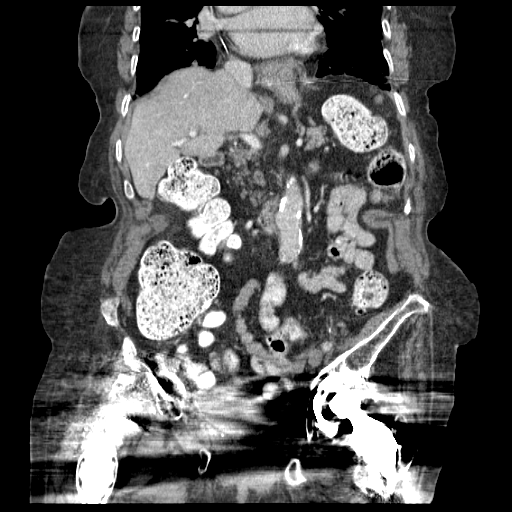
[im 64/116  soft-tissue]
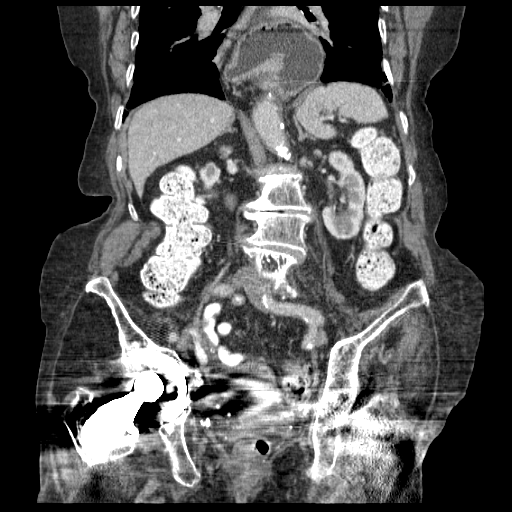

[16 of 46 positions shown; findings below may reference images not displayed]

FINDINGS: Coarse peripheral interstitial opacity in the lung bases favoring
fibrosis. Bronchiectasis noted in both lower lobes. 4 mm right
middle lobe subpleural nodule, image 7 of series 3.

Dense mitral calcification noted.  Pacer leads are present.

Moderate-sized hiatal hernia, type 1. Right kidney upper pole 1.8 x
1.6 cm cyst. Similar density on portal venous and delayed phase
images. There is scarring in the right kidney upper pole.

Faint splenic hypodensity, 0.9 cm on image 20 of series 2, poorly
defined.

Adrenal glands unremarkable. Left kidney normal. No hydronephrosis
or hydroureter.

Aortoiliac atherosclerosis. Bilateral hip implants causing streak
artifact which obscures much of the lower pelvis. Fatty atrophy of
the right gluteus medius and minimus muscles and of the right
iliacus muscle.

Hypodense lesion of the right ovary, approximately 1.9 x by 2.1 cm

Severe lumbar spondylosis and degenerative disc disease noted with
dextroconvex lumbar scoliosis and anterolisthesis at the L3-4, L4-5,
and L5-S1 levels. This results in considerable central stenosis at
L4-5.

Appendix surgically absent. Contrast medium makes way through to the
distal colon.
IMPRESSION: 1. 2 cm hypodense lesion of the right ovary. This falls into the
probably benign category. Accordingly, pelvic ultrasound is
recommended for further characterization. This recommendation
follows ACR consensus guidelines: White Paper of the ACR Incidental
Findings Committee II on Adnexal Findings. [HOSPITAL]
[DATE].
2. Fibrosis in the lung bases with lower lobe bronchiectasis.
3. 4 mm right middle lobe subpleural nodule. If the patient is at
high risk for bronchogenic carcinoma, follow-up chest CT at 1 year
is recommended. If the patient is at low risk, no follow-up is
needed. This recommendation follows the consensus statement:
Guidelines for Management of Small Pulmonary Nodules Detected on CT
Scans: A Statement from the [HOSPITAL] as published in
4. Moderate-sized hiatal hernia.
5. Faint splenic hypodense lesion, 0.9 cm, technically nonspecific
although statistically likely to be a benign lesion such as
hemangioma.
6. Atherosclerosis.
7. Severe lumbar spondylosis and degenerative disc disease, causing
multilevel impingement and considerable central stenosis at L4-5.

## 2016-01-16 DIAGNOSIS — H353212 Exudative age-related macular degeneration, right eye, with inactive choroidal neovascularization: Secondary | ICD-10-CM | POA: Diagnosis not present

## 2016-01-16 DIAGNOSIS — H353122 Nonexudative age-related macular degeneration, left eye, intermediate dry stage: Secondary | ICD-10-CM | POA: Diagnosis not present

## 2016-01-23 DIAGNOSIS — I129 Hypertensive chronic kidney disease with stage 1 through stage 4 chronic kidney disease, or unspecified chronic kidney disease: Secondary | ICD-10-CM | POA: Diagnosis not present

## 2016-01-23 DIAGNOSIS — N189 Chronic kidney disease, unspecified: Secondary | ICD-10-CM | POA: Diagnosis not present

## 2016-01-23 DIAGNOSIS — I480 Paroxysmal atrial fibrillation: Secondary | ICD-10-CM | POA: Diagnosis not present

## 2016-01-23 DIAGNOSIS — E785 Hyperlipidemia, unspecified: Secondary | ICD-10-CM | POA: Diagnosis not present

## 2016-01-23 DIAGNOSIS — I495 Sick sinus syndrome: Secondary | ICD-10-CM | POA: Diagnosis not present

## 2016-03-04 ENCOUNTER — Telehealth: Payer: Self-pay | Admitting: *Deleted

## 2016-03-04 ENCOUNTER — Ambulatory Visit (INDEPENDENT_AMBULATORY_CARE_PROVIDER_SITE_OTHER): Payer: Medicare Other | Admitting: *Deleted

## 2016-03-04 ENCOUNTER — Telehealth: Payer: Self-pay | Admitting: Cardiology

## 2016-03-04 DIAGNOSIS — I495 Sick sinus syndrome: Secondary | ICD-10-CM | POA: Diagnosis not present

## 2016-03-04 NOTE — Telephone Encounter (Signed)
Spoke with pt and reminded pt of remote transmission that is due today. Pt verbalized understanding.   

## 2016-03-04 NOTE — Telephone Encounter (Signed)
°  1. Has your device fired? no ° °2. Is you device beeping? no ° °3. Are you experiencing draining or swelling at device site? no ° °4. Are you calling to see if we received your device transmission? yes ° °5. Have you passed out? no ° °

## 2016-03-04 NOTE — Telephone Encounter (Signed)
Informed pt that we received her remote transmission. Pt verbalized understanding.

## 2016-03-05 ENCOUNTER — Encounter: Payer: Self-pay | Admitting: Cardiology

## 2016-03-05 NOTE — Progress Notes (Signed)
Remote pacemaker transmission.   

## 2016-03-13 LAB — CUP PACEART REMOTE DEVICE CHECK
Battery Remaining Longevity: 99 mo
Battery Remaining Percentage: 81 %
Battery Voltage: 2.93 V
Brady Statistic AP VP Percent: 1 %
Brady Statistic AP VS Percent: 60 %
Brady Statistic AS VP Percent: 1 %
Brady Statistic AS VS Percent: 39 %
Brady Statistic RA Percent Paced: 51 %
Brady Statistic RV Percent Paced: 2.7 %
Date Time Interrogation Session: 20180130191235
Implantable Lead Implant Date: 20130402
Implantable Lead Implant Date: 20130402
Implantable Lead Location: 753859
Implantable Lead Location: 753860
Implantable Lead Model: 1948
Implantable Pulse Generator Implant Date: 20130402
Lead Channel Impedance Value: 360 Ohm
Lead Channel Impedance Value: 660 Ohm
Lead Channel Pacing Threshold Amplitude: 0.625 V
Lead Channel Pacing Threshold Amplitude: 0.75 V
Lead Channel Pacing Threshold Pulse Width: 0.5 ms
Lead Channel Pacing Threshold Pulse Width: 0.5 ms
Lead Channel Sensing Intrinsic Amplitude: 0.6 mV
Lead Channel Sensing Intrinsic Amplitude: 12 mV
Lead Channel Setting Pacing Amplitude: 1.625
Lead Channel Setting Pacing Amplitude: 2.5 V
Lead Channel Setting Pacing Pulse Width: 0.5 ms
Lead Channel Setting Sensing Sensitivity: 2 mV
Pulse Gen Model: 2110
Pulse Gen Serial Number: 7317017

## 2016-03-27 DIAGNOSIS — Z79899 Other long term (current) drug therapy: Secondary | ICD-10-CM | POA: Diagnosis not present

## 2016-03-27 DIAGNOSIS — M5432 Sciatica, left side: Secondary | ICD-10-CM | POA: Diagnosis not present

## 2016-03-27 DIAGNOSIS — Z0000 Encounter for general adult medical examination without abnormal findings: Secondary | ICD-10-CM | POA: Diagnosis not present

## 2016-03-27 DIAGNOSIS — I700 Atherosclerosis of aorta: Secondary | ICD-10-CM | POA: Diagnosis not present

## 2016-03-27 DIAGNOSIS — Z1389 Encounter for screening for other disorder: Secondary | ICD-10-CM | POA: Diagnosis not present

## 2016-03-27 DIAGNOSIS — E7800 Pure hypercholesterolemia, unspecified: Secondary | ICD-10-CM | POA: Diagnosis not present

## 2016-03-27 DIAGNOSIS — N183 Chronic kidney disease, stage 3 (moderate): Secondary | ICD-10-CM | POA: Diagnosis not present

## 2016-03-27 DIAGNOSIS — I129 Hypertensive chronic kidney disease with stage 1 through stage 4 chronic kidney disease, or unspecified chronic kidney disease: Secondary | ICD-10-CM | POA: Diagnosis not present

## 2016-03-27 DIAGNOSIS — I480 Paroxysmal atrial fibrillation: Secondary | ICD-10-CM | POA: Diagnosis not present

## 2016-03-27 DIAGNOSIS — E222 Syndrome of inappropriate secretion of antidiuretic hormone: Secondary | ICD-10-CM | POA: Diagnosis not present

## 2016-06-18 ENCOUNTER — Encounter: Payer: Self-pay | Admitting: Internal Medicine

## 2016-06-18 ENCOUNTER — Ambulatory Visit (INDEPENDENT_AMBULATORY_CARE_PROVIDER_SITE_OTHER): Payer: Medicare Other | Admitting: Internal Medicine

## 2016-06-18 VITALS — BP 144/60 | HR 62 | Ht 64.0 in | Wt 144.0 lb

## 2016-06-18 DIAGNOSIS — I480 Paroxysmal atrial fibrillation: Secondary | ICD-10-CM

## 2016-06-18 DIAGNOSIS — I495 Sick sinus syndrome: Secondary | ICD-10-CM | POA: Diagnosis not present

## 2016-06-18 DIAGNOSIS — Z950 Presence of cardiac pacemaker: Secondary | ICD-10-CM

## 2016-06-18 DIAGNOSIS — I4510 Unspecified right bundle-branch block: Secondary | ICD-10-CM | POA: Diagnosis not present

## 2016-06-18 NOTE — Progress Notes (Signed)
Patient Care Team: Lajean Manes, MD as PCP - General (Internal Medicine)   HPI  Julie Mcpherson is a 83 y.o. female Seen following pacemaker implantation April 2013 for atrial fibrillation with post termination pauses.   She denies chest pain or shortness of breath. She has chronic lower extremity right greater than left edema. She had some issues with back pain that caused her to become nonambulatory. She is much improved at this time.  She tells me her son with melanoma is doing better; played golf.  He is now 3 years out  Past Medical History:  Diagnosis Date  . Arthritis   . Atrial fibrillation ([place])   . Blood transfusion   . Chronic kidney disease    stage 3 renal disease  . Diverticular disease   . DJD (degenerative joint disease)   . DVT (deep venous thrombosis) ([place]) early 1980's   left medial upper thigh  . Fracture, shoulder ~ 2003; 1997   left; right  . GERD (gastroesophageal reflux disease)   . High cholesterol   . Hypertension   . Overactive bladder   . Pacemaker   . Skin cancer of face   . Syncope secondary to post termination pauses 05/03/11   "passed out; first time ever"  . Urinary frequency     Past Surgical History:  Procedure Laterality Date  . APPENDECTOMY  ~ 1938  . BLADDER SUSPENSION  1985  . CARPAL TUNNEL RELEASE  04/18/2011   right; Procedure: CARPAL TUNNEL RELEASE;  Surgeon: Cammie Sickle., MD;  Location: [place];  Service: Orthopedics;  Laterality: Right;  . CARPAL TUNNEL RELEASE  12/05/2011   Procedure: CARPAL TUNNEL RELEASE;  Surgeon: Cammie Sickle., MD;  Location: [place];  Service: Orthopedics;  Laterality: Left;  . CATARACT EXTRACTION W/ INTRAOCULAR LENS  IMPLANT, BILATERAL    . HERNIA REPAIR  1986   "stomach"  . JOINT REPLACEMENT     hips  . PERMANENT PACEMAKER INSERTION N/A 05/06/2011   Procedure: PERMANENT PACEMAKER INSERTION;  Surgeon: Deboraha Sprang, MD;  Location: [facility] CATH  LAB;  Service: Cardiovascular;  Laterality: N/A;  . SKIN CANCER EXCISION  2012   "twice"  . TEMPORARY PACEMAKER INSERTION N/A 05/05/2011   Procedure: TEMPORARY PACEMAKER INSERTION;  Surgeon: Clent Demark, MD;  Location: [place] CATH LAB;  Service: Cardiovascular;  Laterality: N/A;  . TOTAL HIP ARTHROPLASTY     "2 on right; 1 on left"    Current Outpatient Prescriptions  Medication Sig Dispense Refill  . acetaminophen (TYLENOL) 500 MG tablet Take 500 mg by mouth 2 (two) times daily.    [name] amLODipine (NORVASC) 5 MG tablet Take 5 mg by mouth daily.    [name] aspirin 81 MG tablet Take 81 mg by mouth daily.    . calcium carbonate (OS-CAL) 600 MG TABS Take 600 mg by mouth daily with breakfast.     . cholecalciferol (VITAMIN D) 1000 UNITS tablet Take 1,000 Units by mouth daily.    [name] FERROUS SULFATE PO Take 1 tablet by mouth 2 (two) times a week.    . fish oil-omega-3 fatty acids 1000 MG capsule Take 1 g by mouth daily.    [name] gabapentin (NEURONTIN) 100 MG capsule Take 1 capsule (100 mg total) by mouth 3 (three) times daily. 30 capsule 0  . HYDROcodone-acetaminophen (NORCO/VICODIN) 5-325 MG tablet Take 1 tablet by mouth every 4 (four) hours as needed. 10 tablet 0  .  metoprolol succinate (TOPROL-XL) 25 MG 24 hr tablet Take 25 mg by mouth daily.    . Multiple Vitamin (MULTIVITAMIN WITH MINERALS) TABS Take 1 tablet by mouth daily.    [name] MYRBETRIQ 50 MG TB24 tablet Take 1 tablet by mouth daily.    [name] omeprazole (PRILOSEC) 40 MG capsule Take 40 mg by mouth daily.    [name] spironolactone (ALDACTONE) 25 MG tablet Take 12.5 mg by mouth daily.     No current facility-administered medications for this visit.     Allergies  Allergen Reactions  . Codeine Other (See Comments)    "can't sleep; I climb the walls"  . Statins Other (See Comments)    "ocular migraine headaches"  . Sulfa Antibiotics Anaphylaxis, Itching and Rash  . Tetanus Toxoids Other (See Comments)    "think it started turning red around where I got  shot; they gave me small doses and I did fine"    Review of Systems negative except from HPI and PMH  Physical Exam BP (!) 144/60   Pulse 62   Ht 5\' 4"  (1.626 m)   Wt 144 lb (65.3 kg)   SpO2 98%   BMI 24.72 kg/m  Well developed and nourished in no acute distress HENT normal Neck supple with JVP-flat Carotids brisk and full without bruits Clear Regular rate and rhythm, no murmurs or gallops Abd-soft with active BS without hepatomegaly No Clubbing cyanosis edema Skin-warm and dry A & Oriented  Grossly normal sensory and motor function  ECG demonstrates sinus rhythm at 63 Intervals 20/14/44 Right bundle branch block -69   Assessment and  Plan  Atrial fibrillation with posttermination pauses  Pacemaker-[employer] The patient's device was interrogated.  The information was reviewed. No changes were made in the programming.     Hypertension  Right bundle branch block-new  Interval atrial fibrillation. Anticoagulation decisions have been deferred to primary cardiologist. Based on recent studies from [place] would be my recommendation to anticoagulate because of that clinical benefit particularly compared to aspirin  Blood pressure reasonably controlled

## 2016-06-18 NOTE — Patient Instructions (Signed)
Medication Instructions: - Your physician recommends that you continue on your current medications as directed. Please refer to the Current Medication list given to you today.  Labwork: - none ordered  Procedures/Testing: - none ordered  Follow-Up: - Remote monitoring is used to monitor your Pacemaker of ICD from home. This monitoring reduces the number of office visits required to check your device to one time per year. It allows [place] to keep an eye on the functioning of your device to ensure it is working properly. You are scheduled for a device check from home on 09/17/16. You may send your transmission at any time that day. If you have a wireless device, the transmission will be sent automatically. After your physician reviews your transmission, you will receive a postcard with your next transmission date.  - Your physician wants you to follow-up in: 1 year with Tommye Standard, PA for Dr. Caryl Comes. You will receive a reminder letter in the mail two months in advance. If you don't receive a letter, please call our office to schedule the follow-up appointment.   Any Additional Special Instructions Will Be Listed Below (If Applicable).     If you need a refill on your cardiac medications before your next appointment, please call your pharmacy.

## 2016-06-20 LAB — CUP PACEART INCLINIC DEVICE CHECK
Battery Voltage: 2.92 V
Brady Statistic RA Percent Paced: 51 %
Brady Statistic RV Percent Paced: 4.1 %
Date Time Interrogation Session: 20180516141212
Implantable Lead Implant Date: 20130402
Implantable Lead Implant Date: 20130402
Implantable Lead Location: 753859
Implantable Lead Location: 753860
Implantable Lead Model: 1948
Implantable Pulse Generator Implant Date: 20130402
Lead Channel Impedance Value: 400 Ohm
Lead Channel Impedance Value: 675 Ohm
Lead Channel Pacing Threshold Amplitude: 0.75 V
Lead Channel Pacing Threshold Amplitude: 0.75 V
Lead Channel Pacing Threshold Amplitude: 0.75 V
Lead Channel Pacing Threshold Amplitude: 0.75 V
Lead Channel Pacing Threshold Pulse Width: 0.5 ms
Lead Channel Pacing Threshold Pulse Width: 0.5 ms
Lead Channel Pacing Threshold Pulse Width: 0.5 ms
Lead Channel Pacing Threshold Pulse Width: 0.5 ms
Lead Channel Sensing Intrinsic Amplitude: 0.8 mV
Lead Channel Sensing Intrinsic Amplitude: 12 mV
Lead Channel Setting Pacing Amplitude: 1.625
Lead Channel Setting Pacing Amplitude: 2.5 V
Lead Channel Setting Pacing Pulse Width: 0.5 ms
Lead Channel Setting Sensing Sensitivity: 2 mV
Pulse Gen Model: 2110
Pulse Gen Serial Number: 7317017

## 2016-07-22 DIAGNOSIS — I495 Sick sinus syndrome: Secondary | ICD-10-CM | POA: Diagnosis not present

## 2016-07-22 DIAGNOSIS — E785 Hyperlipidemia, unspecified: Secondary | ICD-10-CM | POA: Diagnosis not present

## 2016-07-22 DIAGNOSIS — I480 Paroxysmal atrial fibrillation: Secondary | ICD-10-CM | POA: Diagnosis not present

## 2016-07-22 DIAGNOSIS — N189 Chronic kidney disease, unspecified: Secondary | ICD-10-CM | POA: Diagnosis not present

## 2016-07-22 DIAGNOSIS — I129 Hypertensive chronic kidney disease with stage 1 through stage 4 chronic kidney disease, or unspecified chronic kidney disease: Secondary | ICD-10-CM | POA: Diagnosis not present

## 2016-07-23 DIAGNOSIS — H353122 Nonexudative age-related macular degeneration, left eye, intermediate dry stage: Secondary | ICD-10-CM | POA: Diagnosis not present

## 2016-07-23 DIAGNOSIS — H353212 Exudative age-related macular degeneration, right eye, with inactive choroidal neovascularization: Secondary | ICD-10-CM | POA: Diagnosis not present

## 2016-07-23 DIAGNOSIS — H43813 Vitreous degeneration, bilateral: Secondary | ICD-10-CM | POA: Diagnosis not present

## 2016-08-21 DIAGNOSIS — D0471 Carcinoma in situ of skin of right lower limb, including hip: Secondary | ICD-10-CM | POA: Diagnosis not present

## 2016-08-21 DIAGNOSIS — L821 Other seborrheic keratosis: Secondary | ICD-10-CM | POA: Diagnosis not present

## 2016-08-21 DIAGNOSIS — D485 Neoplasm of uncertain behavior of skin: Secondary | ICD-10-CM | POA: Diagnosis not present

## 2016-08-21 DIAGNOSIS — Z85828 Personal history of other malignant neoplasm of skin: Secondary | ICD-10-CM | POA: Diagnosis not present

## 2016-09-17 ENCOUNTER — Telehealth: Payer: Self-pay | Admitting: Internal Medicine

## 2016-09-17 ENCOUNTER — Ambulatory Visit (INDEPENDENT_AMBULATORY_CARE_PROVIDER_SITE_OTHER): Payer: Medicare Other | Admitting: *Deleted

## 2016-09-17 DIAGNOSIS — I495 Sick sinus syndrome: Secondary | ICD-10-CM | POA: Diagnosis not present

## 2016-09-17 DIAGNOSIS — I480 Paroxysmal atrial fibrillation: Secondary | ICD-10-CM

## 2016-09-17 NOTE — Telephone Encounter (Signed)
Pt having issues sending transmission, number given to [place] to further troubleshoot monitor.

## 2016-09-17 NOTE — Telephone Encounter (Signed)
Spoke with Julie Mcpherson, she stated that she pressed to button once, informed her that she needed to press the button twice in order to get a transmission to come through. She stated she would try after lunch and call back.

## 2016-09-17 NOTE — Telephone Encounter (Signed)
F/u message  Pt trying to send a transmission. Please call back to assist.

## 2016-09-17 NOTE — Telephone Encounter (Signed)
New message    Pt wants to know if transmission was received

## 2016-09-18 NOTE — Telephone Encounter (Signed)
New Message    Per son they can not get device to transmit

## 2016-09-18 NOTE — Telephone Encounter (Signed)
Spoke with patient's son.  Advised that transmission was received today at 1103.  Patient's son verbalizes understanding and appreciation.  He denies questions or concerns at this time.

## 2016-09-19 NOTE — Progress Notes (Signed)
Remote pacemaker check. 

## 2016-09-25 DIAGNOSIS — R600 Localized edema: Secondary | ICD-10-CM | POA: Diagnosis not present

## 2016-09-25 DIAGNOSIS — I129 Hypertensive chronic kidney disease with stage 1 through stage 4 chronic kidney disease, or unspecified chronic kidney disease: Secondary | ICD-10-CM | POA: Diagnosis not present

## 2016-09-25 DIAGNOSIS — N3941 Urge incontinence: Secondary | ICD-10-CM | POA: Diagnosis not present

## 2016-09-25 DIAGNOSIS — M5432 Sciatica, left side: Secondary | ICD-10-CM | POA: Diagnosis not present

## 2016-09-25 DIAGNOSIS — E222 Syndrome of inappropriate secretion of antidiuretic hormone: Secondary | ICD-10-CM | POA: Diagnosis not present

## 2016-09-25 DIAGNOSIS — N183 Chronic kidney disease, stage 3 (moderate): Secondary | ICD-10-CM | POA: Diagnosis not present

## 2016-09-26 LAB — CUP PACEART REMOTE DEVICE CHECK
Brady Statistic RA Percent Paced: 57 %
Brady Statistic RV Percent Paced: 6.6 %
Date Time Interrogation Session: 20180824152917
Implantable Lead Implant Date: 20130402
Implantable Lead Implant Date: 20130402
Implantable Lead Location: 753859
Implantable Lead Location: 753860
Implantable Lead Model: 1948
Implantable Pulse Generator Implant Date: 20130402
Lead Channel Impedance Value: 410 Ohm
Lead Channel Impedance Value: 690 Ohm
Lead Channel Pacing Threshold Amplitude: 0.625 V
Lead Channel Pacing Threshold Pulse Width: 0.5 ms
Lead Channel Sensing Intrinsic Amplitude: 1.2 mV
Lead Channel Sensing Intrinsic Amplitude: 12 mV
Lead Channel Setting Pacing Amplitude: 1.625
Lead Channel Setting Pacing Amplitude: 2.5 V
Lead Channel Setting Pacing Pulse Width: 0.5 ms
Lead Channel Setting Sensing Sensitivity: 2 mV
Pulse Gen Model: 2110
Pulse Gen Serial Number: 7317017

## 2016-09-30 ENCOUNTER — Encounter: Payer: Self-pay | Admitting: Cardiology

## 2016-10-07 DIAGNOSIS — M25551 Pain in right hip: Secondary | ICD-10-CM | POA: Diagnosis not present

## 2016-10-07 DIAGNOSIS — M1712 Unilateral primary osteoarthritis, left knee: Secondary | ICD-10-CM | POA: Diagnosis not present

## 2016-10-07 DIAGNOSIS — M1711 Unilateral primary osteoarthritis, right knee: Secondary | ICD-10-CM | POA: Diagnosis not present

## 2016-10-07 DIAGNOSIS — M7061 Trochanteric bursitis, right hip: Secondary | ICD-10-CM | POA: Diagnosis not present

## 2016-10-21 DIAGNOSIS — I129 Hypertensive chronic kidney disease with stage 1 through stage 4 chronic kidney disease, or unspecified chronic kidney disease: Secondary | ICD-10-CM | POA: Diagnosis not present

## 2016-10-21 DIAGNOSIS — E785 Hyperlipidemia, unspecified: Secondary | ICD-10-CM | POA: Diagnosis not present

## 2016-10-21 DIAGNOSIS — N189 Chronic kidney disease, unspecified: Secondary | ICD-10-CM | POA: Diagnosis not present

## 2016-10-21 DIAGNOSIS — I480 Paroxysmal atrial fibrillation: Secondary | ICD-10-CM | POA: Diagnosis not present

## 2016-10-21 DIAGNOSIS — I495 Sick sinus syndrome: Secondary | ICD-10-CM | POA: Diagnosis not present

## 2016-12-01 DIAGNOSIS — Z23 Encounter for immunization: Secondary | ICD-10-CM | POA: Diagnosis not present

## 2016-12-18 ENCOUNTER — Telehealth: Payer: Self-pay | Admitting: Cardiology

## 2016-12-18 ENCOUNTER — Ambulatory Visit (INDEPENDENT_AMBULATORY_CARE_PROVIDER_SITE_OTHER): Payer: Medicare Other | Admitting: *Deleted

## 2016-12-18 DIAGNOSIS — I495 Sick sinus syndrome: Secondary | ICD-10-CM

## 2016-12-18 NOTE — Telephone Encounter (Signed)
Spoke with pt and reminded pt of remote transmission that is due today. Pt verbalized understanding.   

## 2016-12-19 NOTE — Progress Notes (Signed)
Remote pacemaker transmission.   

## 2016-12-24 ENCOUNTER — Encounter: Payer: Self-pay | Admitting: Cardiology

## 2017-01-05 LAB — CUP PACEART REMOTE DEVICE CHECK
Battery Remaining Longevity: 80 mo
Battery Remaining Percentage: 65 %
Battery Voltage: 2.9 V
Brady Statistic AP VP Percent: 1 %
Brady Statistic AP VS Percent: 76 %
Brady Statistic AS VP Percent: 1 %
Brady Statistic AS VS Percent: 24 %
Brady Statistic RA Percent Paced: 60 %
Brady Statistic RV Percent Paced: 6.1 %
Date Time Interrogation Session: 20181115142424
Implantable Lead Implant Date: 20130402
Implantable Lead Implant Date: 20130402
Implantable Lead Location: 753859
Implantable Lead Location: 753860
Implantable Lead Model: 1948
Implantable Pulse Generator Implant Date: 20130402
Lead Channel Impedance Value: 410 Ohm
Lead Channel Impedance Value: 690 Ohm
Lead Channel Pacing Threshold Amplitude: 0.625 V
Lead Channel Pacing Threshold Amplitude: 0.75 V
Lead Channel Pacing Threshold Pulse Width: 0.5 ms
Lead Channel Pacing Threshold Pulse Width: 0.5 ms
Lead Channel Sensing Intrinsic Amplitude: 1.8 mV
Lead Channel Sensing Intrinsic Amplitude: 12 mV
Lead Channel Setting Pacing Amplitude: 1.625
Lead Channel Setting Pacing Amplitude: 2.5 V
Lead Channel Setting Pacing Pulse Width: 0.5 ms
Lead Channel Setting Sensing Sensitivity: 2 mV
Pulse Gen Model: 2110
Pulse Gen Serial Number: 7317017

## 2017-01-20 DIAGNOSIS — H353122 Nonexudative age-related macular degeneration, left eye, intermediate dry stage: Secondary | ICD-10-CM | POA: Diagnosis not present

## 2017-01-20 DIAGNOSIS — H43813 Vitreous degeneration, bilateral: Secondary | ICD-10-CM | POA: Diagnosis not present

## 2017-01-20 DIAGNOSIS — H353212 Exudative age-related macular degeneration, right eye, with inactive choroidal neovascularization: Secondary | ICD-10-CM | POA: Diagnosis not present

## 2017-01-20 DIAGNOSIS — H35371 Puckering of macula, right eye: Secondary | ICD-10-CM | POA: Diagnosis not present

## 2017-03-19 ENCOUNTER — Ambulatory Visit (INDEPENDENT_AMBULATORY_CARE_PROVIDER_SITE_OTHER): Payer: Medicare Other | Admitting: *Deleted

## 2017-03-19 ENCOUNTER — Telehealth: Payer: Self-pay | Admitting: Cardiology

## 2017-03-19 DIAGNOSIS — I700 Atherosclerosis of aorta: Secondary | ICD-10-CM | POA: Diagnosis not present

## 2017-03-19 DIAGNOSIS — I480 Paroxysmal atrial fibrillation: Secondary | ICD-10-CM | POA: Diagnosis not present

## 2017-03-19 DIAGNOSIS — K219 Gastro-esophageal reflux disease without esophagitis: Secondary | ICD-10-CM | POA: Diagnosis not present

## 2017-03-19 DIAGNOSIS — I495 Sick sinus syndrome: Secondary | ICD-10-CM | POA: Diagnosis not present

## 2017-03-19 DIAGNOSIS — L989 Disorder of the skin and subcutaneous tissue, unspecified: Secondary | ICD-10-CM | POA: Diagnosis not present

## 2017-03-19 DIAGNOSIS — Z0000 Encounter for general adult medical examination without abnormal findings: Secondary | ICD-10-CM | POA: Diagnosis not present

## 2017-03-19 DIAGNOSIS — I129 Hypertensive chronic kidney disease with stage 1 through stage 4 chronic kidney disease, or unspecified chronic kidney disease: Secondary | ICD-10-CM | POA: Diagnosis not present

## 2017-03-19 DIAGNOSIS — E222 Syndrome of inappropriate secretion of antidiuretic hormone: Secondary | ICD-10-CM | POA: Diagnosis not present

## 2017-03-19 DIAGNOSIS — N183 Chronic kidney disease, stage 3 (moderate): Secondary | ICD-10-CM | POA: Diagnosis not present

## 2017-03-19 DIAGNOSIS — Z1389 Encounter for screening for other disorder: Secondary | ICD-10-CM | POA: Diagnosis not present

## 2017-03-19 DIAGNOSIS — Z79899 Other long term (current) drug therapy: Secondary | ICD-10-CM | POA: Diagnosis not present

## 2017-03-19 NOTE — Telephone Encounter (Signed)
Spoke with pt and reminded pt of remote transmission that is due today. Pt verbalized understanding.   

## 2017-03-19 NOTE — Progress Notes (Signed)
Remote pacemaker transmission.   

## 2017-03-23 DIAGNOSIS — L720 Epidermal cyst: Secondary | ICD-10-CM | POA: Diagnosis not present

## 2017-03-23 DIAGNOSIS — L821 Other seborrheic keratosis: Secondary | ICD-10-CM | POA: Diagnosis not present

## 2017-03-23 DIAGNOSIS — C44319 Basal cell carcinoma of skin of other parts of face: Secondary | ICD-10-CM | POA: Diagnosis not present

## 2017-03-23 DIAGNOSIS — Z85828 Personal history of other malignant neoplasm of skin: Secondary | ICD-10-CM | POA: Diagnosis not present

## 2017-03-23 DIAGNOSIS — D485 Neoplasm of uncertain behavior of skin: Secondary | ICD-10-CM | POA: Diagnosis not present

## 2017-03-25 ENCOUNTER — Encounter: Payer: Self-pay | Admitting: Cardiology

## 2017-04-03 LAB — CUP PACEART REMOTE DEVICE CHECK
Battery Remaining Longevity: 79 mo
Battery Remaining Percentage: 65 %
Battery Voltage: 2.9 V
Brady Statistic AP VP Percent: 1 %
Brady Statistic AP VS Percent: 77 %
Brady Statistic AS VP Percent: 1 %
Brady Statistic AS VS Percent: 23 %
Brady Statistic RA Percent Paced: 61 %
Brady Statistic RV Percent Paced: 5.8 %
Date Time Interrogation Session: 20190214182423
Implantable Lead Implant Date: 20130402
Implantable Lead Implant Date: 20130402
Implantable Lead Location: 753859
Implantable Lead Location: 753860
Implantable Lead Model: 1948
Implantable Pulse Generator Implant Date: 20130402
Lead Channel Impedance Value: 390 Ohm
Lead Channel Impedance Value: 730 Ohm
Lead Channel Pacing Threshold Amplitude: 0.625 V
Lead Channel Pacing Threshold Amplitude: 0.75 V
Lead Channel Pacing Threshold Pulse Width: 0.5 ms
Lead Channel Pacing Threshold Pulse Width: 0.5 ms
Lead Channel Sensing Intrinsic Amplitude: 1.5 mV
Lead Channel Sensing Intrinsic Amplitude: 12 mV
Lead Channel Setting Pacing Amplitude: 1.625
Lead Channel Setting Pacing Amplitude: 2.5 V
Lead Channel Setting Pacing Pulse Width: 0.5 ms
Lead Channel Setting Sensing Sensitivity: 2 mV
Pulse Gen Model: 2110
Pulse Gen Serial Number: 7317017

## 2017-04-21 DIAGNOSIS — I129 Hypertensive chronic kidney disease with stage 1 through stage 4 chronic kidney disease, or unspecified chronic kidney disease: Secondary | ICD-10-CM | POA: Diagnosis not present

## 2017-04-21 DIAGNOSIS — I495 Sick sinus syndrome: Secondary | ICD-10-CM | POA: Diagnosis not present

## 2017-04-21 DIAGNOSIS — Z950 Presence of cardiac pacemaker: Secondary | ICD-10-CM | POA: Diagnosis not present

## 2017-04-21 DIAGNOSIS — N189 Chronic kidney disease, unspecified: Secondary | ICD-10-CM | POA: Diagnosis not present

## 2017-04-21 DIAGNOSIS — I480 Paroxysmal atrial fibrillation: Secondary | ICD-10-CM | POA: Diagnosis not present

## 2017-04-21 DIAGNOSIS — E785 Hyperlipidemia, unspecified: Secondary | ICD-10-CM | POA: Diagnosis not present

## 2017-06-18 ENCOUNTER — Ambulatory Visit (INDEPENDENT_AMBULATORY_CARE_PROVIDER_SITE_OTHER): Payer: Medicare Other | Admitting: *Deleted

## 2017-06-18 ENCOUNTER — Telehealth: Payer: Self-pay | Admitting: Cardiology

## 2017-06-18 DIAGNOSIS — I495 Sick sinus syndrome: Secondary | ICD-10-CM

## 2017-06-18 DIAGNOSIS — I480 Paroxysmal atrial fibrillation: Secondary | ICD-10-CM

## 2017-06-18 NOTE — Progress Notes (Signed)
Remote pacemaker transmission.   

## 2017-06-18 NOTE — Telephone Encounter (Signed)
Spoke with pt and reminded pt of remote transmission that is due today. Pt verbalized understanding.   

## 2017-06-19 ENCOUNTER — Encounter: Payer: Self-pay | Admitting: Cardiology

## 2017-06-30 LAB — CUP PACEART REMOTE DEVICE CHECK
Battery Remaining Longevity: 80 mo
Battery Remaining Percentage: 65 %
Battery Voltage: 2.9 V
Brady Statistic AP VP Percent: 1 %
Brady Statistic AP VS Percent: 77 %
Brady Statistic AS VP Percent: 1 %
Brady Statistic AS VS Percent: 23 %
Brady Statistic RA Percent Paced: 61 %
Brady Statistic RV Percent Paced: 5.7 %
Date Time Interrogation Session: 20190516174917
Implantable Lead Implant Date: 20130402
Implantable Lead Implant Date: 20130402
Implantable Lead Location: 753859
Implantable Lead Location: 753860
Implantable Lead Model: 1948
Implantable Pulse Generator Implant Date: 20130402
Lead Channel Impedance Value: 400 Ohm
Lead Channel Impedance Value: 710 Ohm
Lead Channel Pacing Threshold Amplitude: 0.625 V
Lead Channel Pacing Threshold Amplitude: 0.75 V
Lead Channel Pacing Threshold Pulse Width: 0.5 ms
Lead Channel Pacing Threshold Pulse Width: 0.5 ms
Lead Channel Sensing Intrinsic Amplitude: 1.3 mV
Lead Channel Sensing Intrinsic Amplitude: 12 mV
Lead Channel Setting Pacing Amplitude: 1.625
Lead Channel Setting Pacing Amplitude: 2.5 V
Lead Channel Setting Pacing Pulse Width: 0.5 ms
Lead Channel Setting Sensing Sensitivity: 2 mV
Pulse Gen Model: 2110
Pulse Gen Serial Number: 7317017

## 2017-07-22 DIAGNOSIS — H353122 Nonexudative age-related macular degeneration, left eye, intermediate dry stage: Secondary | ICD-10-CM | POA: Diagnosis not present

## 2017-07-22 DIAGNOSIS — H43813 Vitreous degeneration, bilateral: Secondary | ICD-10-CM | POA: Diagnosis not present

## 2017-07-22 DIAGNOSIS — H353212 Exudative age-related macular degeneration, right eye, with inactive choroidal neovascularization: Secondary | ICD-10-CM | POA: Diagnosis not present

## 2017-08-10 DIAGNOSIS — E785 Hyperlipidemia, unspecified: Secondary | ICD-10-CM | POA: Diagnosis not present

## 2017-08-10 DIAGNOSIS — I480 Paroxysmal atrial fibrillation: Secondary | ICD-10-CM | POA: Diagnosis not present

## 2017-08-10 DIAGNOSIS — N189 Chronic kidney disease, unspecified: Secondary | ICD-10-CM | POA: Diagnosis not present

## 2017-08-10 DIAGNOSIS — I495 Sick sinus syndrome: Secondary | ICD-10-CM | POA: Diagnosis not present

## 2017-08-10 DIAGNOSIS — I10 Essential (primary) hypertension: Secondary | ICD-10-CM | POA: Diagnosis not present

## 2017-08-10 DIAGNOSIS — M1990 Unspecified osteoarthritis, unspecified site: Secondary | ICD-10-CM | POA: Diagnosis not present

## 2017-08-10 DIAGNOSIS — Z950 Presence of cardiac pacemaker: Secondary | ICD-10-CM | POA: Diagnosis not present

## 2017-08-11 ENCOUNTER — Encounter: Payer: Medicare Other | Admitting: Physician Assistant

## 2017-08-12 ENCOUNTER — Encounter: Payer: Medicare Other | Admitting: Physician Assistant

## 2017-09-17 ENCOUNTER — Telehealth: Payer: Self-pay | Admitting: Cardiology

## 2017-09-17 ENCOUNTER — Encounter: Payer: Medicare Other | Admitting: *Deleted

## 2017-09-17 DIAGNOSIS — Z79899 Other long term (current) drug therapy: Secondary | ICD-10-CM | POA: Diagnosis not present

## 2017-09-17 DIAGNOSIS — R011 Cardiac murmur, unspecified: Secondary | ICD-10-CM | POA: Diagnosis not present

## 2017-09-17 DIAGNOSIS — N184 Chronic kidney disease, stage 4 (severe): Secondary | ICD-10-CM | POA: Diagnosis not present

## 2017-09-17 DIAGNOSIS — I129 Hypertensive chronic kidney disease with stage 1 through stage 4 chronic kidney disease, or unspecified chronic kidney disease: Secondary | ICD-10-CM | POA: Diagnosis not present

## 2017-09-17 DIAGNOSIS — E222 Syndrome of inappropriate secretion of antidiuretic hormone: Secondary | ICD-10-CM | POA: Diagnosis not present

## 2017-09-17 NOTE — Telephone Encounter (Signed)
Spoke with pt and reminded pt of remote transmission that is due today. Pt verbalized understanding and stated that she has appt on Monday September 21, 2017 with PA.

## 2017-09-20 NOTE — Progress Notes (Signed)
Cardiology Office Note Date:  9/37/1696  Patient ID:  Julie Mcpherson, Julie Mcpherson 07-30-1913, MRN [id number] PCP:  Lajean Manes, MD  Cardiologist: Dr. Terrence Dupont Electrophysiologist: Dr. Caryl Comes    Chief Complaint: annual EP/device visit  History of Present Illness: Julie Mcpherson is a 84 y.o. female with history of CKD (III), HTN, HLD, GERD, DVT, AFib, Post termination pauses/syncope s/p PPM.  He comes in today to be seen for Dr. Caryl Comes.  He last saw him in May 2018, at that visit, recommended consideration at least for a/c, though deferred ultimately to her primary cardiologist.  She is accompanied by an aid from [facility] where she is a resident in the ALF.  She tells me she has her own apartment, is essentially non-ambulatory due to her terrible knees, and failing sight, she reports excellent help from her son who takes care of putting her medicines in her med boxes and keeps up with her medicines.  She is unaware of what her medicines are specifically.  She forgot her purse in her apartment.  She denies any kind of CP, palpitations or SOB, no dizziness, near syncope or syncope.  She tells me she has labs Q 3 months with her PMD and sees Dr. Terrence Dupont about every 3 months as well.   Device information: SJM dual chamber PPM, implanted 05/06/11   Past Medical History:  Diagnosis Date  . Arthritis   . Atrial fibrillation ([place])   . Blood transfusion   . Chronic kidney disease    stage 3 renal disease  . Diverticular disease   . DJD (degenerative joint disease)   . DVT (deep venous thrombosis) ([place]) early 1980's   left medial upper thigh  . Fracture, shoulder ~ 2003; 1997   left; right  . GERD (gastroesophageal reflux disease)   . High cholesterol   . Hypertension   . Overactive bladder   . Pacemaker   . Skin cancer of face   . Syncope secondary to post termination pauses 05/03/11   "passed out; first time ever"  . Urinary frequency     Past Surgical History:  Procedure  Laterality Date  . APPENDECTOMY  ~ 1938  . BLADDER SUSPENSION  1985  . CARPAL TUNNEL RELEASE  04/18/2011   right; Procedure: CARPAL TUNNEL RELEASE;  Surgeon: Cammie Sickle., MD;  Location: [place];  Service: Orthopedics;  Laterality: Right;  . CARPAL TUNNEL RELEASE  12/05/2011   Procedure: CARPAL TUNNEL RELEASE;  Surgeon: Cammie Sickle., MD;  Location: [place];  Service: Orthopedics;  Laterality: Left;  . CATARACT EXTRACTION W/ INTRAOCULAR LENS  IMPLANT, BILATERAL    . HERNIA REPAIR  1986   "stomach"  . JOINT REPLACEMENT     hips  . PERMANENT PACEMAKER INSERTION N/A 05/06/2011   Procedure: PERMANENT PACEMAKER INSERTION;  Surgeon: Deboraha Sprang, MD;  Location: [facility] CATH LAB;  Service: Cardiovascular;  Laterality: N/A;  . SKIN CANCER EXCISION  2012   "twice"  . TEMPORARY PACEMAKER INSERTION N/A 05/05/2011   Procedure: TEMPORARY PACEMAKER INSERTION;  Surgeon: Clent Demark, MD;  Location: [place] CATH LAB;  Service: Cardiovascular;  Laterality: N/A;  . TOTAL HIP ARTHROPLASTY     "2 on right; 1 on left"    Current Outpatient Medications  Medication Sig Dispense Refill  . acetaminophen (TYLENOL) 500 MG tablet Take 500 mg by mouth 2 (two) times daily.    [name] amLODipine (NORVASC) 5 MG tablet Take 5 mg by mouth  daily.    . aspirin 81 MG tablet Take 81 mg by mouth daily.    . calcium carbonate (OS-CAL) 600 MG TABS Take 600 mg by mouth daily with breakfast.     . cholecalciferol (VITAMIN D) 1000 UNITS tablet Take 1,000 Units by mouth daily.    [name] FERROUS SULFATE PO Take 1 tablet by mouth 2 (two) times a week.    . fish oil-omega-3 fatty acids 1000 MG capsule Take 1 g by mouth daily.    [name] gabapentin (NEURONTIN) 100 MG capsule Take 1 capsule (100 mg total) by mouth 3 (three) times daily. 30 capsule 0  . HYDROcodone-acetaminophen (NORCO/VICODIN) 5-325 MG tablet Take 1 tablet by mouth every 4 (four) hours as needed. 10 tablet 0  . metoprolol succinate (TOPROL-XL)  25 MG 24 hr tablet Take 25 mg by mouth daily.    . Multiple Vitamin (MULTIVITAMIN WITH MINERALS) TABS Take 1 tablet by mouth daily.    [name] MYRBETRIQ 50 MG TB24 tablet Take 1 tablet by mouth daily.    [name] omeprazole (PRILOSEC) 40 MG capsule Take 40 mg by mouth daily.    [name] spironolactone (ALDACTONE) 25 MG tablet Take 12.5 mg by mouth daily.     No current facility-administered medications for this visit.     Allergies:   Codeine; Statins; Sulfa antibiotics; and Tetanus toxoids   Social History:  The patient  reports that she has never smoked. She has never used smokeless tobacco. She reports that she drinks alcohol. She reports that she does not use drugs.   Family History:  The patient's Family history is unknown by patient.  ROS:  Please see the history of present illness.  All other systems are reviewed and otherwise negative.   PHYSICAL EXAM:  VS:  BP 126/68   Pulse 78   Ht 5\' 4"  (1.626 m)   Wt 141 lb (64 kg)   BMI 24.20 kg/m  BMI: Body mass index is 24.2 kg/m. Well nourished, well developed, in no acute distress  HEENT: normocephalic, atraumatic  Neck: no JVD, carotid bruits or masses Cardiac:  iRRR; soft SM, no rubs, or gallops Lungs:  CTA b/l, no wheezing, rhonchi or rales  Abd: soft, nontender MS: no deformity, age appropriate atrophy Ext: trace-1+ ankle edema RLE, LLE more so (chronically per the patient after her 2nd hip surgery) Skin: warm and dry, no rash Neuro:  No gross deficits appreciated Psych: euthymic mood, full affect  PPM site is stable, no tethering or discomfort   EKG:  Not done today PPM interrogation done today and reviewed by myself: battery and lead measurements are good, she is in rate controlled Afib today, 3rd day of this episode, appears she is in/out of AFib throughout the year with similar durations, 1-6 days at a time.  05/06/11: TTE Study Conclusions - Left ventricle: The cavity size was normal. Systolic function was normal. The estimated  ejection fraction was in the range of 55% to 60%. Wall motion was normal; there were no regional wall motion abnormalities. - Mitral valve: Calcified annulus. The findings are consistent with mild stenosis. - Pericardium, extracardiac: A trivial pericardial effusion was identified.   Recent Labs: No results found for requested labs within last 8760 hours.  No results found for requested labs within last 8760 hours.   CrCl cannot be calculated (Patient's most recent lab result is older than the maximum 21 days allowed.).   Wt Readings from Last 3 Encounters:  09/21/17 141 lb (64 kg)  06/18/16 144 lb (65.3 kg)  06/13/15 145 lb 3.2 oz (65.9 kg)     Other studies reviewed: Additional studies/records reviewed today include: summarized above  ASSESSMENT AND PLAN:  1. Paroxysmal AFib     CHA2DS2Vasc is 3, not on a/c,      this has been recommended though deferred ultimately  To her primary cardiologist      2. PPM     intact function  3. HTN     Looks good, no changes  We were unable to confirm her medicine list today as accurate, though the patient doesn't think there have been any changes.  We have reached out to her son to make sure we have an updated list.   Disposition: F/u with Q 3 month remotes, in-clinic EP in one year, sooner if needed.  She sees Dr. Terrence Dupont and he PMD every 3 months  Current medicines are reviewed at length with the patient today.  The patient did not have any concerns regarding medicines.  Venetia Night, PA-C 09/21/2017 2:36 PM     [address] [place] [address] [phone] (office)  [phone] (fax)

## 2017-09-21 ENCOUNTER — Telehealth: Payer: Self-pay | Admitting: Physician Assistant

## 2017-09-21 ENCOUNTER — Ambulatory Visit (INDEPENDENT_AMBULATORY_CARE_PROVIDER_SITE_OTHER): Payer: Medicare Other | Admitting: Physician Assistant

## 2017-09-21 VITALS — BP 126/68 | HR 78 | Ht 64.0 in | Wt 141.0 lb

## 2017-09-21 DIAGNOSIS — I10 Essential (primary) hypertension: Secondary | ICD-10-CM

## 2017-09-21 DIAGNOSIS — I480 Paroxysmal atrial fibrillation: Secondary | ICD-10-CM | POA: Diagnosis not present

## 2017-09-21 DIAGNOSIS — Z950 Presence of cardiac pacemaker: Secondary | ICD-10-CM

## 2017-09-21 NOTE — Telephone Encounter (Signed)
New Message ° ° ° ° ° ° ° ° ° °Patient returned your call °

## 2017-09-21 NOTE — Patient Instructions (Addendum)
Medication Instructions:   Your physician recommends that you continue on your current medications as directed. Please refer to the Current Medication list given to you today.    If you need a refill on your cardiac medications before your next appointment, please call your pharmacy.  Labwork: NONE ORDERED  TODAY    Testing/Procedures: NONE ORDERED  TODAY    Follow-Up: Your physician wants you to follow-up in: [place] will receive a reminder letter in the mail two months in advance. If you don't receive a letter, please call our office to schedule the follow-up appointment.  Remote monitoring is used to monitor your Pacemaker of ICD from home. This monitoring reduces the number of office visits required to check your device to one time per year. It allows [place] to keep an eye on the functioning of your device to ensure it is working properly. You are scheduled for a device check from home on .12-21-17  You may send your transmission at any time that day. If you have a wireless device, the transmission will be sent automatically. After your physician reviews your transmission, you will receive a postcard with your next transmission date.     Any Other Special Instructions Will Be Listed Below (If Applicable).                                                                                                                                                  T

## 2017-11-10 DIAGNOSIS — M1712 Unilateral primary osteoarthritis, left knee: Secondary | ICD-10-CM | POA: Diagnosis not present

## 2017-11-10 DIAGNOSIS — M1711 Unilateral primary osteoarthritis, right knee: Secondary | ICD-10-CM | POA: Diagnosis not present

## 2017-12-01 DIAGNOSIS — Z23 Encounter for immunization: Secondary | ICD-10-CM | POA: Diagnosis not present

## 2017-12-09 DIAGNOSIS — I480 Paroxysmal atrial fibrillation: Secondary | ICD-10-CM | POA: Diagnosis not present

## 2017-12-09 DIAGNOSIS — M1990 Unspecified osteoarthritis, unspecified site: Secondary | ICD-10-CM | POA: Diagnosis not present

## 2017-12-09 DIAGNOSIS — I10 Essential (primary) hypertension: Secondary | ICD-10-CM | POA: Diagnosis not present

## 2017-12-09 DIAGNOSIS — E785 Hyperlipidemia, unspecified: Secondary | ICD-10-CM | POA: Diagnosis not present

## 2017-12-09 DIAGNOSIS — Z950 Presence of cardiac pacemaker: Secondary | ICD-10-CM | POA: Diagnosis not present

## 2017-12-09 DIAGNOSIS — N189 Chronic kidney disease, unspecified: Secondary | ICD-10-CM | POA: Diagnosis not present

## 2017-12-21 ENCOUNTER — Ambulatory Visit (INDEPENDENT_AMBULATORY_CARE_PROVIDER_SITE_OTHER): Payer: Medicare Other

## 2017-12-21 ENCOUNTER — Telehealth: Payer: Self-pay

## 2017-12-21 DIAGNOSIS — I495 Sick sinus syndrome: Secondary | ICD-10-CM | POA: Diagnosis not present

## 2017-12-21 NOTE — Telephone Encounter (Signed)
Spoke with pt and reminded pt of remote transmission that is due today. Pt verbalized understanding.   

## 2017-12-23 NOTE — Progress Notes (Signed)
Remote pacemaker transmission.   

## 2017-12-24 ENCOUNTER — Encounter: Payer: Self-pay | Admitting: Cardiology

## 2018-01-19 DIAGNOSIS — H353212 Exudative age-related macular degeneration, right eye, with inactive choroidal neovascularization: Secondary | ICD-10-CM | POA: Diagnosis not present

## 2018-01-19 DIAGNOSIS — H43813 Vitreous degeneration, bilateral: Secondary | ICD-10-CM | POA: Diagnosis not present

## 2018-01-19 DIAGNOSIS — H353121 Nonexudative age-related macular degeneration, left eye, early dry stage: Secondary | ICD-10-CM | POA: Diagnosis not present

## 2018-02-01 DIAGNOSIS — H1032 Unspecified acute conjunctivitis, left eye: Secondary | ICD-10-CM | POA: Diagnosis not present

## 2018-02-01 DIAGNOSIS — J40 Bronchitis, not specified as acute or chronic: Secondary | ICD-10-CM | POA: Diagnosis not present

## 2018-02-16 LAB — CUP PACEART REMOTE DEVICE CHECK
Battery Remaining Longevity: 71 mo
Battery Remaining Percentage: 57 %
Battery Voltage: 2.89 V
Brady Statistic AP VP Percent: 1 %
Brady Statistic AP VS Percent: 69 %
Brady Statistic AS VP Percent: 1 %
Brady Statistic AS VS Percent: 31 %
Brady Statistic RA Percent Paced: 50 %
Brady Statistic RV Percent Paced: 10 %
Date Time Interrogation Session: 20191118180052
Implantable Lead Implant Date: 20130402
Implantable Lead Implant Date: 20130402
Implantable Lead Location: 753859
Implantable Lead Location: 753860
Implantable Lead Model: 1948
Implantable Pulse Generator Implant Date: 20130402
Lead Channel Impedance Value: 400 Ohm
Lead Channel Impedance Value: 740 Ohm
Lead Channel Pacing Threshold Amplitude: 0.625 V
Lead Channel Pacing Threshold Amplitude: 0.75 V
Lead Channel Pacing Threshold Pulse Width: 0.5 ms
Lead Channel Pacing Threshold Pulse Width: 0.5 ms
Lead Channel Sensing Intrinsic Amplitude: 1.6 mV
Lead Channel Sensing Intrinsic Amplitude: 12 mV
Lead Channel Setting Pacing Amplitude: 1.625
Lead Channel Setting Pacing Amplitude: 2.5 V
Lead Channel Setting Pacing Pulse Width: 0.5 ms
Lead Channel Setting Sensing Sensitivity: 2 mV
Pulse Gen Model: 2110
Pulse Gen Serial Number: 7317017

## 2018-02-17 DIAGNOSIS — E222 Syndrome of inappropriate secretion of antidiuretic hormone: Secondary | ICD-10-CM | POA: Diagnosis not present

## 2018-02-17 DIAGNOSIS — I700 Atherosclerosis of aorta: Secondary | ICD-10-CM | POA: Diagnosis not present

## 2018-02-17 DIAGNOSIS — G2581 Restless legs syndrome: Secondary | ICD-10-CM | POA: Diagnosis not present

## 2018-02-17 DIAGNOSIS — R0989 Other specified symptoms and signs involving the circulatory and respiratory systems: Secondary | ICD-10-CM | POA: Diagnosis not present

## 2018-02-17 DIAGNOSIS — I480 Paroxysmal atrial fibrillation: Secondary | ICD-10-CM | POA: Diagnosis not present

## 2018-02-17 DIAGNOSIS — I129 Hypertensive chronic kidney disease with stage 1 through stage 4 chronic kidney disease, or unspecified chronic kidney disease: Secondary | ICD-10-CM | POA: Diagnosis not present

## 2018-02-17 DIAGNOSIS — E7800 Pure hypercholesterolemia, unspecified: Secondary | ICD-10-CM | POA: Diagnosis not present

## 2018-02-17 DIAGNOSIS — N184 Chronic kidney disease, stage 4 (severe): Secondary | ICD-10-CM | POA: Diagnosis not present

## 2018-02-17 DIAGNOSIS — E611 Iron deficiency: Secondary | ICD-10-CM | POA: Diagnosis not present

## 2018-03-24 ENCOUNTER — Telehealth: Payer: Self-pay

## 2018-03-24 NOTE — Telephone Encounter (Signed)
Left message for patient to remind of missed remote transmission.  

## 2018-04-02 ENCOUNTER — Encounter: Payer: Self-pay | Admitting: Cardiology

## 2018-04-12 ENCOUNTER — Ambulatory Visit (INDEPENDENT_AMBULATORY_CARE_PROVIDER_SITE_OTHER): Payer: Medicare Other | Admitting: *Deleted

## 2018-04-12 ENCOUNTER — Telehealth: Payer: Self-pay

## 2018-04-12 DIAGNOSIS — I480 Paroxysmal atrial fibrillation: Secondary | ICD-10-CM

## 2018-04-12 DIAGNOSIS — I495 Sick sinus syndrome: Secondary | ICD-10-CM | POA: Diagnosis not present

## 2018-04-12 NOTE — Telephone Encounter (Signed)
Pt son tried to send a transmission with her home monitor with my help but was unsuccessful. I gave him the number to [facility] to get further help.

## 2018-04-13 LAB — CUP PACEART REMOTE DEVICE CHECK
Battery Remaining Longevity: 69 mo
Battery Remaining Percentage: 57 %
Battery Voltage: 2.89 V
Brady Statistic AP VP Percent: 1 %
Brady Statistic AP VS Percent: 73 %
Brady Statistic AS VP Percent: 1 %
Brady Statistic AS VS Percent: 27 %
Brady Statistic RA Percent Paced: 56 %
Brady Statistic RV Percent Paced: 7.7 %
Date Time Interrogation Session: 20200309153935
Implantable Lead Implant Date: 20130402
Implantable Lead Implant Date: 20130402
Implantable Lead Location: 753859
Implantable Lead Location: 753860
Implantable Lead Model: 1948
Implantable Pulse Generator Implant Date: 20130402
Lead Channel Impedance Value: 360 Ohm
Lead Channel Impedance Value: 650 Ohm
Lead Channel Pacing Threshold Amplitude: 0.625 V
Lead Channel Pacing Threshold Amplitude: 0.75 V
Lead Channel Pacing Threshold Pulse Width: 0.5 ms
Lead Channel Pacing Threshold Pulse Width: 0.5 ms
Lead Channel Sensing Intrinsic Amplitude: 0.8 mV
Lead Channel Sensing Intrinsic Amplitude: 12 mV
Lead Channel Setting Pacing Amplitude: 1.625
Lead Channel Setting Pacing Amplitude: 2.5 V
Lead Channel Setting Pacing Pulse Width: 0.5 ms
Lead Channel Setting Sensing Sensitivity: 2 mV
Pulse Gen Model: 2110
Pulse Gen Serial Number: 7317017

## 2018-04-20 NOTE — Progress Notes (Signed)
Remote pacemaker transmission.   

## 2018-05-13 DIAGNOSIS — I700 Atherosclerosis of aorta: Secondary | ICD-10-CM | POA: Diagnosis not present

## 2018-05-13 DIAGNOSIS — E222 Syndrome of inappropriate secretion of antidiuretic hormone: Secondary | ICD-10-CM | POA: Diagnosis not present

## 2018-05-13 DIAGNOSIS — Z1389 Encounter for screening for other disorder: Secondary | ICD-10-CM | POA: Diagnosis not present

## 2018-05-13 DIAGNOSIS — K219 Gastro-esophageal reflux disease without esophagitis: Secondary | ICD-10-CM | POA: Diagnosis not present

## 2018-05-13 DIAGNOSIS — I480 Paroxysmal atrial fibrillation: Secondary | ICD-10-CM | POA: Diagnosis not present

## 2018-05-13 DIAGNOSIS — Z0000 Encounter for general adult medical examination without abnormal findings: Secondary | ICD-10-CM | POA: Diagnosis not present

## 2018-05-13 DIAGNOSIS — I129 Hypertensive chronic kidney disease with stage 1 through stage 4 chronic kidney disease, or unspecified chronic kidney disease: Secondary | ICD-10-CM | POA: Diagnosis not present

## 2018-05-13 DIAGNOSIS — E7800 Pure hypercholesterolemia, unspecified: Secondary | ICD-10-CM | POA: Diagnosis not present

## 2018-05-13 DIAGNOSIS — N184 Chronic kidney disease, stage 4 (severe): Secondary | ICD-10-CM | POA: Diagnosis not present

## 2018-07-12 ENCOUNTER — Encounter: Payer: Medicare Other | Admitting: *Deleted

## 2018-07-13 ENCOUNTER — Telehealth: Payer: Self-pay

## 2018-07-13 NOTE — Telephone Encounter (Signed)
Left message for patient to remind of missed remote transmission.  

## 2018-08-05 DIAGNOSIS — H353124 Nonexudative age-related macular degeneration, left eye, advanced atrophic with subfoveal involvement: Secondary | ICD-10-CM | POA: Diagnosis not present

## 2018-08-05 DIAGNOSIS — H53413 Scotoma involving central area, bilateral: Secondary | ICD-10-CM | POA: Diagnosis not present

## 2018-08-05 DIAGNOSIS — H35363 Drusen (degenerative) of macula, bilateral: Secondary | ICD-10-CM | POA: Diagnosis not present

## 2018-08-05 DIAGNOSIS — H35453 Secondary pigmentary degeneration, bilateral: Secondary | ICD-10-CM | POA: Diagnosis not present

## 2018-08-05 DIAGNOSIS — H353213 Exudative age-related macular degeneration, right eye, with inactive scar: Secondary | ICD-10-CM | POA: Diagnosis not present

## 2018-08-05 DIAGNOSIS — Z961 Presence of intraocular lens: Secondary | ICD-10-CM | POA: Diagnosis not present

## 2018-08-19 DIAGNOSIS — E222 Syndrome of inappropriate secretion of antidiuretic hormone: Secondary | ICD-10-CM | POA: Diagnosis not present

## 2018-08-19 DIAGNOSIS — I129 Hypertensive chronic kidney disease with stage 1 through stage 4 chronic kidney disease, or unspecified chronic kidney disease: Secondary | ICD-10-CM | POA: Diagnosis not present

## 2018-08-24 DIAGNOSIS — D649 Anemia, unspecified: Secondary | ICD-10-CM | POA: Diagnosis not present

## 2018-08-24 DIAGNOSIS — Z79899 Other long term (current) drug therapy: Secondary | ICD-10-CM | POA: Diagnosis not present

## 2018-09-16 DIAGNOSIS — D649 Anemia, unspecified: Secondary | ICD-10-CM | POA: Diagnosis not present

## 2018-09-16 DIAGNOSIS — Z79899 Other long term (current) drug therapy: Secondary | ICD-10-CM | POA: Diagnosis not present

## 2018-11-03 ENCOUNTER — Other Ambulatory Visit: Payer: Self-pay

## 2018-11-03 ENCOUNTER — Encounter: Payer: Self-pay | Admitting: Internal Medicine

## 2018-11-03 ENCOUNTER — Ambulatory Visit (INDEPENDENT_AMBULATORY_CARE_PROVIDER_SITE_OTHER): Payer: Medicare Other | Admitting: Internal Medicine

## 2018-11-03 ENCOUNTER — Encounter (INDEPENDENT_AMBULATORY_CARE_PROVIDER_SITE_OTHER): Payer: Self-pay

## 2018-11-03 VITALS — BP 128/60 | HR 61 | Ht 64.0 in | Wt 141.0 lb

## 2018-11-03 DIAGNOSIS — Z79899 Other long term (current) drug therapy: Secondary | ICD-10-CM

## 2018-11-03 DIAGNOSIS — I480 Paroxysmal atrial fibrillation: Secondary | ICD-10-CM

## 2018-11-03 DIAGNOSIS — I495 Sick sinus syndrome: Secondary | ICD-10-CM

## 2018-11-03 DIAGNOSIS — Z950 Presence of cardiac pacemaker: Secondary | ICD-10-CM | POA: Diagnosis not present

## 2018-11-03 DIAGNOSIS — I10 Essential (primary) hypertension: Secondary | ICD-10-CM

## 2018-11-03 LAB — BASIC METABOLIC PANEL
BUN/Creatinine Ratio: 20 (ref 12–28)
BUN: 27 mg/dL (ref 10–36)
CO2: 25 mmol/L (ref 20–29)
Calcium: 9.9 mg/dL (ref 8.7–10.3)
Chloride: 100 mmol/L (ref 96–106)
Creatinine, Ser: 1.38 mg/dL — ABNORMAL HIGH (ref 0.57–1.00)
GFR calc Af Amer: 36 mL/min/{1.73_m2} — ABNORMAL LOW (ref >59)
GFR calc non Af Amer: 31 mL/min/{1.73_m2} — ABNORMAL LOW (ref >59)
Glucose: 91 mg/dL (ref 65–99)
Potassium: 5 mmol/L (ref 3.5–5.2)
Sodium: 138 mmol/L (ref 134–144)

## 2018-11-03 NOTE — Patient Instructions (Signed)
Medication Instructions:  Your physician recommends that you continue on your current medications as directed. Please refer to the Current Medication list given to you today.  *If you need a refill on your cardiac medications before your next appointment, please call your pharmacy*  Labwork: Today: BMET If you have labs (blood work) drawn today and your tests are completely normal, you will receive your results only by:  [place] (if you have MyChart) OR  A paper copy in the mail If you have any lab test that is abnormal or we need to change your treatment, we will call you to review the results.  Testing/Procedures: None ordered  Follow-Up: Remote monitoring is used to monitor your Pacemaker or ICD from home. This monitoring reduces the number of office visits required to check your device to one time per year. It allows [place] to keep an eye on the functioning of your device to ensure it is working properly. You are scheduled for a device check from home on 02/02/19. You may send your transmission at any time that day. If you have a wireless device, the transmission will be sent automatically. After your physician reviews your transmission, you will receive a postcard with your next transmission date.  Your physician wants you to follow-up in: 1 year with Dr. Caryl Comes.  You will receive a reminder letter in the mail two months in advance. If you don't receive a letter, please call our office to schedule the follow-up appointment.  Thank you for choosing [facility]!!     Any Other Special Instructions Will Be Listed Below (If Applicable).

## 2018-11-03 NOTE — Progress Notes (Signed)
Patient Care Team: Lajean Manes, MD as PCP - General (Internal Medicine)   HPI  Julie Mcpherson is a 85 y.o. female Seen following pacemaker implantation April 2013 for atrial fibrillation with post termination pauses.   She is able to do her stuff by herself in her apartment No edema chest pain   She tells me her son with melanoma doing well and moved back to [facility]  Past Medical History:  Diagnosis Date  . Arthritis   . Atrial fibrillation ([place])   . Blood transfusion   . Chronic kidney disease    stage 3 renal disease  . Diverticular disease   . DJD (degenerative joint disease)   . DVT (deep venous thrombosis) ([place]) early 1980's   left medial upper thigh  . Fracture, shoulder ~ 2003; 1997   left; right  . GERD (gastroesophageal reflux disease)   . High cholesterol   . Hypertension   . Overactive bladder   . Pacemaker   . Skin cancer of face   . Syncope secondary to post termination pauses 05/03/11   "passed out; first time ever"  . Urinary frequency     Past Surgical History:  Procedure Laterality Date  . APPENDECTOMY  ~ 1938  . BLADDER SUSPENSION  1985  . CARPAL TUNNEL RELEASE  04/18/2011   right; Procedure: CARPAL TUNNEL RELEASE;  Surgeon: Cammie Sickle., MD;  Location: [place];  Service: Orthopedics;  Laterality: Right;  . CARPAL TUNNEL RELEASE  12/05/2011   Procedure: CARPAL TUNNEL RELEASE;  Surgeon: Cammie Sickle., MD;  Location: [place];  Service: Orthopedics;  Laterality: Left;  . CATARACT EXTRACTION W/ INTRAOCULAR LENS  IMPLANT, BILATERAL    . HERNIA REPAIR  1986   "stomach"  . JOINT REPLACEMENT     hips  . PERMANENT PACEMAKER INSERTION N/A 05/06/2011   Procedure: PERMANENT PACEMAKER INSERTION;  Surgeon: Deboraha Sprang, MD;  Location: [facility] CATH LAB;  Service: Cardiovascular;  Laterality: N/A;  . SKIN CANCER EXCISION  2012   "twice"  . TEMPORARY PACEMAKER INSERTION N/A 05/05/2011   Procedure:  TEMPORARY PACEMAKER INSERTION;  Surgeon: Clent Demark, MD;  Location: [place] CATH LAB;  Service: Cardiovascular;  Laterality: N/A;  . TOTAL HIP ARTHROPLASTY     "2 on right; 1 on left"    Current Outpatient Medications  Medication Sig Dispense Refill  . acetaminophen (TYLENOL) 500 MG tablet Take 500 mg by mouth 2 (two) times daily.    [name] amLODipine (NORVASC) 5 MG tablet Take 5 mg by mouth daily.    [name] aspirin 81 MG tablet Take 81 mg by mouth daily.    . calcium carbonate (OS-CAL) 600 MG TABS Take 600 mg by mouth daily with breakfast.     . cholecalciferol (VITAMIN D) 1000 UNITS tablet Take 1,000 Units by mouth daily.    [name] FERROUS SULFATE PO Take 1 tablet by mouth 2 (two) times a week.    . fish oil-omega-3 fatty acids 1000 MG capsule Take 1 g by mouth daily.    [name] gabapentin (NEURONTIN) 100 MG capsule Take 1 capsule (100 mg total) by mouth 3 (three) times daily. 30 capsule 0  . HYDROcodone-acetaminophen (NORCO/VICODIN) 5-325 MG tablet Take 1 tablet by mouth every 4 (four) hours as needed. 10 tablet 0  . metoprolol succinate (TOPROL-XL) 25 MG 24 hr tablet Take 25 mg by mouth daily.    . Multiple Vitamin (MULTIVITAMIN WITH MINERALS) TABS Take  1 tablet by mouth daily.    [name] MYRBETRIQ 50 MG TB24 tablet Take 1 tablet by mouth daily.    [name] omeprazole (PRILOSEC) 40 MG capsule Take 40 mg by mouth daily.    [name] spironolactone (ALDACTONE) 25 MG tablet Take 12.5 mg by mouth daily.     No current facility-administered medications for this visit.     Allergies  Allergen Reactions  . Codeine Other (See Comments)    "can't sleep; I climb the walls"  . Statins Other (See Comments)    "ocular migraine headaches"  . Sulfa Antibiotics Anaphylaxis, Itching and Rash  . Tetanus Toxoids Other (See Comments)    "think it started turning red around where I got shot; they gave me small doses and I did fine"    Review of Systems negative except from HPI and PMH  Physical Exam BP 128/60   Pulse 61   Ht 5\' 4"   (1.626 m)   Wt 141 lb (64 kg)   SpO2 95%   BMI 24.20 kg/m  Well developed and well nourished in no acute distress HENT normal Neck supple with JVP-flat Clear Device pocket well healed; without hematoma or erythema.  There is no tethering  Regular rate and rhythm, no  gallop No murmur Abd-soft with active BS No Clubbing cyanosis   edema Skin-warm and dry A & Oriented  Grossly normal sensory and motor function  ECG A pacing with intrinsic conduction 24/15/42    Assessment and  Plan  Atrial fibrillation with posttermination pauses  Pacemaker-St. Jude The patient's device was interrogated.  The information was reviewed. No changes were made in the programming.     Hypertension  Right bundle branch block   High Risk Medication Surveillance aldactone   One prolonged episode of atrial fib > 24 hrs but less than 30 Given age will hold off on anticoagulation at this point No syncope

## 2018-11-09 DIAGNOSIS — L03116 Cellulitis of left lower limb: Secondary | ICD-10-CM | POA: Diagnosis not present

## 2018-11-09 DIAGNOSIS — N184 Chronic kidney disease, stage 4 (severe): Secondary | ICD-10-CM | POA: Diagnosis not present

## 2018-11-09 DIAGNOSIS — I129 Hypertensive chronic kidney disease with stage 1 through stage 4 chronic kidney disease, or unspecified chronic kidney disease: Secondary | ICD-10-CM | POA: Diagnosis not present

## 2018-11-09 DIAGNOSIS — S81802A Unspecified open wound, left lower leg, initial encounter: Secondary | ICD-10-CM | POA: Diagnosis not present

## 2018-11-15 LAB — CUP PACEART INCLINIC DEVICE CHECK
Battery Remaining Longevity: 62 mo
Battery Voltage: 2.87 V
Brady Statistic RA Percent Paced: 73 %
Brady Statistic RV Percent Paced: 4.1 %
Date Time Interrogation Session: 20200930133111
Implantable Lead Implant Date: 20130402
Implantable Lead Implant Date: 20130402
Implantable Lead Location: 753859
Implantable Lead Location: 753860
Implantable Lead Model: 1948
Implantable Pulse Generator Implant Date: 20130402
Lead Channel Impedance Value: 387.5 Ohm
Lead Channel Impedance Value: 762.5 Ohm
Lead Channel Pacing Threshold Amplitude: 0.75 V
Lead Channel Pacing Threshold Amplitude: 0.75 V
Lead Channel Pacing Threshold Pulse Width: 0.5 ms
Lead Channel Pacing Threshold Pulse Width: 0.5 ms
Lead Channel Sensing Intrinsic Amplitude: 1.2 mV
Lead Channel Sensing Intrinsic Amplitude: 12 mV
Lead Channel Setting Pacing Amplitude: 1.75 V
Lead Channel Setting Pacing Amplitude: 2.5 V
Lead Channel Setting Pacing Pulse Width: 0.5 ms
Lead Channel Setting Sensing Sensitivity: 2 mV
Pulse Gen Model: 2110
Pulse Gen Serial Number: 7317017

## 2019-01-12 DIAGNOSIS — H01005 Unspecified blepharitis left lower eyelid: Secondary | ICD-10-CM | POA: Diagnosis not present

## 2019-01-12 DIAGNOSIS — H01002 Unspecified blepharitis right lower eyelid: Secondary | ICD-10-CM | POA: Diagnosis not present

## 2019-02-02 ENCOUNTER — Ambulatory Visit (INDEPENDENT_AMBULATORY_CARE_PROVIDER_SITE_OTHER): Payer: Medicare Other | Admitting: *Deleted

## 2019-02-02 DIAGNOSIS — I495 Sick sinus syndrome: Secondary | ICD-10-CM | POA: Diagnosis not present

## 2019-02-03 LAB — CUP PACEART REMOTE DEVICE CHECK
Battery Remaining Longevity: 57 mo
Battery Remaining Percentage: 46 %
Battery Voltage: 2.86 V
Brady Statistic AP VP Percent: 1 %
Brady Statistic AP VS Percent: 92 %
Brady Statistic AS VP Percent: 1 %
Brady Statistic AS VS Percent: 8 %
Brady Statistic RA Percent Paced: 92 %
Brady Statistic RV Percent Paced: 1 %
Date Time Interrogation Session: 20201231102025
Implantable Lead Implant Date: 20130402
Implantable Lead Implant Date: 20130402
Implantable Lead Location: 753859
Implantable Lead Location: 753860
Implantable Lead Model: 1948
Implantable Pulse Generator Implant Date: 20130402
Lead Channel Impedance Value: 360 Ohm
Lead Channel Impedance Value: 660 Ohm
Lead Channel Pacing Threshold Amplitude: 0.625 V
Lead Channel Pacing Threshold Amplitude: 0.75 V
Lead Channel Pacing Threshold Pulse Width: 0.5 ms
Lead Channel Pacing Threshold Pulse Width: 0.5 ms
Lead Channel Sensing Intrinsic Amplitude: 0.4 mV
Lead Channel Sensing Intrinsic Amplitude: 12 mV
Lead Channel Setting Pacing Amplitude: 1.625
Lead Channel Setting Pacing Amplitude: 2.5 V
Lead Channel Setting Pacing Pulse Width: 0.5 ms
Lead Channel Setting Sensing Sensitivity: 2 mV
Pulse Gen Model: 2110
Pulse Gen Serial Number: 7317017

## 2019-05-04 ENCOUNTER — Telehealth: Payer: Self-pay

## 2019-05-04 ENCOUNTER — Ambulatory Visit (INDEPENDENT_AMBULATORY_CARE_PROVIDER_SITE_OTHER): Payer: Medicare Other | Admitting: *Deleted

## 2019-05-04 DIAGNOSIS — I495 Sick sinus syndrome: Secondary | ICD-10-CM

## 2019-05-04 LAB — CUP PACEART REMOTE DEVICE CHECK
Battery Remaining Longevity: 50 mo
Battery Remaining Percentage: 40 %
Battery Voltage: 2.84 V
Brady Statistic AP VP Percent: 1 %
Brady Statistic AP VS Percent: 90 %
Brady Statistic AS VP Percent: 1 %
Brady Statistic AS VS Percent: 9.6 %
Brady Statistic RA Percent Paced: 90 %
Brady Statistic RV Percent Paced: 1 %
Date Time Interrogation Session: 20210331104209
Implantable Lead Implant Date: 20130402
Implantable Lead Implant Date: 20130402
Implantable Lead Location: 753859
Implantable Lead Location: 753860
Implantable Lead Model: 1948
Implantable Pulse Generator Implant Date: 20130402
Lead Channel Impedance Value: 360 Ohm
Lead Channel Impedance Value: 810 Ohm
Lead Channel Pacing Threshold Amplitude: 0.625 V
Lead Channel Pacing Threshold Amplitude: 0.75 V
Lead Channel Pacing Threshold Pulse Width: 0.5 ms
Lead Channel Pacing Threshold Pulse Width: 0.5 ms
Lead Channel Sensing Intrinsic Amplitude: 0.9 mV
Lead Channel Sensing Intrinsic Amplitude: 12 mV
Lead Channel Setting Pacing Amplitude: 1.625
Lead Channel Setting Pacing Amplitude: 2.5 V
Lead Channel Setting Pacing Pulse Width: 0.5 ms
Lead Channel Setting Sensing Sensitivity: 2 mV
Pulse Gen Model: 2110
Pulse Gen Serial Number: 7317017

## 2019-05-04 NOTE — Progress Notes (Signed)
PPM Remote  

## 2019-05-04 NOTE — Telephone Encounter (Signed)
The pt needed help sending a transmission. Transmission received

## 2019-09-28 DIAGNOSIS — E7800 Pure hypercholesterolemia, unspecified: Secondary | ICD-10-CM | POA: Diagnosis not present

## 2019-09-28 DIAGNOSIS — Z0000 Encounter for general adult medical examination without abnormal findings: Secondary | ICD-10-CM | POA: Diagnosis not present

## 2019-09-28 DIAGNOSIS — I700 Atherosclerosis of aorta: Secondary | ICD-10-CM | POA: Diagnosis not present

## 2019-09-28 DIAGNOSIS — I129 Hypertensive chronic kidney disease with stage 1 through stage 4 chronic kidney disease, or unspecified chronic kidney disease: Secondary | ICD-10-CM | POA: Diagnosis not present

## 2019-09-28 DIAGNOSIS — N1832 Chronic kidney disease, stage 3b: Secondary | ICD-10-CM | POA: Diagnosis not present

## 2019-09-28 DIAGNOSIS — E222 Syndrome of inappropriate secretion of antidiuretic hormone: Secondary | ICD-10-CM | POA: Diagnosis not present

## 2019-09-28 DIAGNOSIS — Z1389 Encounter for screening for other disorder: Secondary | ICD-10-CM | POA: Diagnosis not present

## 2019-09-28 DIAGNOSIS — I480 Paroxysmal atrial fibrillation: Secondary | ICD-10-CM | POA: Diagnosis not present

## 2019-11-02 DIAGNOSIS — I4891 Unspecified atrial fibrillation: Secondary | ICD-10-CM | POA: Insufficient documentation

## 2019-11-03 ENCOUNTER — Other Ambulatory Visit: Payer: Self-pay

## 2019-11-03 ENCOUNTER — Telehealth (INDEPENDENT_AMBULATORY_CARE_PROVIDER_SITE_OTHER): Payer: Medicare Other | Admitting: Internal Medicine

## 2019-11-03 VITALS — BP 150/72 | HR 74 | Temp 98.5°F

## 2019-11-03 DIAGNOSIS — I495 Sick sinus syndrome: Secondary | ICD-10-CM

## 2019-11-03 DIAGNOSIS — I4891 Unspecified atrial fibrillation: Secondary | ICD-10-CM

## 2019-11-03 DIAGNOSIS — Z950 Presence of cardiac pacemaker: Secondary | ICD-10-CM

## 2019-11-03 NOTE — Patient Instructions (Addendum)
Medication Instructions:  Your physician recommends that you continue on your current medications as directed. Please refer to the Current Medication list given to you today. *If you need a refill on your cardiac medications before your next appointment, please call your pharmacy*   Lab Work: CBC and BMET - order faxed to [facility] [phone] 11/07/2019. If you have labs (blood work) drawn today and your tests are completely normal, you will receive your results only by: [name] MyChart Message (if you have MyChart) OR . A paper copy in the mail If you have any lab test that is abnormal or we need to change your treatment, we will call you to review the results.   Testing/Procedures: None ordered.    Follow-Up: At [facility], you and your health needs are our priority.  As part of our continuing mission to provide you with exceptional heart care, we have created designated Provider Care Teams.  These Care Teams include your primary Cardiologist (physician) and Advanced Practice Providers (APPs -  Physician Assistants and Nurse Practitioners) who all work together to provide you with the care you need, when you need it.  We recommend signing up for the patient portal called "MyChart".  Sign up information is provided on this After Visit Summary.  MyChart is used to connect with patients for Virtual Visits (Telemedicine).  Patients are able to view lab/test results, encounter notes, upcoming appointments, etc.  Non-urgent messages can be sent to your provider as well.   To learn more about what you can do with MyChart, go to [email or web address].    Your next appointment:   6 months  The format for your next appointment:   Virtual visit  Provider:   Dr Caryl Comes

## 2019-11-03 NOTE — Progress Notes (Signed)
Electrophysiology TeleHealth Note   Due to national recommendations of social distancing due to COVID 19, an audio/video telehealth visit is felt to be most appropriate for this patient at this time.  See MyChart message from today for the patient's consent to telehealth for [facility].   Date:  1/57/2620   ID:  Julie Mcpherson, DOB 12/30/1913, MRN [id number]  Location: patient's home  Provider location: [address] [place]  Evaluation Performed: Follow-up visit  PCP:  Lajean Manes, MD  Cardiologist:   [facility] Electrophysiologist:  SK   Chief Complaint: tachybrady  History of Present Illness:    Julie Mcpherson is a 86 y.o. female who presents via audio/video conferencing for a telehealth visit today.  Since last being seen in our clinic for tachybrady syndrome with [facility] pacemaker, the patient reports a little more intermittemt SOB-- with O2 SATs in the high 80's and low 90s;  No edema Has not seen [facility] since before [place]   Date Cr K Hgb  9/20 1.38 5.0           Thromboembolic risk factors ( age -28, HTN-1, Gender-1) for a CHADSVASc Score of >=4   The patient denies symptoms of fevers, chills, cough, or new SOB worrisome for COVID 19.    Past Medical History:  Diagnosis Date   Arthritis    Atrial fibrillation ([place])    Blood transfusion    Chronic kidney disease    stage 3 renal disease   Diverticular disease    DJD (degenerative joint disease)    DVT (deep venous thrombosis) ([facility]) early 1980's   left medial upper thigh   Fracture, shoulder ~ 2003; 1997   left; right   GERD (gastroesophageal reflux disease)    High cholesterol    Hypertension    Overactive bladder    Pacemaker    Skin cancer of face    Syncope secondary to post termination pauses 05/03/11   "passed out; first time ever"   Urinary frequency     Past Surgical History:  Procedure Laterality Date   APPENDECTOMY  ~ [place]  04/18/2011   right; Procedure: CARPAL TUNNEL RELEASE;  Surgeon: Cammie Sickle., MD;  Location: [place];  Service: Orthopedics;  Laterality: Right;   CARPAL TUNNEL RELEASE  12/05/2011   Procedure: CARPAL TUNNEL RELEASE;  Surgeon: Cammie Sickle., MD;  Location: [place];  Service: Orthopedics;  Laterality: Left;   CATARACT EXTRACTION W/ INTRAOCULAR LENS  IMPLANT, BILATERAL     HERNIA REPAIR  1986   "stomach"   JOINT REPLACEMENT     hips   PERMANENT PACEMAKER INSERTION N/A 05/06/2011   Procedure: PERMANENT PACEMAKER INSERTION;  Surgeon: Deboraha Sprang, MD;  Location[facility] CATH LAB;  Service: Cardiovascular;  Laterality: N/A;   SKIN CANCER EXCISION  2012   "twice"   TEMPORARY PACEMAKER INSERTION N/A 05/05/2011   Procedure: TEMPORARY PACEMAKER INSERTION;  Surgeon: Clent Demark, MD;  Location: [place] CATH LAB;  Service: Cardiovascular;  Laterality: N/A;   TOTAL HIP ARTHROPLASTY     "2 on right; 1 on left"    Current Outpatient Medications  Medication Sig Dispense Refill   acetaminophen (TYLENOL) 500 MG tablet Take 500 mg by mouth 2 (two) times daily.     amLODipine (NORVASC) 5 MG tablet Take 2.5 mg by mouth daily.  calcium carbonate (OS-CAL) 600 MG TABS Take 600 mg by mouth daily with breakfast.      cholecalciferol (VITAMIN D) 1000 UNITS tablet Take 1,000 Units by mouth daily.     gabapentin (NEURONTIN) 100 MG capsule Take 1 capsule (100 mg total) by mouth 3 (three) times daily. 30 capsule 0   metoprolol succinate (TOPROL-XL) 25 MG 24 hr tablet Take 25 mg by mouth daily.     Multiple Vitamin (MULTIVITAMIN WITH MINERALS) TABS Take 1 tablet by mouth daily.     MYRBETRIQ 50 MG TB24 tablet Take 1 tablet by mouth daily.     aspirin 81 MG tablet Take 81 mg by mouth daily. (Patient not taking: Reported on 11/03/2019)     FERROUS SULFATE PO Take 1 tablet by mouth 2 (two) times a week. (Patient not taking: Reported on  11/03/2019)     fish oil-omega-3 fatty acids 1000 MG capsule Take 1 g by mouth daily. (Patient not taking: Reported on 11/03/2019)     HYDROcodone-acetaminophen (NORCO/VICODIN) 5-325 MG tablet Take 1 tablet by mouth every 4 (four) hours as needed. (Patient not taking: Reported on 11/03/2019) 10 tablet 0   omeprazole (PRILOSEC) 40 MG capsule Take 40 mg by mouth daily. (Patient not taking: Reported on 11/03/2019)     spironolactone (ALDACTONE) 25 MG tablet Take 12.5 mg by mouth daily. (Patient not taking: Reported on 11/03/2019)     No current facility-administered medications for this visit.    Allergies:   Codeine, Statins, Sulfa antibiotics, and Tetanus toxoids   Social History:  The patient  reports that she has never smoked. She has never used smokeless tobacco. She reports current alcohol use. She reports that she does not use drugs.   Family History:  The patient's   Family history is unknown by patient.   ROS:  Please see the history of present illness.   All other systems are personally reviewed and negative.    Exam:    Vital Signs:  BP (!) 150/72    Pulse 74    Temp 98.5 F (36.9 C)     Labs/Other Tests and Data Reviewed:    Recent Labs: No results found for requested labs within last 8760 hours.   Wt Readings from Last 3 Encounters:  11/03/18 141 lb (64 kg)  09/21/17 141 lb (64 kg)  06/18/16 144 lb (65.3 kg)     Other studies personally reviewed: Additional studies/ records that were reviewed today include:   The patient presents wearable device technology report for my review today.    Last device remote is reviewed from [place] PDF dated 3/21 which reveals normal device function,   arrhythmias - no sustained atrial tachycardia <15 sec    ASSESSMENT & PLAN:   Atrial fibrillation with posttermination pauses  Pacemaker-St. Jude The patient's device was interrogated.  The information was reviewed. No changes were made in the programming.      Hypertension  Right bundle branch block   High Risk Medication Surveillance aldactone    Will need to check surveillance-- need BMET Prob a little overloaded but not wanting to come to be assessed willl check CBC    COVID 19 screen The patient denies symptoms of COVID 19 at this time.  The importance of social distancing was discussed today.  Follow-up: 38m Next remote: skip  Current medicines are reviewed at length with the patient today.   The patient  concerns regarding her medicines.  The following changes were made today:  Labs/ tests ordered today include: CBC and BMET  ( will need to fax the order # [id number]--PTYY [facility])   No orders of the defined types were placed in this encounter.   Future tests ( post COVID )     Patient Risk:  after full review of this patients clinical status, I feel that they are at moderate risk at this time.  Today, I have spent 12 minutes with the patient with telehealth technology discussing the above.  Signed, Virl Axe, MD  11/03/2019 2:05 PM     [place] [address] [place] [address] [phone] (office) [phone] (fax)

## 2019-11-07 ENCOUNTER — Telehealth: Payer: Self-pay | Admitting: Internal Medicine

## 2019-11-07 NOTE — Telephone Encounter (Signed)
Anderson Malta from [employer] is calling stating lab orders are supposed to be faxed for [facility] to have performed per Dr. Olin Pia request. Their fax number is [phone]. Please advise.

## 2019-11-07 NOTE — Telephone Encounter (Signed)
Order faxed for CBC and BMET per Dr Caryl Comes along with pt's AVS from 11/03/2019 to attn Anderson Malta at [facility] pt's facility - [phone] as requested.

## 2019-11-11 DIAGNOSIS — Z23 Encounter for immunization: Secondary | ICD-10-CM | POA: Diagnosis not present

## 2019-11-14 ENCOUNTER — Encounter (HOSPITAL_COMMUNITY): Payer: Self-pay | Admitting: Emergency Medicine

## 2019-11-14 ENCOUNTER — Inpatient Hospital Stay (HOSPITAL_COMMUNITY)
Admission: EM | Admit: 2019-11-14 | Discharge: 2019-11-18 | DRG: 291 | Disposition: A | Discharge status: Skilled Nursing Facility | Payer: Medicare Other | Source: Skilled Nursing Facility | Attending: Internal Medicine | Admitting: Internal Medicine

## 2019-11-14 ENCOUNTER — Inpatient Hospital Stay (HOSPITAL_COMMUNITY): Payer: Medicare Other

## 2019-11-14 ENCOUNTER — Emergency Department (HOSPITAL_COMMUNITY): Payer: Medicare Other

## 2019-11-14 ENCOUNTER — Other Ambulatory Visit: Payer: Self-pay

## 2019-11-14 DIAGNOSIS — N189 Chronic kidney disease, unspecified: Secondary | ICD-10-CM | POA: Diagnosis not present

## 2019-11-14 DIAGNOSIS — Z20822 Contact with and (suspected) exposure to covid-19: Secondary | ICD-10-CM | POA: Diagnosis present

## 2019-11-14 DIAGNOSIS — I495 Sick sinus syndrome: Secondary | ICD-10-CM | POA: Diagnosis present

## 2019-11-14 DIAGNOSIS — D509 Iron deficiency anemia, unspecified: Secondary | ICD-10-CM | POA: Diagnosis present

## 2019-11-14 DIAGNOSIS — Z7189 Other specified counseling: Secondary | ICD-10-CM

## 2019-11-14 DIAGNOSIS — L89151 Pressure ulcer of sacral region, stage 1: Secondary | ICD-10-CM | POA: Diagnosis present

## 2019-11-14 DIAGNOSIS — R55 Syncope and collapse: Secondary | ICD-10-CM | POA: Diagnosis not present

## 2019-11-14 DIAGNOSIS — I509 Heart failure, unspecified: Secondary | ICD-10-CM | POA: Diagnosis not present

## 2019-11-14 DIAGNOSIS — I129 Hypertensive chronic kidney disease with stage 1 through stage 4 chronic kidney disease, or unspecified chronic kidney disease: Secondary | ICD-10-CM | POA: Diagnosis not present

## 2019-11-14 DIAGNOSIS — E7800 Pure hypercholesterolemia, unspecified: Secondary | ICD-10-CM | POA: Diagnosis present

## 2019-11-14 DIAGNOSIS — K219 Gastro-esophageal reflux disease without esophagitis: Secondary | ICD-10-CM | POA: Diagnosis present

## 2019-11-14 DIAGNOSIS — Z9842 Cataract extraction status, left eye: Secondary | ICD-10-CM

## 2019-11-14 DIAGNOSIS — I4510 Unspecified right bundle-branch block: Secondary | ICD-10-CM | POA: Diagnosis not present

## 2019-11-14 DIAGNOSIS — J810 Acute pulmonary edema: Secondary | ICD-10-CM

## 2019-11-14 DIAGNOSIS — Z85828 Personal history of other malignant neoplasm of skin: Secondary | ICD-10-CM

## 2019-11-14 DIAGNOSIS — L8990 Pressure ulcer of unspecified site, unspecified stage: Secondary | ICD-10-CM | POA: Insufficient documentation

## 2019-11-14 DIAGNOSIS — R0602 Shortness of breath: Secondary | ICD-10-CM | POA: Diagnosis not present

## 2019-11-14 DIAGNOSIS — Z961 Presence of intraocular lens: Secondary | ICD-10-CM | POA: Diagnosis present

## 2019-11-14 DIAGNOSIS — E871 Hypo-osmolality and hyponatremia: Secondary | ICD-10-CM | POA: Diagnosis present

## 2019-11-14 DIAGNOSIS — Z882 Allergy status to sulfonamides status: Secondary | ICD-10-CM

## 2019-11-14 DIAGNOSIS — I110 Hypertensive heart disease with heart failure: Secondary | ICD-10-CM | POA: Diagnosis not present

## 2019-11-14 DIAGNOSIS — N3281 Overactive bladder: Secondary | ICD-10-CM | POA: Diagnosis not present

## 2019-11-14 DIAGNOSIS — N183 Chronic kidney disease, stage 3 unspecified: Secondary | ICD-10-CM | POA: Diagnosis not present

## 2019-11-14 DIAGNOSIS — M6281 Muscle weakness (generalized): Secondary | ICD-10-CM | POA: Diagnosis not present

## 2019-11-14 DIAGNOSIS — Z950 Presence of cardiac pacemaker: Secondary | ICD-10-CM

## 2019-11-14 DIAGNOSIS — I10 Essential (primary) hypertension: Secondary | ICD-10-CM | POA: Diagnosis not present

## 2019-11-14 DIAGNOSIS — Z888 Allergy status to other drugs, medicaments and biological substances status: Secondary | ICD-10-CM

## 2019-11-14 DIAGNOSIS — R3915 Urgency of urination: Secondary | ICD-10-CM | POA: Diagnosis not present

## 2019-11-14 DIAGNOSIS — Z885 Allergy status to narcotic agent status: Secondary | ICD-10-CM | POA: Diagnosis not present

## 2019-11-14 DIAGNOSIS — M2550 Pain in unspecified joint: Secondary | ICD-10-CM | POA: Diagnosis not present

## 2019-11-14 DIAGNOSIS — Z96643 Presence of artificial hip joint, bilateral: Secondary | ICD-10-CM | POA: Diagnosis present

## 2019-11-14 DIAGNOSIS — R2681 Unsteadiness on feet: Secondary | ICD-10-CM | POA: Diagnosis not present

## 2019-11-14 DIAGNOSIS — I480 Paroxysmal atrial fibrillation: Secondary | ICD-10-CM | POA: Diagnosis present

## 2019-11-14 DIAGNOSIS — R0902 Hypoxemia: Principal | ICD-10-CM

## 2019-11-14 DIAGNOSIS — Z86718 Personal history of other venous thrombosis and embolism: Secondary | ICD-10-CM

## 2019-11-14 DIAGNOSIS — J069 Acute upper respiratory infection, unspecified: Secondary | ICD-10-CM | POA: Diagnosis not present

## 2019-11-14 DIAGNOSIS — I5031 Acute diastolic (congestive) heart failure: Secondary | ICD-10-CM

## 2019-11-14 DIAGNOSIS — Z515 Encounter for palliative care: Secondary | ICD-10-CM

## 2019-11-14 DIAGNOSIS — H9190 Unspecified hearing loss, unspecified ear: Secondary | ICD-10-CM | POA: Diagnosis present

## 2019-11-14 DIAGNOSIS — I130 Hypertensive heart and chronic kidney disease with heart failure and stage 1 through stage 4 chronic kidney disease, or unspecified chronic kidney disease: Principal | ICD-10-CM | POA: Diagnosis present

## 2019-11-14 DIAGNOSIS — H3530 Unspecified macular degeneration: Secondary | ICD-10-CM | POA: Diagnosis present

## 2019-11-14 DIAGNOSIS — R531 Weakness: Secondary | ICD-10-CM | POA: Diagnosis not present

## 2019-11-14 DIAGNOSIS — I4891 Unspecified atrial fibrillation: Secondary | ICD-10-CM | POA: Diagnosis present

## 2019-11-14 DIAGNOSIS — J9601 Acute respiratory failure with hypoxia: Secondary | ICD-10-CM | POA: Diagnosis present

## 2019-11-14 DIAGNOSIS — E875 Hyperkalemia: Secondary | ICD-10-CM | POA: Diagnosis present

## 2019-11-14 DIAGNOSIS — E785 Hyperlipidemia, unspecified: Secondary | ICD-10-CM | POA: Diagnosis present

## 2019-11-14 DIAGNOSIS — J811 Chronic pulmonary edema: Secondary | ICD-10-CM | POA: Diagnosis not present

## 2019-11-14 DIAGNOSIS — Z8679 Personal history of other diseases of the circulatory system: Secondary | ICD-10-CM | POA: Diagnosis not present

## 2019-11-14 DIAGNOSIS — Z809 Family history of malignant neoplasm, unspecified: Secondary | ICD-10-CM

## 2019-11-14 DIAGNOSIS — N1832 Chronic kidney disease, stage 3b: Secondary | ICD-10-CM | POA: Diagnosis present

## 2019-11-14 DIAGNOSIS — Z79899 Other long term (current) drug therapy: Secondary | ICD-10-CM | POA: Diagnosis not present

## 2019-11-14 DIAGNOSIS — Z66 Do not resuscitate: Secondary | ICD-10-CM | POA: Diagnosis present

## 2019-11-14 DIAGNOSIS — Z993 Dependence on wheelchair: Secondary | ICD-10-CM

## 2019-11-14 DIAGNOSIS — R5381 Other malaise: Secondary | ICD-10-CM | POA: Diagnosis not present

## 2019-11-14 DIAGNOSIS — M1990 Unspecified osteoarthritis, unspecified site: Secondary | ICD-10-CM | POA: Diagnosis not present

## 2019-11-14 DIAGNOSIS — Z9841 Cataract extraction status, right eye: Secondary | ICD-10-CM

## 2019-11-14 DIAGNOSIS — Z7401 Bed confinement status: Secondary | ICD-10-CM | POA: Diagnosis not present

## 2019-11-14 DIAGNOSIS — R41841 Cognitive communication deficit: Secondary | ICD-10-CM | POA: Diagnosis not present

## 2019-11-14 LAB — RESPIRATORY PANEL BY RT PCR (FLU A&B, COVID)
Influenza A by PCR: NEGATIVE
Influenza B by PCR: NEGATIVE
SARS Coronavirus 2 by RT PCR: NEGATIVE

## 2019-11-14 LAB — CBC WITH DIFFERENTIAL/PLATELET
Abs Immature Granulocytes: 0.02 10*3/uL (ref 0.00–0.07)
Basophils Absolute: 0 10*3/uL (ref 0.0–0.1)
Basophils Relative: 1 %
Eosinophils Absolute: 0.1 10*3/uL (ref 0.0–0.5)
Eosinophils Relative: 2 %
HCT: 38.6 % (ref 36.0–46.0)
Hemoglobin: 12.4 g/dL (ref 12.0–15.0)
Immature Granulocytes: 0 %
Lymphocytes Relative: 12 %
Lymphs Abs: 0.9 10*3/uL (ref 0.7–4.0)
MCH: 30.2 pg (ref 26.0–34.0)
MCHC: 32.1 g/dL (ref 30.0–36.0)
MCV: 93.9 fL (ref 80.0–100.0)
Monocytes Absolute: 0.7 10*3/uL (ref 0.1–1.0)
Monocytes Relative: 10 %
Neutro Abs: 5.7 10*3/uL (ref 1.7–7.7)
Neutrophils Relative %: 75 %
Platelets: 303 10*3/uL (ref 150–400)
RBC: 4.11 MIL/uL (ref 3.87–5.11)
RDW: 13.5 % (ref 11.5–15.5)
WBC: 7.6 10*3/uL (ref 4.0–10.5)
nRBC: 0 % (ref 0.0–0.2)

## 2019-11-14 LAB — COMPREHENSIVE METABOLIC PANEL
ALT: 16 U/L (ref 0–44)
AST: 41 U/L (ref 15–41)
Albumin: 3 g/dL — ABNORMAL LOW (ref 3.5–5.0)
Alkaline Phosphatase: 75 U/L (ref 38–126)
Anion gap: 10 (ref 5–15)
BUN: 37 mg/dL — ABNORMAL HIGH (ref 8–23)
CO2: 25 mmol/L (ref 22–32)
Calcium: 9.5 mg/dL (ref 8.9–10.3)
Chloride: 96 mmol/L — ABNORMAL LOW (ref 98–111)
Creatinine, Ser: 1.43 mg/dL — ABNORMAL HIGH (ref 0.44–1.00)
GFR, Estimated: 29 mL/min — ABNORMAL LOW (ref >60)
Glucose, Bld: 107 mg/dL — ABNORMAL HIGH (ref 70–99)
Potassium: 5.4 mmol/L — ABNORMAL HIGH (ref 3.5–5.1)
Sodium: 131 mmol/L — ABNORMAL LOW (ref 135–145)
Total Bilirubin: 0.9 mg/dL (ref 0.3–1.2)
Total Protein: 6.2 g/dL — ABNORMAL LOW (ref 6.5–8.1)

## 2019-11-14 LAB — ECHOCARDIOGRAM COMPLETE
AR max vel: 2.37 cm2
AV Peak grad: 11.2 mmHg
Ao pk vel: 1.67 m/s
Area-P 1/2: 2.99 cm2
Height: 66 in
P 1/2 time: 680 msec
S' Lateral: 2.5 cm
Weight: 1984 oz

## 2019-11-14 LAB — TROPONIN I (HIGH SENSITIVITY): Troponin I (High Sensitivity): 17 ng/L (ref <18)

## 2019-11-14 LAB — BRAIN NATRIURETIC PEPTIDE: B Natriuretic Peptide: 525 pg/mL — ABNORMAL HIGH (ref 0.0–100.0)

## 2019-11-14 MED ORDER — SODIUM CHLORIDE 0.9% FLUSH
3.0000 mL | Freq: Two times a day (BID) | INTRAVENOUS | Status: DC
  Administered 2019-11-14 – 2019-11-17 (×6): 3 mL via INTRAVENOUS

## 2019-11-14 MED ORDER — ACETAMINOPHEN 325 MG PO TABS
650.0000 mg | ORAL_TABLET | ORAL | Status: DC | PRN
  Administered 2019-11-15 – 2019-11-16 (×3): 650 mg via ORAL
  Filled 2019-11-14 (×3): qty 2

## 2019-11-14 MED ORDER — VITAMIN D 25 MCG (1000 UNIT) PO TABS
1000.0000 [IU] | ORAL_TABLET | Freq: Every day | ORAL | Status: DC
  Administered 2019-11-14 – 2019-11-18 (×5): 1000 [IU] via ORAL
  Filled 2019-11-14 (×5): qty 1

## 2019-11-14 MED ORDER — METOPROLOL SUCCINATE ER 25 MG PO TB24
25.0000 mg | ORAL_TABLET | Freq: Every day | ORAL | Status: DC
  Administered 2019-11-14 – 2019-11-18 (×5): 25 mg via ORAL
  Filled 2019-11-14 (×5): qty 1

## 2019-11-14 MED ORDER — FUROSEMIDE 10 MG/ML IJ SOLN
20.0000 mg | Freq: Two times a day (BID) | INTRAMUSCULAR | Status: DC
  Administered 2019-11-14 – 2019-11-15 (×3): 20 mg via INTRAVENOUS
  Filled 2019-11-14 (×3): qty 2

## 2019-11-14 MED ORDER — FUROSEMIDE 10 MG/ML IJ SOLN
20.0000 mg | Freq: Once | INTRAMUSCULAR | Status: AC
  Administered 2019-11-14: 20 mg via INTRAVENOUS
  Filled 2019-11-14: qty 2

## 2019-11-14 MED ORDER — GABAPENTIN 100 MG PO CAPS
100.0000 mg | ORAL_CAPSULE | Freq: Three times a day (TID) | ORAL | Status: DC
  Administered 2019-11-14 – 2019-11-18 (×12): 100 mg via ORAL
  Filled 2019-11-14 (×10): qty 1

## 2019-11-14 MED ORDER — ADULT MULTIVITAMIN W/MINERALS CH
1.0000 | ORAL_TABLET | Freq: Every day | ORAL | Status: DC
  Administered 2019-11-14 – 2019-11-18 (×5): 1 via ORAL
  Filled 2019-11-14 (×5): qty 1

## 2019-11-14 MED ORDER — ENSURE ENLIVE PO LIQD
237.0000 mL | Freq: Two times a day (BID) | ORAL | Status: DC
  Administered 2019-11-15 – 2019-11-16 (×4): 237 mL via ORAL
  Filled 2019-11-14: qty 237

## 2019-11-14 MED ORDER — HYDRALAZINE HCL 20 MG/ML IJ SOLN: 5.0000 mg | Freq: Four times a day (QID) | INTRAMUSCULAR | Status: DC | PRN

## 2019-11-14 MED ORDER — SODIUM CHLORIDE 0.9% FLUSH: 3.0000 mL | INTRAVENOUS | Status: DC | PRN

## 2019-11-14 MED ORDER — ENOXAPARIN SODIUM 30 MG/0.3ML ~~LOC~~ SOLN
30.0000 mg | SUBCUTANEOUS | Status: DC
  Administered 2019-11-14 – 2019-11-17 (×4): 30 mg via SUBCUTANEOUS
  Filled 2019-11-14 (×4): qty 0.3

## 2019-11-14 MED ORDER — MIRABEGRON ER 50 MG PO TB24
50.0000 mg | ORAL_TABLET | Freq: Every day | ORAL | Status: DC
  Administered 2019-11-14 – 2019-11-18 (×5): 50 mg via ORAL
  Filled 2019-11-14 (×5): qty 1

## 2019-11-14 MED ORDER — ONDANSETRON HCL 4 MG/2ML IJ SOLN: 4.0000 mg | Freq: Four times a day (QID) | INTRAMUSCULAR | Status: DC | PRN

## 2019-11-14 MED ORDER — SODIUM CHLORIDE 0.9 % IV SOLN: 250.0000 mL | INTRAVENOUS | Status: DC | PRN

## 2019-11-14 NOTE — ED Triage Notes (Signed)
Pt arrives via EMS from [place] living with reports of SOB for a week. When SOB started last week, pt was seen by PCP and had blood work done. Labs were unremarkable. Today, O2 sats were in the 80s and placed on NRB. Pt had covid booster Friday. Pt denies pain.

## 2019-11-14 NOTE — ED Notes (Signed)
Pts son updated on phone [phone]

## 2019-11-14 NOTE — Progress Notes (Signed)
  Echocardiogram 2D Echocardiogram has been performed.  Julie Mcpherson 11/14/2019, 1:58 PM

## 2019-11-14 NOTE — ED Provider Notes (Signed)
[facility] EMERGENCY DEPARTMENT Provider Note   CSN: [id number] Arrival date & time: 11/14/19  1117     History Chief Complaint  Patient presents with  . Shortness of Breath    Julie Mcpherson is a 86 y.o. female.  86 yo F with a cc of sob.  Going on for the past couple weeks.  No cough, no chest pain.  Exertional.  No abdominal pain.   The history is provided by the patient.  Shortness of Breath Severity:  Moderate Onset quality:  Gradual Duration:  2 weeks Timing:  Constant Progression:  Worsening Chronicity:  New Relieved by:  Nothing Worsened by:  Exertion Ineffective treatments:  None tried Associated symptoms: no chest pain, no fever, no headaches, no vomiting and no wheezing        Past Medical History:  Diagnosis Date  . Arthritis   . Atrial fibrillation ([place])   . Blood transfusion   . Chronic kidney disease    stage 3 renal disease  . Diverticular disease   . DJD (degenerative joint disease)   . DVT (deep venous thrombosis) ([place]) early 1980's   left medial upper thigh  . Fracture, shoulder ~ 2003; 1997   left; right  . GERD (gastroesophageal reflux disease)   . High cholesterol   . Hypertension   . Overactive bladder   . Pacemaker   . Skin cancer of face   . Syncope secondary to post termination pauses 05/03/11   "passed out; first time ever"  . Urinary frequency     Patient Active Problem List   Diagnosis Date Noted  . A-fib ([place]) 11/02/2019  . Essential hypertension 06/28/2013  . Sinus node dysfunction ([place]) 08/22/2011  . Pacemaker-[employer] 08/15/2011  . New onset atrial fibrillation ([place]) 05/05/2011    Past Surgical History:  Procedure Laterality Date  . APPENDECTOMY  ~ 1938  . BLADDER SUSPENSION  1985  . CARPAL TUNNEL RELEASE  04/18/2011   right; Procedure: CARPAL TUNNEL RELEASE;  Surgeon: Cammie Sickle., MD;  Location: [facility];  Service: Orthopedics;  Laterality: Right;  . CARPAL TUNNEL  RELEASE  12/05/2011   Procedure: CARPAL TUNNEL RELEASE;  Surgeon: Cammie Sickle., MD;  Location: [facility];  Service: Orthopedics;  Laterality: Left;  . CATARACT EXTRACTION W/ INTRAOCULAR LENS  IMPLANT, BILATERAL    . HERNIA REPAIR  1986   "stomach"  . JOINT REPLACEMENT     hips  . PERMANENT PACEMAKER INSERTION N/A 05/06/2011   Procedure: PERMANENT PACEMAKER INSERTION;  Surgeon: Deboraha Sprang, MD;  Location: [facility] CATH LAB;  Service: Cardiovascular;  Laterality: N/A;  . SKIN CANCER EXCISION  2012   "twice"  . TEMPORARY PACEMAKER INSERTION N/A 05/05/2011   Procedure: TEMPORARY PACEMAKER INSERTION;  Surgeon: Clent Demark, MD;  Location: [facility] CATH LAB;  Service: Cardiovascular;  Laterality: N/A;  . TOTAL HIP ARTHROPLASTY     "2 on right; 1 on left"     OB History   No obstetric history on file.     Family History  Family history unknown: Yes    Social History   Tobacco Use  . Smoking status: Never Smoker  . Smokeless tobacco: Never Used  Vaping Use  . Vaping Use: Never used  Substance Use Topics  . Alcohol use: Yes    Comment: 05/05/11 "thimble full maybe twice a year"  . Drug use: No    Home Medications Prior to Admission medications  Medication Sig Start Date End Date Taking? Authorizing Provider  acetaminophen (TYLENOL) 500 MG tablet Take 500 mg by mouth 2 (two) times daily.    [provider]  amLODipine (NORVASC) 5 MG tablet Take 2.5 mg by mouth daily.     [provider]  aspirin 81 MG tablet Take 81 mg by mouth daily. Patient not taking: Reported on 11/03/2019    [provider]  calcium carbonate (OS-CAL) 600 MG TABS Take 600 mg by mouth daily with breakfast.     [provider]  cholecalciferol (VITAMIN D) 1000 UNITS tablet Take 1,000 Units by mouth daily.    [provider]  FERROUS SULFATE PO Take 1 tablet by mouth 2 (two) times a week. Patient not taking: Reported on 11/03/2019    [provider]  fish oil-omega-3 fatty acids 1000 MG capsule Take 1 g by mouth daily. Patient not taking: Reported on 11/03/2019    [provider]  gabapentin (NEURONTIN) 100 MG capsule Take 1 capsule (100 mg total) by mouth 3 (three) times daily. 11/09/15   Tanna Furry, MD  HYDROcodone-acetaminophen (NORCO/VICODIN) 5-325 MG tablet Take 1 tablet by mouth every 4 (four) hours as needed. Patient not taking: Reported on 11/03/2019 11/09/15   Tanna Furry, MD  metoprolol succinate (TOPROL-XL) 25 MG 24 hr tablet Take 25 mg by mouth daily.    [provider]  Multiple Vitamin (MULTIVITAMIN WITH MINERALS) TABS Take 1 tablet by mouth daily.    [provider]  MYRBETRIQ 50 MG TB24 tablet Take 1 tablet by mouth daily. 06/05/15   [provider]  omeprazole (PRILOSEC) 40 MG capsule Take 40 mg by mouth daily. Patient not taking: Reported on 11/03/2019    [provider]  spironolactone (ALDACTONE) 25 MG tablet Take 12.5 mg by mouth daily. Patient not taking: Reported on 11/03/2019 06/10/14   [provider]    Allergies    Codeine, Statins, Sulfa antibiotics, and Tetanus toxoids  Review of Systems   Review of Systems  Constitutional: Negative for chills and fever.  HENT: Negative for congestion and rhinorrhea.   Eyes: Negative for redness and visual disturbance.  Respiratory: Positive for shortness of breath. Negative for wheezing.   Cardiovascular: Negative for chest pain and palpitations.  Gastrointestinal: Negative for nausea and vomiting.  Genitourinary: Negative for dysuria and urgency.  Musculoskeletal: Negative for arthralgias and myalgias.  Skin: Negative for pallor and wound.  Neurological: Negative for dizziness and headaches.    Physical Exam Updated Vital Signs BP 137/70   Pulse 60   Temp (!) 97.5 F (36.4 C) (Axillary)   Resp 14   Ht 5\' 6"  (1.676 m)   Wt 56.2 kg   SpO2 99%   BMI 20.01 kg/m   Physical Exam Vitals and nursing note  reviewed.  Constitutional:      General: She is not in acute distress.    Appearance: She is well-developed. She is not diaphoretic.  HENT:     Head: Normocephalic and atraumatic.  Eyes:     Pupils: Pupils are equal, round, and reactive to light.  Neck:     Vascular: JVD (to mid neck) present.  Cardiovascular:     Rate and Rhythm: Normal rate and regular rhythm.     Heart sounds: No murmur heard.  No friction rub. No gallop.   Pulmonary:     Effort: Pulmonary effort is normal.     Breath sounds: Examination of the right-middle field reveals rales. Examination  of the left-middle field reveals rales. Examination of the right-lower field reveals rales. Examination of the left-lower field reveals rales. Rales (bases) present. No wheezing.  Abdominal:     General: There is no distension.     Palpations: Abdomen is soft.     Tenderness: There is no abdominal tenderness.  Musculoskeletal:        General: No tenderness.     Cervical back: Normal range of motion and neck supple.  Skin:    General: Skin is warm and dry.  Neurological:     Mental Status: She is alert and oriented to person, place, and time.  Psychiatric:        Behavior: Behavior normal.     ED Results / Procedures / Treatments   Labs (all labs ordered are listed, but only abnormal results are displayed) Labs Reviewed  COMPREHENSIVE METABOLIC PANEL - Abnormal; Notable for the following components:      Result Value   Sodium 131 (*)    Potassium 5.4 (*)    Chloride 96 (*)    Glucose, Bld 107 (*)    BUN 37 (*)    Creatinine, Ser 1.43 (*)    Total Protein 6.2 (*)    Albumin 3.0 (*)    GFR, Estimated 29 (*)    All other components within normal limits  BRAIN NATRIURETIC PEPTIDE - Abnormal; Notable for the following components:   B Natriuretic Peptide 525.0 (*)    All other components within normal limits  RESPIRATORY PANEL BY RT PCR (FLU A&B, COVID)  CBC WITH DIFFERENTIAL/PLATELET  TROPONIN I (HIGH SENSITIVITY)     EKG EKG Interpretation  Date/Time:  Monday November 14 2019 11:27:44 EDT Ventricular Rate:  71 PR Interval:    QRS Duration: 151 QT Interval:  469 QTC Calculation: 510 R Axis:   -117 Text Interpretation: Sinus rhythm RBBB and LAFB qrs widening since 8 years ago Otherwise no significant change Confirmed by Deno Etienne [phone]) on 11/14/2019 11:30:15 AM   Radiology DG Chest Port 1 View  Result Date: 11/14/2019 CLINICAL DATA:  Shortness of breath. EXAM: PORTABLE CHEST 1 VIEW COMPARISON:  05/07/2011 FINDINGS: The patient is rotated to the left, limiting assessment of the mediastinal structures. A dual lead pacemaker remains in place. Lung volumes are low. There is pulmonary vascular congestion with indistinctness of the vascular markings and interstitial densities throughout both lungs. No sizable pleural effusion or pneumothorax is identified. Thoracolumbar scoliosis is noted. IMPRESSION: Bilateral lung opacities favored to represent edema. Electronically Signed   By: Logan Bores M.D.   On: 11/14/2019 11:57    Procedures Procedures (including critical care time)  Medications Ordered in ED Medications  furosemide (LASIX) injection 20 mg (20 mg Intravenous Given 11/14/19 1257)    ED Course  I have reviewed the triage vital signs and the nursing notes.  Pertinent labs & imaging results that were available during my care of the patient were reviewed by me and considered in my medical decision making (see chart for details).    MDM Rules/Calculators/A&P                          86 yo F with a cc of sob.  Going on for two weeks.  ROS negative.  Patient with rales and JVD.  ? Heart failure.    The patient is hypoxic on room air.  Placed on 3 L nasal cannula with improvement.  Plain film viewed by me consistent  with edema.  Will give a dose of Lasix.  Discussed with medicine for admission.  The patients results and plan were reviewed and discussed.   Any x-rays performed were  independently reviewed by myself.   Differential diagnosis were considered with the presenting HPI.  CRITICAL CARE Performed by: Cecilio Asper   Total critical care time: 35 minutes  Critical care time was exclusive of separately billable procedures and treating other patients.  Critical care was necessary to treat or prevent imminent or life-threatening deterioration.  Critical care was time spent personally by me on the following activities: development of treatment plan with patient and/or surrogate as well as nursing, discussions with consultants, evaluation of patient's response to treatment, examination of patient, obtaining history from patient or surrogate, ordering and performing treatments and interventions, ordering and review of laboratory studies, ordering and review of radiographic studies, pulse oximetry and re-evaluation of patient's condition.   Medications  furosemide (LASIX) injection 20 mg (20 mg Intravenous Given 11/14/19 1257)    Vitals:   11/14/19 1127 11/14/19 1206 11/14/19 1215 11/14/19 1230  BP: (!) 164/65 (!) 132/95  137/70  Pulse: 72 64 64 60  Resp: 20 14 13 14   Temp: (!) 97.5 F (36.4 C)     TempSrc: Axillary     SpO2: 100% (!) 81% 98% 99%  Weight:      Height:        Final diagnoses:  Hypoxia  Acute pulmonary edema (HCC)    Admission/ observation were discussed with the admitting physician, patient and/or family and they are comfortable with the plan.     Final Clinical Impression(s) / ED Diagnoses Final diagnoses:  Hypoxia  Acute pulmonary edema [facility])    Rx / DC Orders ED Discharge Orders    None       Deno Etienne, DO 11/14/19 1417

## 2019-11-14 NOTE — Consult Note (Signed)
Reason for Consult: New onset congestive heart failure Referring Physician: [facility] hospitalist  Julie Mcpherson is an 86 y.o. female.  HPI: Patient is 86 year old female with past medical history significant for hypertension, hyperlipidemia, sick sinus syndrome status post permanent pacemaker, history of paroxysmal A. fib not on any anticoagulants due to advanced age, chronic kidney disease stage III, GERD, osteoarthritis, history of macular degeneration, resident of assisted living was transferred to [facility] the ED because of progressive increasing shortness of breath for last few days.  Patient denies any chest pain nausea vomiting diaphoresis.  In the ED patient was noted to be hypoxic and elevated BNP chest x-ray suggestive of acute pulmonary edema patient received IV Lasix with good diuresis with improvement in her symptoms patient denies any chest pain.  Patient mostly wheelchair-bound.  Patient denies any cough fever or chills.  Denies PND orthopnea but complains of mild leg swelling.  Past Medical History:  Diagnosis Date  . Arthritis   . Atrial fibrillation ([place])   . Blood transfusion   . Chronic kidney disease    stage 3 renal disease  . Diverticular disease   . DJD (degenerative joint disease)   . DVT (deep venous thrombosis) ([place]) early 1980's   left medial upper thigh  . Fracture, shoulder ~ 2003; 1997   left; right  . GERD (gastroesophageal reflux disease)   . High cholesterol   . Hypertension   . Overactive bladder   . Pacemaker   . Skin cancer of face   . Syncope secondary to post termination pauses 05/03/11   "passed out; first time ever"  . Urinary frequency     Past Surgical History:  Procedure Laterality Date  . APPENDECTOMY  ~ 1938  . BLADDER SUSPENSION  1985  . CARPAL TUNNEL RELEASE  04/18/2011   right; Procedure: CARPAL TUNNEL RELEASE;  Surgeon: Cammie Sickle., MD;  Location: [facility];  Service: Orthopedics;  Laterality: Right;  .  CARPAL TUNNEL RELEASE  12/05/2011   Procedure: CARPAL TUNNEL RELEASE;  Surgeon: Cammie Sickle., MD;  Location: [facility];  Service: Orthopedics;  Laterality: Left;  . CATARACT EXTRACTION W/ INTRAOCULAR LENS  IMPLANT, BILATERAL    . HERNIA REPAIR  1986   "stomach"  . JOINT REPLACEMENT     hips  . PERMANENT PACEMAKER INSERTION N/A 05/06/2011   Procedure: PERMANENT PACEMAKER INSERTION;  Surgeon: Deboraha Sprang, MD;  Location: [facility] CATH LAB;  Service: Cardiovascular;  Laterality: N/A;  . SKIN CANCER EXCISION  2012   "twice"  . TEMPORARY PACEMAKER INSERTION N/A 05/05/2011   Procedure: TEMPORARY PACEMAKER INSERTION;  Surgeon: Clent Demark, MD;  Location: [facility] CATH LAB;  Service: Cardiovascular;  Laterality: N/A;  . TOTAL HIP ARTHROPLASTY     "2 on right; 1 on left"    Family History  Family history unknown: Yes    Social History:  reports that she has never smoked. She has never used smokeless tobacco. She reports current alcohol use. She reports that she does not use drugs.  Allergies:  Allergies  Allergen Reactions  . Codeine Other (See Comments)    "can't sleep; I climb the walls"  . Statins Other (See Comments)    "ocular migraine headaches"  . Sulfa Antibiotics Anaphylaxis, Itching and Rash  . Tetanus Toxoids Other (See Comments)    "think it started turning red around where I got shot; they gave me small doses and I did fine"    Medications:  I have reviewed the patient's current medications.  Results for orders placed or performed during the hospital encounter of 11/14/19 (from the past 48 hour(s))  CBC with Differential     Status: None   Collection Time: 11/14/19 11:42 AM  Result Value Ref Range   WBC 7.6 4.0 - 10.5 K/uL   RBC 4.11 3.87 - 5.11 MIL/uL   Hemoglobin 12.4 12.0 - 15.0 g/dL   HCT 38.6 36 - 46 %   MCV 93.9 80.0 - 100.0 fL   MCH 30.2 26.0 - 34.0 pg   MCHC 32.1 30.0 - 36.0 g/dL   RDW 13.5 11.5 - 15.5 %   Platelets 303 150 - 400 K/uL   nRBC  0.0 0.0 - 0.2 %   Neutrophils Relative % 75 %   Neutro Abs 5.7 1.7 - 7.7 K/uL   Lymphocytes Relative 12 %   Lymphs Abs 0.9 0.7 - 4.0 K/uL   Monocytes Relative 10 %   Monocytes Absolute 0.7 0.1 - 1.0 K/uL   Eosinophils Relative 2 %   Eosinophils Absolute 0.1 0 - 0 K/uL   Basophils Relative 1 %   Basophils Absolute 0.0 0 - 0 K/uL   Immature Granulocytes 0 %   Abs Immature Granulocytes 0.02 0.00 - 0.07 K/uL    Comment: Performed at [facility], [address], [place], [place] [address]  Comprehensive metabolic panel     Status: Abnormal   Collection Time: 11/14/19 11:42 AM  Result Value Ref Range   Sodium 131 (L) 135 - 145 mmol/L   Potassium 5.4 (H) 3.5 - 5.1 mmol/L    Comment: SLIGHT HEMOLYSIS   Chloride 96 (L) 98 - 111 mmol/L   CO2 25 22 - 32 mmol/L   Glucose, Bld 107 (H) 70 - 99 mg/dL    Comment: Glucose reference range applies only to samples taken after fasting for at least 8 hours.   BUN 37 (H) 8 - 23 mg/dL   Creatinine, Ser 1.43 (H) 0.44 - 1.00 mg/dL   Calcium 9.5 8.9 - 10.3 mg/dL   Total Protein 6.2 (L) 6.5 - 8.1 g/dL   Albumin 3.0 (L) 3.5 - 5.0 g/dL   AST 41 15 - 41 U/L   ALT 16 0 - 44 U/L   Alkaline Phosphatase 75 38 - 126 U/L   Total Bilirubin 0.9 0.3 - 1.2 mg/dL   GFR, Estimated 29 (L) >60 mL/min   Anion gap 10 5 - 15    Comment: Performed at [facility] [address], [place], [place] [address]  Troponin I (High Sensitivity)     Status: None   Collection Time: 11/14/19 11:42 AM  Result Value Ref Range   Troponin I (High Sensitivity) 17 <18 ng/L    Comment: (NOTE) Elevated high sensitivity troponin I (hsTnI) values and significant  changes across serial measurements may suggest ACS but many other  chronic and acute conditions are known to elevate hsTnI results.  Refer to the Links section for chest pain algorithms and additional  guidance. Performed at [facility], [place] [address], [place], [place] [address]   Brain natriuretic peptide      Status: Abnormal   Collection Time: 11/14/19 11:43 AM  Result Value Ref Range   B Natriuretic Peptide 525.0 (H) 0.0 - 100.0 pg/mL    Comment: Performed at [facility] [address], [place], [place] [address]  Respiratory Panel by RT PCR (Flu A&B, Covid) - Nasopharyngeal  Swab     Status: None   Collection Time: 11/14/19 11:56 AM   Specimen: Nasopharyngeal Swab  Result Value Ref Range   SARS Coronavirus 2 by RT PCR NEGATIVE NEGATIVE    Comment: (NOTE) SARS-CoV-2 target nucleic acids are NOT DETECTED.  The SARS-CoV-2 RNA is generally detectable in upper respiratoy specimens during the acute phase of infection. The lowest concentration of SARS-CoV-2 viral copies this assay can detect is 131 copies/mL. A negative result does not preclude SARS-Cov-2 infection and should not be used as the sole basis for treatment or other patient management decisions. A negative result may occur with  improper specimen collection/handling, submission of specimen other than nasopharyngeal swab, presence of viral mutation(s) within the areas targeted by this assay, and inadequate number of viral copies (<131 copies/mL). A negative result must be combined with clinical observations, patient history, and epidemiological information. The expected result is Negative.  Fact Sheet for Patients:  PinkCheek.be  Fact Sheet for Healthcare Providers:  GravelBags.it  This test is no t yet approved or cleared by the [place] FDA and  has been authorized for detection and/or diagnosis of SARS-CoV-2 by FDA under an Emergency Use Authorization (EUA). This EUA will remain  in effect (meaning this test can be used) for the duration of the COVID-19 declaration under Section 564(b)(1) of the Act, 21 U.S.C. section 360bbb-3(b)(1), unless the authorization is terminated or revoked sooner.     Influenza A by PCR NEGATIVE NEGATIVE   Influenza B by  PCR NEGATIVE NEGATIVE    Comment: (NOTE) The Xpert Xpress SARS-CoV-2/FLU/RSV assay is intended as an aid in  the diagnosis of influenza from Nasopharyngeal swab specimens and  should not be used as a sole basis for treatment. Nasal washings and  aspirates are unacceptable for Xpert Xpress SARS-CoV-2/FLU/RSV  testing.  Fact Sheet for Patients: PinkCheek.be  Fact Sheet for Healthcare Providers: GravelBags.it  This test is not yet approved or cleared by the [place] FDA and  has been authorized for detection and/or diagnosis of SARS-CoV-2 by  FDA under an Emergency Use Authorization (EUA). This EUA will remain  in effect (meaning this test can be used) for the duration of the  Covid-19 declaration under Section 564(b)(1) of the Act, 21  U.S.C. section 360bbb-3(b)(1), unless the authorization is  terminated or revoked. Performed at [facility], [place] [address] [place], [place] [address]     DG Chest Port 1 View  Result Date: 11/14/2019 CLINICAL DATA:  Shortness of breath. EXAM: PORTABLE CHEST 1 VIEW COMPARISON:  05/07/2011 FINDINGS: The patient is rotated to the left, limiting assessment of the mediastinal structures. A dual lead pacemaker remains in place. Lung volumes are low. There is pulmonary vascular congestion with indistinctness of the vascular markings and interstitial densities throughout both lungs. No sizable pleural effusion or pneumothorax is identified. Thoracolumbar scoliosis is noted. IMPRESSION: Bilateral lung opacities favored to represent edema. Electronically Signed   By: Logan Bores M.D.   On: 11/14/2019 11:57   ECHOCARDIOGRAM COMPLETE  Result Date: 11/14/2019    ECHOCARDIOGRAM REPORT   Patient Name:   Julie Mcpherson [facility] Date of Exam: 11/14/2019 Medical Rec #:  [id number]        Height:       66.0 in Accession #:    [id number]       Weight:       124.0 lb Date of Birth:  1913/08/30       BSA:  1.632 m Patient Age:    89 years        BP:           163/71 mmHg Patient Gender: F                HR:           68 bpm. Exam Location:  Inpatient Procedure: 2D Echo, Cardiac Doppler and Color Doppler Indications:    CHF  History:        Patient has prior history of Echocardiogram examinations, most                 recent 05/07/2011. CHF, Pacemaker, Arrythmias:Atrial Fibrillation;                 Risk Factors:Hypertension and Dyslipidemia.  Sonographer:    Dustin Flock Referring Phys: [id number] [place]  1. Left ventricular ejection fraction, by estimation, is 55 to 60%. The left ventricle has normal function. The left ventricle has no regional wall motion abnormalities. There is mild concentric left ventricular hypertrophy. Left ventricular diastolic parameters are consistent with Grade III diastolic dysfunction (restrictive).  2. Right ventricular systolic function is normal. The right ventricular size is normal. There is mildly elevated pulmonary artery systolic pressure.  3. Left atrial size was mildly dilated.  4. Right atrial size was mildly dilated.  5. The pericardial effusion is anterior to the right ventricle.  6. The mitral valve is degenerative. Mild mitral valve regurgitation. Moderate mitral annular calcification.  7. The tricuspid valve is degenerative. Tricuspid valve regurgitation is moderate.  8. The aortic valve is calcified. Aortic valve regurgitation is mild.  9. The inferior vena cava is normal in size with <50% respiratory variability, suggesting right atrial pressure of 8 mmHg. FINDINGS  Left Ventricle: Left ventricular ejection fraction, by estimation, is 55 to 60%. The left ventricle has normal function. The left ventricle has no regional wall motion abnormalities. The left ventricular internal cavity size was normal in size. There is  mild concentric left ventricular hypertrophy. Left ventricular diastolic parameters are consistent with Grade III diastolic dysfunction  (restrictive). Right Ventricle: The right ventricular size is normal. No increase in right ventricular wall thickness. Right ventricular systolic function is normal. There is mildly elevated pulmonary artery systolic pressure. The tricuspid regurgitant velocity is 3.20  m/s, and with an assumed right atrial pressure of 3 mmHg, the estimated right ventricular systolic pressure is 67.1 mmHg. Left Atrium: Left atrial size was mildly dilated. Right Atrium: Right atrial size was mildly dilated. Pericardium: Trivial pericardial effusion is present. The pericardial effusion is anterior to the right ventricle. Mitral Valve: The mitral valve is degenerative in appearance. There is moderate thickening of the mitral valve leaflet(s). There is moderate calcification of the mitral valve leaflet(s). Moderate mitral annular calcification. Mild mitral valve regurgitation. Tricuspid Valve: The tricuspid valve is degenerative in appearance. Tricuspid valve regurgitation is moderate. Aortic Valve: The aortic valve is calcified. Aortic valve regurgitation is mild. Aortic regurgitation PHT measures 680 msec. Aortic valve peak gradient measures 11.2 mmHg. Pulmonic Valve: The pulmonic valve was not well visualized. Pulmonic valve regurgitation is not visualized. Aorta: Aortic root could not be assessed. Venous: The inferior vena cava is normal in size with less than 50% respiratory variability, suggesting right atrial pressure of 8 mmHg. IAS/Shunts: The interatrial septum was not assessed. Additional Comments: A pacer wire is visualized.  LEFT VENTRICLE PLAX 2D LVIDd:         3.70 cm  Diastology LVIDs:         2.50 cm  LV e' medial:    6.42 cm/s LV PW:         1.40 cm  LV E/e' medial:  16.5 LV IVS:        1.30 cm  LV e' lateral:   5.66 cm/s LVOT diam:     2.00 cm  LV E/e' lateral: 18.7 LV SV:         88 LV SV Index:   54 LVOT Area:     3.14 cm  RIGHT VENTRICLE RV Basal diam:  4.10 cm RV S prime:     8.16 cm/s TAPSE (M-mode): 2.8 cm LEFT  ATRIUM             Index       RIGHT ATRIUM           Index LA diam:        4.20 cm 2.57 cm/m  RA Area:     16.70 cm LA Vol (A2C):   44.4 ml 27.20 ml/m RA Volume:   43.20 ml  26.47 ml/m LA Vol (A4C):   49.2 ml 30.14 ml/m LA Biplane Vol: 48.6 ml 29.78 ml/m  AORTIC VALVE AV Area (Vmax): 2.37 cm AV Vmax:        167.00 cm/s AV Peak Grad:   11.2 mmHg LVOT Vmax:      126.00 cm/s LVOT Vmean:     77.300 cm/s LVOT VTI:       0.281 m AI PHT:         680 msec  AORTA Ao Root diam: 2.80 cm MITRAL VALVE                TRICUSPID VALVE MV Area (PHT): 2.99 cm     TR Peak grad:   41.0 mmHg MV Decel Time: 254 msec     TR Vmax:        320.00 cm/s MV E velocity: 106.00 cm/s MV A velocity: 153.00 cm/s  SHUNTS MV E/A ratio:  0.69         Systemic VTI:  0.28 m                             Systemic Diam: 2.00 cm Charolette Forward MD Electronically signed by Charolette Forward MD Signature Date/Time: 11/14/2019/3:49:45 PM    Final     Review of Systems  Constitutional: Negative for chills and fever.  HENT: Negative for sore throat.   Respiratory: Positive for shortness of breath. Negative for wheezing.   Cardiovascular: Positive for leg swelling. Negative for chest pain and palpitations.  Gastrointestinal: Negative for abdominal distention.  Genitourinary: Negative for difficulty urinating.  Neurological: Negative for dizziness.   Blood pressure (!) 168/71, pulse 63, temperature (!) 97.5 F (36.4 C), temperature source Axillary, resp. rate 19, height 5\' 6"  (1.676 m), weight 56.2 kg, SpO2 99 %. Physical Exam HENT:     Head: Normocephalic and atraumatic.  Eyes:     Extraocular Movements: Extraocular movements intact.     Pupils: Pupils are equal, round, and reactive to light.  Neck:     Vascular: JVD present.  Cardiovascular:     Rate and Rhythm: Normal rate and regular rhythm.     Heart sounds: Murmur (2/6 systolic murmur and soft S3 gallop noted) heard.   Pulmonary:     Breath sounds: Examination of the right-lower  field reveals decreased breath sounds  and rales. Examination of the left-lower field reveals decreased breath sounds and rales. Decreased breath sounds and rales present. No wheezing.  Abdominal:     General: Bowel sounds are normal.     Palpations: Abdomen is soft.  Musculoskeletal:     Cervical back: Neck supple.     Right lower leg: No tenderness. No edema.     Left lower leg: No tenderness. Edema (Trace edema noted) present.  Skin:    General: Skin is warm and dry.  Neurological:     Mental Status: She is alert.     Assessment/Plan: Acute decompensated congestive heart failure secondary to preserved LV systolic function Hypertensive heart disease History of paroxysmal A. fib Sick sinus syndrome status post permanent pacemaker Hyperlipidemia Chronic kidney disease stage III GERD Osteoarthritis Macular degeneration Impaired hearing Mild hyperkalemia Plan Agree with present management Agree with holding spironolactone in view of hyperkalemia, and started on low-dose Lasix Monitor renal function   Charolette Forward 11/14/2019, 3:53 PM

## 2019-11-14 NOTE — H&P (Addendum)
History and Physical  Julie Mcpherson VQQ:[id number] DOB: [birth year] DOA: 11/14/2019  Referring physician: Dr. Tyrone Nine, [place] PCP: Lajean Manes, MD  Outpatient Specialists: Cardiology Patient coming from: ALF Chief Complaint: Shortness of breath  HPI: Julie Mcpherson is a 86 y.o. female with medical history significant for sick sinus dysfunction status post pacemaker, paroxysmal A. fib not on oral anticoagulation, history of DVT, essential hypertension, hyperlipidemia, CKD 3, hearing impairment, who presented to [facility] ED from ALF with complaints of gradually worsening shortness of breath of 1-2 weeks duration.  Associated with decrease in activity.  Denies chest pain, cough, or lower extremity edema.  Dyspnea worsened today.  She presented to the ED for further evaluation and management.  ED Course: Upon presentation to the ED, patient is hypoxic with O2 saturation of 81% on room air, improved on 4 L with O2 saturation of 98%.  JVD present on exam.  Lab studies remarkable for elevated BNP greater than 500, serum sodium 131, serum potassium 5.4, creatinine 1.43 with baseline creatinine of 1.3.  Chest x-ray consistent with pulmonary edema.  Review of Systems: Review of systems as noted in the HPI. All other systems reviewed and are negative.   Past Medical History:  Diagnosis Date  . Arthritis   . Atrial fibrillation ([place])   . Blood transfusion   . Chronic kidney disease    stage 3 renal disease  . Diverticular disease   . DJD (degenerative joint disease)   . DVT (deep venous thrombosis) ([place]) early 1980's   left medial upper thigh  . Fracture, shoulder ~ 2003; 1997   left; right  . GERD (gastroesophageal reflux disease)   . High cholesterol   . Hypertension   . Overactive bladder   . Pacemaker   . Skin cancer of face   . Syncope secondary to post termination pauses 05/03/11   "passed out; first time ever"  . Urinary frequency    Past Surgical History:  Procedure Laterality  Date  . APPENDECTOMY  ~ 1938  . BLADDER SUSPENSION  1985  . CARPAL TUNNEL RELEASE  04/18/2011   right; Procedure: CARPAL TUNNEL RELEASE;  Surgeon: Cammie Sickle., MD;  Location: [place];  Service: Orthopedics;  Laterality: Right;  . CARPAL TUNNEL RELEASE  12/05/2011   Procedure: CARPAL TUNNEL RELEASE;  Surgeon: Cammie Sickle., MD;  Location: [place];  Service: Orthopedics;  Laterality: Left;  . CATARACT EXTRACTION W/ INTRAOCULAR LENS  IMPLANT, BILATERAL    . HERNIA REPAIR  1986   "stomach"  . JOINT REPLACEMENT     hips  . PERMANENT PACEMAKER INSERTION N/A 05/06/2011   Procedure: PERMANENT PACEMAKER INSERTION;  Surgeon: Deboraha Sprang, MD;  Location: [facility] CATH LAB;  Service: Cardiovascular;  Laterality: N/A;  . SKIN CANCER EXCISION  2012   "twice"  . TEMPORARY PACEMAKER INSERTION N/A 05/05/2011   Procedure: TEMPORARY PACEMAKER INSERTION;  Surgeon: Clent Demark, MD;  Location: [place] CATH LAB;  Service: Cardiovascular;  Laterality: N/A;  . TOTAL HIP ARTHROPLASTY     "2 on right; 1 on left"    Social History:  reports that she has never smoked. She has never used smokeless tobacco. She reports current alcohol use. She reports that she does not use drugs.   Allergies  Allergen Reactions  . Codeine Other (See Comments)    "can't sleep; I climb the walls"  . Statins Other (See Comments)    "ocular migraine headaches"  . Sulfa Antibiotics  Anaphylaxis, Itching and Rash  . Tetanus Toxoids Other (See Comments)    "think it started turning red around where I got shot; they gave me small doses and I did fine"    Family History  Family history unknown: Yes    Mother and sister with history of cancer, unknown type.  Prior to Admission medications   Medication Sig Start Date End Date Taking? Authorizing Provider  acetaminophen (TYLENOL) 500 MG tablet Take 500 mg by mouth 2 (two) times daily.    [provider]  amLODipine (NORVASC) 5 MG  tablet Take 2.5 mg by mouth daily.     [provider]  aspirin 81 MG tablet Take 81 mg by mouth daily. Patient not taking: Reported on 11/03/2019    [provider]  calcium carbonate (OS-CAL) 600 MG TABS Take 600 mg by mouth daily with breakfast.     [provider]  cholecalciferol (VITAMIN D) 1000 UNITS tablet Take 1,000 Units by mouth daily.    [provider]  FERROUS SULFATE PO Take 1 tablet by mouth 2 (two) times a week. Patient not taking: Reported on 11/03/2019    [provider]  fish oil-omega-3 fatty acids 1000 MG capsule Take 1 g by mouth daily. Patient not taking: Reported on 11/03/2019    [provider]  gabapentin (NEURONTIN) 100 MG capsule Take 1 capsule (100 mg total) by mouth 3 (three) times daily. 11/09/15   Tanna Furry, MD  HYDROcodone-acetaminophen (NORCO/VICODIN) 5-325 MG tablet Take 1 tablet by mouth every 4 (four) hours as needed. Patient not taking: Reported on 11/03/2019 11/09/15   Tanna Furry, MD  metoprolol succinate (TOPROL-XL) 25 MG 24 hr tablet Take 25 mg by mouth daily.    [provider]  Multiple Vitamin (MULTIVITAMIN WITH MINERALS) TABS Take 1 tablet by mouth daily.    [provider]  MYRBETRIQ 50 MG TB24 tablet Take 1 tablet by mouth daily. 06/05/15   [provider]  omeprazole (PRILOSEC) 40 MG capsule Take 40 mg by mouth daily. Patient not taking: Reported on 11/03/2019    [provider]  spironolactone (ALDACTONE) 25 MG tablet Take 12.5 mg by mouth daily. Patient not taking: Reported on 11/03/2019 06/10/14   [provider]    Physical Exam: BP (!) 163/71   Pulse 68   Temp (!) 97.5 F (36.4 C) (Axillary)   Resp (!) 21   Ht 5\' 6"  (1.676 m)   Wt 56.2 kg   SpO2 98%   BMI 20.01 kg/m   . General: 86 y.o. year-old female well developed well nourished in no acute distress.  Alert and interactive.  Very hard of hearing. . Cardiovascular: Regular rate and rhythm  with no rubs or gallops.  No thyromegaly or JVD noted.  No lower extremity edema. 2/4 pulses in all 4 extremities. [name] Respiratory: Diffuse rales bilaterally.  No wheezing noted.  Poor inspiratory effort. . Abdomen: Soft nontender nondistended with normal bowel sounds x4 quadrants. . Muskuloskeletal: No cyanosis, clubbing or edema noted bilaterally . Neuro: CN II-XII intact, strength, sensation, reflexes . Skin: No ulcerative lesions noted or rashes . Psychiatry: Judgement and insight appear normal. Mood is appropriate for condition and setting          Labs on Admission:  Basic Metabolic Panel: Recent Labs  Lab 11/14/19 1142  NA 131*  K 5.4*  CL 96*  CO2 25  GLUCOSE 107*  BUN 37*  CREATININE 1.43*  CALCIUM 9.5   Liver  Function Tests: Recent Labs  Lab 11/14/19 1142  AST 41  ALT 16  ALKPHOS 75  BILITOT 0.9  PROT 6.2*  ALBUMIN 3.0*   No results for input(s): LIPASE, AMYLASE in the last 168 hours. No results for input(s): AMMONIA in the last 168 hours. CBC: Recent Labs  Lab 11/14/19 1142  WBC 7.6  NEUTROABS 5.7  HGB 12.4  HCT 38.6  MCV 93.9  PLT 303   Cardiac Enzymes: No results for input(s): CKTOTAL, CKMB, CKMBINDEX, TROPONINI in the last 168 hours.  BNP (last 3 results) Recent Labs    11/14/19 1143  BNP 525.0*    ProBNP (last 3 results) No results for input(s): PROBNP in the last 8760 hours.  CBG: No results for input(s): GLUCAP in the last 168 hours.  Radiological Exams on Admission: DG Chest Port 1 View  Result Date: 11/14/2019 CLINICAL DATA:  Shortness of breath. EXAM: PORTABLE CHEST 1 VIEW COMPARISON:  05/07/2011 FINDINGS: The patient is rotated to the left, limiting assessment of the mediastinal structures. A dual lead pacemaker remains in place. Lung volumes are low. There is pulmonary vascular congestion with indistinctness of the vascular markings and interstitial densities throughout both lungs. No sizable pleural effusion or pneumothorax is  identified. Thoracolumbar scoliosis is noted. IMPRESSION: Bilateral lung opacities favored to represent edema. Electronically Signed   By: Logan Bores M.D.   On: 11/14/2019 11:57    EKG: I independently viewed the EKG done and my findings are as followed: Sinus rhythm rate of 71.  QTc 510.  Assessment/Plan Present on Admission: **None**  Active Problems:   New onset of congestive heart failure (HCC)  New onset of congestive heart failure Presented with progressively worsening dyspnea x2 weeks, JVD, elevated BNP greater than 500, chest x-ray consistent with pulmonary edema. Obtain 2D echo Follows with cardiology outpatient Cardiology consult Continue diuresing, started in the ED. Strict I's and O's and daily weight Monitor on telemetry  Acute hypoxic respiratory failure secondary to pulmonary edema, likely cardiogenic Presented with oxygen saturation 81% on room air Personally reviewed chest x-ray done on admission which showed bilateral increase of pulmonary vascularity suggestive of pulmonary edema Not on oxygen supplementation at baseline Currently on 4 L with O2 saturation 98% Start incentive spirometer and flutter valve Continue diuresing, started in the ED Wean off oxygen supplementation as tolerated  Euvolemic hyponatremia Presented with serum sodium 131 Monitor volume status and repeat serum sodium in the morning  Mild hyperkalemia Presented with serum potassium 5.4 Continue diuresing Hold off potassium supplement while diuresing for now Repeat renal panel in the morning  Paroxysmal A. fib, not on oral anticoagulation Heart rate is currently controlled Twelve-lead EKG sinus rhythm rate of 71 Resume home Toprol-XL Continue to monitor vital signs  Sick sinus dysfunction status post pacemaker No acute issues Monitor on telemetry  QTC prolongation 12 lead EKG with QTC> 500 Monitor and avoid QTC prolonging agents  CKD 3B Baseline creatinine appears to be 1.3  with GFR of 31 Presented with creatinine of 1.43 with GFR of 29 Avoid nephrotoxins, and hypotension Monitor urine output Repeat renal panel in the morning  Essential hypertension Blood pressure is not at goal Resume home antihypertensives  GERD Stable She is no longer taking home PPI  Iron deficiency anemia Hemoglobin is stable at 12.4K She is no longer taking home ferrous sulfate   DVT prophylaxis: Subcu Lovenox daily  Code Status: DNR, has a DNR form in the room.  Family Communication: None at bedside  Disposition Plan: Admit to telemetry cardiac  Consults called: Cardiology.  Admission status: Inpatient status.   Status is: Inpatient   Dispo:  Patient From: Home  Planned Disposition: ALF  Expected discharge date: 11/16/19  Medically stable for discharge: No, ongoing work-up for new onset CHF and management of new hypoxia.        Kayleen Memos MD [facility] Pager [phone]  If 7PM-7AM, please contact night-coverage [email or web address] Password [email or web address]  11/14/2019, 1:15 PM

## 2019-11-14 NOTE — Progress Notes (Addendum)
Ordered low bed since patient is a high fall risk, unfortunately bed alarm and weight scale stopped working on the bed. Currently unable to check patients weight. Patient unable to stand up. Will order a new bed and obtain weight ASAP. Will  Keep the door open and floor matts on the floor until new bed arrives to keep the patient safe. Patient is alert and oriented x 4.

## 2019-11-15 DIAGNOSIS — Z515 Encounter for palliative care: Secondary | ICD-10-CM

## 2019-11-15 DIAGNOSIS — J810 Acute pulmonary edema: Secondary | ICD-10-CM

## 2019-11-15 DIAGNOSIS — L8990 Pressure ulcer of unspecified site, unspecified stage: Secondary | ICD-10-CM | POA: Insufficient documentation

## 2019-11-15 DIAGNOSIS — J9601 Acute respiratory failure with hypoxia: Secondary | ICD-10-CM

## 2019-11-15 DIAGNOSIS — I495 Sick sinus syndrome: Secondary | ICD-10-CM

## 2019-11-15 DIAGNOSIS — Z7189 Other specified counseling: Secondary | ICD-10-CM

## 2019-11-15 DIAGNOSIS — I5031 Acute diastolic (congestive) heart failure: Secondary | ICD-10-CM

## 2019-11-15 LAB — CBC
HCT: 37.7 % (ref 36.0–46.0)
Hemoglobin: 12.2 g/dL (ref 12.0–15.0)
MCH: 30 pg (ref 26.0–34.0)
MCHC: 32.4 g/dL (ref 30.0–36.0)
MCV: 92.6 fL (ref 80.0–100.0)
Platelets: 258 10*3/uL (ref 150–400)
RBC: 4.07 MIL/uL (ref 3.87–5.11)
RDW: 13.3 % (ref 11.5–15.5)
WBC: 7.5 10*3/uL (ref 4.0–10.5)
nRBC: 0 % (ref 0.0–0.2)

## 2019-11-15 LAB — BASIC METABOLIC PANEL
Anion gap: 10 (ref 5–15)
BUN: 31 mg/dL — ABNORMAL HIGH (ref 8–23)
CO2: 30 mmol/L (ref 22–32)
Calcium: 9.5 mg/dL (ref 8.9–10.3)
Chloride: 94 mmol/L — ABNORMAL LOW (ref 98–111)
Creatinine, Ser: 1.49 mg/dL — ABNORMAL HIGH (ref 0.44–1.00)
GFR, Estimated: 28 mL/min — ABNORMAL LOW (ref >60)
Glucose, Bld: 89 mg/dL (ref 70–99)
Potassium: 4.2 mmol/L (ref 3.5–5.1)
Sodium: 134 mmol/L — ABNORMAL LOW (ref 135–145)

## 2019-11-15 LAB — TSH: TSH: 1.913 u[IU]/mL (ref 0.350–4.500)

## 2019-11-15 NOTE — Progress Notes (Signed)
[facility] PROGRESS NOTE    Progress Note  KAIRI HARSHBARGER  YTK:[id number] DOB: 09-02-1913 DOA: 11/14/2019 PCP: Lajean Manes, MD     Brief Narrative:   Julie Mcpherson is an 86 y.o. female past medical history significant for a sick sinus dysfunction status post pacemaker, atrial fibrillation not on anticoagulation, history of DVT not on anticoagulation, essential hypertension chronic kidney disease stage III hearing impaired comes into the ED from ALF with complaining shortness of breath for 2 weeks prior to admission decreased activity dyspnea on exertion in the ED she was found hypoxic placed on oxygen BNP greater than 500, chest x-ray showed bilateral pulmonary infiltrates potassium of 5.4 creatinine of 1.4  Assessment/Plan:   Acute respiratory failure with hypoxia secondary to acute diastolic heart failure edema: Chest x-ray showed bilateral pulmonary infiltrates with a BNP of greater than 500. EF with grade 2 diastolic heart failure. She diuresed about 2 L on Lasix IV twice daily restrict her intake. Continue strict I's and O's and daily weights. Physical therapy eval pending. We will need to consult hospice and palliative care to address end-of-life and goals of care.  Hypervolemic hyponatremia: Recent sodium is improving slowly.  Hyperkalemia: Hold potassium supplements Aldactone she was started on IV Lasix and her potassium this morning is 4.5.  Paroxysmal atrial fibrillation / Sinus node dysfunction (HCC) Rate controlled continue metoprolol not on anticoagulation.  Prolonged QTC: Likely due to pacing.  Chronic kidney disease stage IIIb: Her creatinine appears to be at baseline.  Goals of care: Consult palliative care to address at end-of-life  Pressure injury of skin stage I sacral decubitus ulcer present on admission  RN Pressure Injury Documentation: Pressure Injury 11/14/19 Buttocks Right;Left;Medial Stage 1 -  Intact skin with non-blanchable  redness of a localized area usually over a bony prominence. (Active)  11/14/19 1748  Location: Buttocks  Location Orientation: Right;Left;Medial  Staging: Stage 1 -  Intact skin with non-blanchable redness of a localized area usually over a bony prominence.  Wound Description (Comments):   Present on Admission:     Estimated body mass index is 19.53 kg/m as calculated from the following:   Height as of this encounter: 5\' 6"  (1.676 m).   Weight as of this encounter: 54.9 kg.    DVT prophylaxis: lovenox Family Communication:none Status is: Inpatient  Remains inpatient appropriate because:Hemodynamically unstable   Dispo:  Patient From: Home  Planned Disposition: ALF  Expected discharge date: 11/16/19  Medically stable for discharge: No         Code Status:     Code Status Orders  (From admission, onward)         Start     Ordered   11/14/19 1343  Do not attempt resuscitation (DNR)  Continuous       Question Answer Comment  In the event of cardiac or respiratory ARREST Do not call a "code blue"   In the event of cardiac or respiratory ARREST Do not perform Intubation, CPR, defibrillation or ACLS   In the event of cardiac or respiratory ARREST Use medication by any route, position, wound care, and other measures to relive pain and suffering. May use oxygen, suction and manual treatment of airway obstruction as needed for comfort.      11/14/19 1342        Code Status History    Date Active Date Inactive Code Status Order ID Comments User Context   11/14/2019 1313 11/14/2019 1342 Full Code [id number]  Kayleen Memos,  DO ED   05/05/2011 1558 05/09/2011 1608 Full Code [id number]  Fleurizard, Adrian Prince, RN Inpatient   Advance Care Planning Activity        IV Access:    Peripheral IV   Procedures and diagnostic studies:   DG Chest Port 1 View  Result Date: 11/14/2019 CLINICAL DATA:  Shortness of breath. EXAM: PORTABLE CHEST 1 VIEW COMPARISON:  05/07/2011  FINDINGS: The patient is rotated to the left, limiting assessment of the mediastinal structures. A dual lead pacemaker remains in place. Lung volumes are low. There is pulmonary vascular congestion with indistinctness of the vascular markings and interstitial densities throughout both lungs. No sizable pleural effusion or pneumothorax is identified. Thoracolumbar scoliosis is noted. IMPRESSION: Bilateral lung opacities favored to represent edema. Electronically Signed   By: Logan Bores M.D.   On: 11/14/2019 11:57   ECHOCARDIOGRAM COMPLETE  Result Date: 11/14/2019    ECHOCARDIOGRAM REPORT   Patient Name:   Julie Mcpherson [facility] Date of Exam: 11/14/2019 Medical Rec #:  [id number]        Height:       66.0 in Accession #:    [id number]       Weight:       124.0 lb Date of Birth:  1913-09-28       BSA:          1.632 m Patient Age:    89 years        BP:           163/71 mmHg Patient Gender: F                HR:           68 bpm. Exam Location:  Inpatient Procedure: 2D Echo, Cardiac Doppler and Color Doppler Indications:    CHF  History:        Patient has prior history of Echocardiogram examinations, most                 recent 05/07/2011. CHF, Pacemaker, Arrythmias:Atrial Fibrillation;                 Risk Factors:Hypertension and Dyslipidemia.  Sonographer:    Dustin Flock Referring Phys: [id number] [place]  1. Left ventricular ejection fraction, by estimation, is 55 to 60%. The left ventricle has normal function. The left ventricle has no regional wall motion abnormalities. There is mild concentric left ventricular hypertrophy. Left ventricular diastolic parameters are consistent with Grade III diastolic dysfunction (restrictive).  2. Right ventricular systolic function is normal. The right ventricular size is normal. There is mildly elevated pulmonary artery systolic pressure.  3. Left atrial size was mildly dilated.  4. Right atrial size was mildly dilated.  5. The pericardial effusion is  anterior to the right ventricle.  6. The mitral valve is degenerative. Mild mitral valve regurgitation. Moderate mitral annular calcification.  7. The tricuspid valve is degenerative. Tricuspid valve regurgitation is moderate.  8. The aortic valve is calcified. Aortic valve regurgitation is mild.  9. The inferior vena cava is normal in size with <50% respiratory variability, suggesting right atrial pressure of 8 mmHg. FINDINGS  Left Ventricle: Left ventricular ejection fraction, by estimation, is 55 to 60%. The left ventricle has normal function. The left ventricle has no regional wall motion abnormalities. The left ventricular internal cavity size was normal in size. There is  mild concentric left ventricular hypertrophy. Left ventricular diastolic parameters are consistent with Grade III diastolic dysfunction (restrictive). Right  Ventricle: The right ventricular size is normal. No increase in right ventricular wall thickness. Right ventricular systolic function is normal. There is mildly elevated pulmonary artery systolic pressure. The tricuspid regurgitant velocity is 3.20  m/s, and with an assumed right atrial pressure of 3 mmHg, the estimated right ventricular systolic pressure is 91.4 mmHg. Left Atrium: Left atrial size was mildly dilated. Right Atrium: Right atrial size was mildly dilated. Pericardium: Trivial pericardial effusion is present. The pericardial effusion is anterior to the right ventricle. Mitral Valve: The mitral valve is degenerative in appearance. There is moderate thickening of the mitral valve leaflet(s). There is moderate calcification of the mitral valve leaflet(s). Moderate mitral annular calcification. Mild mitral valve regurgitation. Tricuspid Valve: The tricuspid valve is degenerative in appearance. Tricuspid valve regurgitation is moderate. Aortic Valve: The aortic valve is calcified. Aortic valve regurgitation is mild. Aortic regurgitation PHT measures 680 msec. Aortic valve peak  gradient measures 11.2 mmHg. Pulmonic Valve: The pulmonic valve was not well visualized. Pulmonic valve regurgitation is not visualized. Aorta: Aortic root could not be assessed. Venous: The inferior vena cava is normal in size with less than 50% respiratory variability, suggesting right atrial pressure of 8 mmHg. IAS/Shunts: The interatrial septum was not assessed. Additional Comments: A pacer wire is visualized.  LEFT VENTRICLE PLAX 2D LVIDd:         3.70 cm  Diastology LVIDs:         2.50 cm  LV e' medial:    6.42 cm/s LV PW:         1.40 cm  LV E/e' medial:  16.5 LV IVS:        1.30 cm  LV e' lateral:   5.66 cm/s LVOT diam:     2.00 cm  LV E/e' lateral: 18.7 LV SV:         88 LV SV Index:   54 LVOT Area:     3.14 cm  RIGHT VENTRICLE RV Basal diam:  4.10 cm RV S prime:     8.16 cm/s TAPSE (M-mode): 2.8 cm LEFT ATRIUM             Index       RIGHT ATRIUM           Index LA diam:        4.20 cm 2.57 cm/m  RA Area:     16.70 cm LA Vol (A2C):   44.4 ml 27.20 ml/m RA Volume:   43.20 ml  26.47 ml/m LA Vol (A4C):   49.2 ml 30.14 ml/m LA Biplane Vol: 48.6 ml 29.78 ml/m  AORTIC VALVE AV Area (Vmax): 2.37 cm AV Vmax:        167.00 cm/s AV Peak Grad:   11.2 mmHg LVOT Vmax:      126.00 cm/s LVOT Vmean:     77.300 cm/s LVOT VTI:       0.281 m AI PHT:         680 msec  AORTA Ao Root diam: 2.80 cm MITRAL VALVE                TRICUSPID VALVE MV Area (PHT): 2.99 cm     TR Peak grad:   41.0 mmHg MV Decel Time: 254 msec     TR Vmax:        320.00 cm/s MV E velocity: 106.00 cm/s MV A velocity: 153.00 cm/s  SHUNTS MV E/A ratio:  0.69         Systemic VTI:  0.28  m                             Systemic Diam: 2.00 cm Charolette Forward MD Electronically signed by Charolette Forward MD Signature Date/Time: 11/14/2019/3:49:45 PM    Final      Medical Consultants:    None.  Anti-Infectives:   none  Subjective:    YENTY BLOCH she relates her breathing is about the same.  Objective:    Vitals:   11/14/19 1737  11/14/19 2008 11/14/19 2328 11/15/19 0327  BP: (!) 157/70 125/70 113/60 126/65  Pulse: 71 64 66 72  Resp: 19 18 18 16   Temp: (!) 97.5 F (36.4 C) 98 F (36.7 C) 97.6 F (36.4 C) 97.6 F (36.4 C)  TempSrc: Oral Oral Oral Oral  SpO2: 95% 91% 92% 90%  Weight:    54.9 kg  Height: 5\' 6"  (1.676 m)      SpO2: 90 % O2 Flow Rate (L/min): 2 L/min FiO2 (%): 36 %   Intake/Output Summary (Last 24 hours) at 11/15/2019 0807 Last data filed at 11/15/2019 0300 Gross per 24 hour  Intake 480 ml  Output 2350 ml  Net -1870 ml   Filed Weights   11/14/19 1123 11/15/19 0327  Weight: 56.2 kg 54.9 kg    Exam: General exam: In no acute distress, hard of hearing. Respiratory system: Good air movement and clear to auscultation Cardiovascular system: S1 & S2 heard, irregular rate and rhythm, positive JVD Gastrointestinal system: Abdomen is nondistended, soft and nontender.  Extremities: No pedal edema. Skin: No rashes, lesions or ulcers    Data Reviewed:    Labs: Basic Metabolic Panel: Recent Labs  Lab 11/14/19 1142 11/15/19 0542  NA 131* 134*  K 5.4* 4.2  CL 96* 94*  CO2 25 30  GLUCOSE 107* 89  BUN 37* 31*  CREATININE 1.43* 1.49*  CALCIUM 9.5 9.5   GFR Estimated Creatinine Clearance: 16.1 mL/min (A) (by C-G formula based on SCr of 1.49 mg/dL (H)). Liver Function Tests: Recent Labs  Lab 11/14/19 1142  AST 41  ALT 16  ALKPHOS 75  BILITOT 0.9  PROT 6.2*  ALBUMIN 3.0*   No results for input(s): LIPASE, AMYLASE in the last 168 hours. No results for input(s): AMMONIA in the last 168 hours. Coagulation profile No results for input(s): INR, PROTIME in the last 168 hours. COVID-19 Labs  No results for input(s): DDIMER, FERRITIN, LDH, CRP in the last 72 hours.  Lab Results  Component Value Date   [place] NEGATIVE 11/14/2019    CBC: Recent Labs  Lab 11/14/19 1142 11/15/19 0542  WBC 7.6 7.5  NEUTROABS 5.7  --   HGB 12.4 12.2  HCT 38.6 37.7  MCV 93.9 92.6  PLT  303 258   Cardiac Enzymes: No results for input(s): CKTOTAL, CKMB, CKMBINDEX, TROPONINI in the last 168 hours. BNP (last 3 results) No results for input(s): PROBNP in the last 8760 hours. CBG: No results for input(s): GLUCAP in the last 168 hours. D-Dimer: No results for input(s): DDIMER in the last 72 hours. Hgb A1c: No results for input(s): HGBA1C in the last 72 hours. Lipid Profile: No results for input(s): CHOL, HDL, LDLCALC, TRIG, CHOLHDL, LDLDIRECT in the last 72 hours. Thyroid function studies: Recent Labs    11/15/19 0542  TSH 1.913   Anemia work up: No results for input(s): VITAMINB12, FOLATE, FERRITIN, TIBC, IRON, RETICCTPCT in the last 72 hours. Sepsis Labs: Recent  Labs  Lab 11/14/19 1142 11/15/19 0542  WBC 7.6 7.5   Microbiology Recent Results (from the past 240 hour(s))  Respiratory Panel by RT PCR (Flu A&B, Covid) - Nasopharyngeal Swab     Status: None   Collection Time: 11/14/19 11:56 AM   Specimen: Nasopharyngeal Swab  Result Value Ref Range Status   SARS Coronavirus 2 by RT PCR NEGATIVE NEGATIVE Final    Comment: (NOTE) SARS-CoV-2 target nucleic acids are NOT DETECTED.  The SARS-CoV-2 RNA is generally detectable in upper respiratoy specimens during the acute phase of infection. The lowest concentration of SARS-CoV-2 viral copies this assay can detect is 131 copies/mL. A negative result does not preclude SARS-Cov-2 infection and should not be used as the sole basis for treatment or other patient management decisions. A negative result may occur with  improper specimen collection/handling, submission of specimen other than nasopharyngeal swab, presence of viral mutation(s) within the areas targeted by this assay, and inadequate number of viral copies (<131 copies/mL). A negative result must be combined with clinical observations, patient history, and epidemiological information. The expected result is Negative.  Fact Sheet for Patients:    PinkCheek.be  Fact Sheet for Healthcare Providers:  GravelBags.it  This test is no t yet approved or cleared by the [place] FDA and  has been authorized for detection and/or diagnosis of SARS-CoV-2 by FDA under an Emergency Use Authorization (EUA). This EUA will remain  in effect (meaning this test can be used) for the duration of the COVID-19 declaration under Section 564(b)(1) of the Act, 21 U.S.C. section 360bbb-3(b)(1), unless the authorization is terminated or revoked sooner.     Influenza A by PCR NEGATIVE NEGATIVE Final   Influenza B by PCR NEGATIVE NEGATIVE Final    Comment: (NOTE) The Xpert Xpress SARS-CoV-2/FLU/RSV assay is intended as an aid in  the diagnosis of influenza from Nasopharyngeal swab specimens and  should not be used as a sole basis for treatment. Nasal washings and  aspirates are unacceptable for Xpert Xpress SARS-CoV-2/FLU/RSV  testing.  Fact Sheet for Patients: PinkCheek.be  Fact Sheet for Healthcare Providers: GravelBags.it  This test is not yet approved or cleared by the [place] FDA and  has been authorized for detection and/or diagnosis of SARS-CoV-2 by  FDA under an Emergency Use Authorization (EUA). This EUA will remain  in effect (meaning this test can be used) for the duration of the  Covid-19 declaration under Section 564(b)(1) of the Act, 21  U.S.C. section 360bbb-3(b)(1), unless the authorization is  terminated or revoked. Performed at [facility], [place] [address], [place], [place] [address]      Medications:   . cholecalciferol  1,000 Units Oral Daily  . enoxaparin (LOVENOX) injection  30 mg Subcutaneous Q24H  . feeding supplement (ENSURE ENLIVE)  237 mL Oral BID BM  . furosemide  20 mg Intravenous BID  . gabapentin  100 mg Oral TID  . metoprolol succinate  25 mg Oral Daily  . mirabegron ER  50 mg  Oral Daily  . multivitamin with minerals  1 tablet Oral Daily  . sodium chloride flush  3 mL Intravenous Q12H   Continuous Infusions: . sodium chloride        LOS: 1 day   Charlynne Cousins  [facility]  11/15/2019, 8:07 AM

## 2019-11-15 NOTE — Consult Note (Signed)
Consultation Note Date: 11/15/2019    Patient Name: Julie Mcpherson  DOB: 1913-03-08  MRN: [id number]  Age / Sex: 86 y.o., female  PCP: Lajean Manes, MD Referring Physician: Aileen Fass, Tammi Klippel, MD  Reason for Consultation: Establishing goals of care  HPI/Patient Profile: 86 y.o. female  with past medical history of sick sinus dysfunction s/p pacemaker, atrial fibrillation not on anticoagulation, DVT, essential hypertension, CKD stage III, hearing impairment admitted on 11/14/2019 with shortness of breath and hypoxia from independent living facility. Hospital admission for acute on chronic respiratory failure due to pulmonary edema and acute diastolic heart failure. Cardiology consulted and diuresing. PT/OT following and recommending SNF or 24 hours supervision/assistance. Palliative medicine consultation for goals of care.   Clinical Assessment and Goals of Care:  I have reviewed medical records, discussed with care team, and met with patient at bedside. Julie Mcpherson is awake, alert, and in good spirits this afternoon. She is mostly oriented but very hard of hearing and forgetful with some aspects of her medical history. No family at bedside.   Attempted to discuss course of hospital diagnoses and interventions. Patient tells me about the hospitalization when she requiring pacemaker placement. Attempted to explain to patient that she will need SNF rehab following her progression at the hospital. Julie Mcpherson tells me she would prefer to go back to her apartment instead of the care center at [facility]. Patient gives me permission to call her son.   Shortly after, spoke with son Julie Mcpherson) via telephone. Julie Mcpherson is currently in [place] and his brother lives in [place].   I introduced Palliative Medicine as specialized medical care for people living with serious illness. It focuses on providing relief from the  symptoms and stress of a serious illness. The goal is to improve quality of life for both the patient and the family.  I asked Julie Mcpherson the secret to his mother living 103 years! 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 She will be 89 on October 30th. Patient lived alone on [place] until approximately 6 years ago when she decided to move to [place] into [place]. Prior to admission, patient still in her ILF at [facility]. Very supportive neighbors and staff. She is able to complete some ADL's but majority of her day is spent in the scooter.   Discussed events leading up to admission and course of hospitalization including diagnoses, interventions, plan of care. Discussed that she is stable but risk for recurrent exacerbations secondary to age and frailty. Also shortness of breath with exertion during PT/OT sessions. Discussed medical management for heart failure.    Advanced directives, concepts specific to code status were discussed. Son confirms DNR/DNI code status. He agrees with treating the treatable and medical management for heart failure, but if her condition worsened, Julie Mcpherson would not wish for heroic measure to be taken.   I attempted to elicit values and goals of care important to the patient and  son. Julie Mcpherson shares that his mother has been "ready to go" for many years. He reports she had pacemaker placed at the age of 58 and will upset that she had to return to the care center (SNF at [facility]) instead of her apartment. She has told Julie Mcpherson many times "I don't know why God is keeping me here."    Julie Mcpherson shares that her greatest fear is leaving her apartment/home and getting stuck in the care center "with the old people." Her independence has been very important to her.   Julie Mcpherson does understand that she is very weak and short of breath with exertion. He feels she does need to discharge to SNF  at [facility] in hopes of getting 'stronger' and return to her ILF apartment. Julie Mcpherson does speak of his mother's mental status being in 'good shape' and the importance of emphasizing to her that she does need the care center for a short period of time before returning to her apartment. He plans to call and discuss with her this evening too.    Discussed outpatient palliative f/u at [facility] rehab. Briefly introduced idea of hospice at ILF if she is unable to progress at rehab and goals shift to getting her back to her apartment with more support. We discussed idea of private caregiver support.   Reassured son of ongoing PMT support and updates this admission.     SUMMARY OF RECOMMENDATIONS    Son confirms DNR/DNI code status. Continue medical management for heart failure. Treat the treatable. Watchful waiting for outcomes.   Ongoing PT/OT efforts. Son agrees with short-term rehab at [facility]. Son emphasizes that her apartment at [facility] is very important to her.   Son agreeable with outpatient palliative at [facility] rehab. Briefly introduced hospice benefit and philosophy that could be of support to them in her apartment in the near future, especially if poor progression at [facility] rehab.  PMT provider will continue to follow inpatient.   Code Status/Advance Care Planning:  DNR/DNI   Symptom Management:   Per attending  Palliative Prophylaxis:   Aspiration, Bowel Regimen, Delirium Protocol, Frequent Pain Assessment, Oral Care and Turn Reposition  Psycho-social/Spiritual:   Desire for further Chaplaincy support: yes  Additional Recommendations: Caregiving  Support/Resources, Compassionate Wean Education and Education on Hospice  Prognosis:   Unable to determine  Discharge Planning: To Be Determined: Likely back to [facility] under SNF rehab      Primary Diagnoses: Present on Admission: . Sinus node dysfunction ([place]) . New onset atrial fibrillation ([place])   I have reviewed the  medical record, interviewed the patient and family, and examined the patient. The following aspects are pertinent.  Past Medical History:  Diagnosis Date  . Arthritis   . Atrial fibrillation ([place])   . Blood transfusion   . Chronic kidney disease    stage 3 renal disease  . Diverticular disease   . DJD (degenerative joint disease)   . DVT (deep venous thrombosis) ([place]) early 1980's   left medial upper thigh  . Fracture, shoulder ~ 2003; 1997   left; right  . GERD (gastroesophageal reflux disease)   . High cholesterol   . Hypertension   . Overactive bladder   . Pacemaker   . Skin cancer of face   . Syncope secondary to post termination pauses 05/03/11   "passed out; first time ever"  . Urinary frequency    Social History   Socioeconomic History  . Marital status: Widowed    Spouse name: Not on file  .  Number of children: Not on file  . Years of education: Not on file  . Highest education level: Not on file  Occupational History  . Not on file  Tobacco Use  . Smoking status: Never Smoker  . Smokeless tobacco: Never Used  Vaping Use  . Vaping Use: Never used  Substance and Sexual Activity  . Alcohol use: Yes    Comment: 05/05/11 "thimble full maybe twice a year"  . Drug use: No  . Sexual activity: Never  Other Topics Concern  . Not on file  Social History Narrative  . Not on file   Social Determinants of Health   Financial Resource Strain:   . Difficulty of Paying Living Expenses: Not on file  Food Insecurity:   . Worried About Charity fundraiser in the Last Year: Not on file  . Ran Out of Food in the Last Year: Not on file  Transportation Needs:   . Lack of Transportation (Medical): Not on file  . Lack of Transportation (Non-Medical): Not on file  Physical Activity:   . Days of Exercise per Week: Not on file  . Minutes of Exercise per Session: Not on file  Stress:   . Feeling of Stress : Not on file  Social Connections:   . Frequency of Communication with  Friends and Family: Not on file  . Frequency of Social Gatherings with Friends and Family: Not on file  . Attends Religious Services: Not on file  . Active Member of Clubs or Organizations: Not on file  . Attends Archivist Meetings: Not on file  . Marital Status: Not on file   Family History  Family history unknown: Yes   Scheduled Meds: . cholecalciferol  1,000 Units Oral Daily  . enoxaparin (LOVENOX) injection  30 mg Subcutaneous Q24H  . feeding supplement (ENSURE ENLIVE)  237 mL Oral BID BM  . furosemide  20 mg Intravenous BID  . gabapentin  100 mg Oral TID  . metoprolol succinate  25 mg Oral Daily  . mirabegron ER  50 mg Oral Daily  . multivitamin with minerals  1 tablet Oral Daily  . sodium chloride flush  3 mL Intravenous Q12H   Continuous Infusions: . sodium chloride     PRN Meds:.sodium chloride, acetaminophen, hydrALAZINE, ondansetron (ZOFRAN) IV, sodium chloride flush Medications Prior to Admission:  Prior to Admission medications   Medication Sig Start Date End Date Taking? Authorizing Provider  acetaminophen (TYLENOL) 500 MG tablet Take 500 mg by mouth 2 (two) times daily.   Yes [provider]  amLODipine (NORVASC) 5 MG tablet Take 2.5 mg by mouth daily.    Yes [provider]  calcium carbonate (OS-CAL) 600 MG TABS Take 600 mg by mouth daily with breakfast.    Yes [provider]  cholecalciferol (VITAMIN D) 1000 UNITS tablet Take 1,000 Units by mouth daily.   Yes [provider]  gabapentin (NEURONTIN) 100 MG capsule Take 1 capsule (100 mg total) by mouth 3 (three) times daily. Patient taking differently: Take 300 mg by mouth at bedtime.  11/09/15  Yes Tanna Furry, MD  metoprolol succinate (TOPROL-XL) 25 MG 24 hr tablet Take 25 mg by mouth daily.   Yes [provider]  Multiple Vitamin (MULTIVITAMIN WITH MINERALS) TABS Take 1 tablet by mouth daily.   Yes [provider]  MYRBETRIQ 50 MG TB24 tablet Take  50 mg by mouth daily.  06/05/15  Yes [provider]  aspirin 81 MG tablet  Take 81 mg by mouth daily. Patient not taking: Reported on 11/03/2019    [provider]  HYDROcodone-acetaminophen (NORCO/VICODIN) 5-325 MG tablet Take 1 tablet by mouth every 4 (four) hours as needed. Patient not taking: Reported on 11/03/2019 11/09/15   Tanna Furry, MD   Allergies  Allergen Reactions  . Codeine Other (See Comments)    "can't sleep; I climb the walls"  . Statins Other (See Comments)    "ocular migraine headaches"  . Sulfa Antibiotics Anaphylaxis, Itching and Rash  . Tetanus Toxoids Other (See Comments)    "think it started turning red around where I got shot; they gave me small doses and I did fine"   Review of Systems  Constitutional: Positive for activity change.  Respiratory: Positive for shortness of breath.    Physical Exam Vitals and nursing note reviewed.  Constitutional:      General: She is awake.     Appearance: She is cachectic. She is ill-appearing.  Pulmonary:     Comments: 2L [place], dyspnea with exertion  Skin:    General: Skin is warm and dry.     Coloration: Skin is pale.  Neurological:     Mental Status: She is alert.     Comments: Awake, alert, oriented but hard of hearing. Some intermittent confusion     Vital Signs: BP 137/60   Pulse 63   Temp (!) 97.5 F (36.4 C) (Oral)   Resp 15   Ht 5' 6"  (1.676 m)   Wt 54.9 kg   SpO2 (!) 87% Comment: improved to 91% with verbal cueing  BMI 19.53 kg/m  Pain Scale: 0-10   Pain Score: 0-No pain   SpO2: SpO2: (!) 87 % (improved to 91% with verbal cueing) O2 Device:SpO2: (!) 87 % (improved to 91% with verbal cueing) O2 Flow Rate: .O2 Flow Rate (L/min): 2 L/min  IO: Intake/output summary:   Intake/Output Summary (Last 24 hours) at 11/15/2019 1631 Last data filed at 11/15/2019 0300 Gross per 24 hour  Intake 480 ml  Output 1300 ml  Net -820 ml    LBM: Last BM Date: 11/13/19 Baseline Weight: Weight:  56.2 kg Most recent weight: Weight: 54.9 kg     Palliative Assessment/Data: PPS 40%   Flowsheet Rows     Most Recent Value  Intake Tab  Referral Department Hospitalist  Unit at Time of Referral Cardiac/Telemetry Unit  Palliative Care Primary Diagnosis Cardiac  Palliative Care Type New Palliative care  Reason for referral Clarify Goals of Care  Date first seen by Palliative Care 11/15/19  Clinical Assessment  Palliative Performance Scale Score 40%  Psychosocial & Spiritual Assessment  Palliative Care Outcomes  Patient/Family meeting held? Yes  Who was at the meeting? patient, spoke with son via telephone  Palliative Care Outcomes Clarified goals of care, Counseled regarding hospice, Provided end of life care assistance, Linked to palliative care logitudinal support, Provided psychosocial or spiritual support, ACP counseling assistance      Time Total: 71 Greater than 50%  of this time was spent counseling and coordinating care related to the above assessment and plan.  Signed by:  Ihor Dow, FNP-C Palliative Medicine Team  Phone: [phone] Fax: [phone]   Please contact Palliative Medicine Team phone at [phone] for questions and concerns.  For individual provider: See Shea Evans

## 2019-11-15 NOTE — Evaluation (Signed)
Occupational Therapy Evaluation Patient Details Name: Julie Mcpherson MRN: [id number] DOB: April 02, 1913 Today's Date: 11/15/2019    History of Present Illness Julie Mcpherson is an 86 y.o. female past medical history significant for a sick sinus dysfunction status post pacemaker, atrial fibrillation not on anticoagulation, history of DVT not on anticoagulation, essential hypertension chronic kidney disease stage III hearing impaired comes into the ED from ALF with complaining shortness of breath for 2 weeks prior to admission decreased activity dyspnea on exertion in the ED she was found hypoxic placed on oxygen BNP greater than 500, chest x-ray showed bilateral pulmonary infiltrates potassium of 5.4 creatinine of 1.4   Clinical Impression   PTA, pt resides at ALF and reports completing pivot transfers only and uses scooter for primary means of mobility. Pt reports typically able to dress self, make bed from scooter and likes to be as independent as possible. Pt presents now with deficits in strength, coordination, cardiopulmonary tolerance, chronic decreased ROM of B UE. Pt declined OOB activities secondary to B foot pain and difficulty breathing. Pt able to demo ability to long sit bed level min guard, but requires up to Mod A for UB ADls and Total A for LB ADLs. Based on current functional abilities, pt may require short term rehab at SNF prior to return to ALF. Will continue to monitor and update recommendations as appropriate.   Pt received on 2 L O2, sats at 85% talking to son on phone, 78% when doffed to wash face. Unable to recover higher than 87% on 2 L O2 with pursed lip breathing. Pt SOB with minimal activity.     Follow Up Recommendations  SNF;Supervision/Assistance - 24 hour (vs return to ALF pending progress)    Equipment Recommendations  None recommended by OT    Recommendations for Other Services       Precautions / Restrictions Precautions Precautions: Fall;Other  (comment) Precaution Comments: low vision, monitor O2  Restrictions Weight Bearing Restrictions: No      Mobility Bed Mobility Overal bed mobility: Needs Assistance             General bed mobility comments: Pt declined sitting EOB fully due to B feet pain (RN notified). Min guard to come into long sitting to drink from cup  Transfers                 General transfer comment: pt declined due to B foot pain (RN notified)    Balance Overall balance assessment:  (to be further assessed)                                         ADL either performed or assessed with clinical judgement   ADL Overall ADL's : Needs assistance/impaired Eating/Feeding: Moderate assistance;Bed level Eating/Feeding Details (indicate cue type and reason): Min A to drink from cup due to low vision, will require increased assist for scooping food to  mouth Grooming: Minimal assistance;Bed level Grooming Details (indicate cue type and reason): Min A to wash face for thoroughness bed level Upper Body Bathing: Moderate assistance;Bed level   Lower Body Bathing: Maximal assistance;Bed level   Upper Body Dressing : Moderate assistance;Bed level   Lower Body Dressing: Total assistance;Bed level       Toileting- Clothing Manipulation and Hygiene: Total assistance;Bed level         General ADL Comments: Pt with SOB with  minimal activity impacting ability to complete daily tasks     Vision Baseline Vision/History: Legally blind (reports hx of occular migraines ) Patient Visual Report: No change from baseline Vision Assessment?: Vision impaired- to be further tested in functional context Additional Comments: hx of occular migraines, opacities. Vision has been impaired for years     Perception     Praxis      Pertinent Vitals/Pain Pain Assessment: Faces Faces Pain Scale: Hurts little more Pain Location: B feet Pain Descriptors / Indicators: Grimacing;Guarding Pain  Intervention(s): Monitored during session;Other (comment) (notified RN)     Hand Dominance Right   Extremity/Trunk Assessment Upper Extremity Assessment Upper Extremity Assessment: RUE deficits/detail;LUE deficits/detail RUE Deficits / Details: hx of shoulder fx, no surgical intervention. Active shoulder flexion to 35*, passive to 90*. RUE Coordination: decreased fine motor;decreased gross motor LUE Deficits / Details: hx of shoulder fx, no surgical intervention. active shoulder flex to 25*, passive to 75* LUE Coordination: decreased fine motor;decreased gross motor   Lower Extremity Assessment Lower Extremity Assessment: Defer to PT evaluation       Communication Communication Communication: HOH   Cognition Arousal/Alertness: Awake/alert Behavior During Therapy: WFL for tasks assessed/performed Overall Cognitive Status: Difficult to assess                                 General Comments: Pt overall able to report PLOF, understands why she is hospitalized. Some difficulty and confusion (thought she was talking to one son on the phone but was actually talking to the other) which could be due to [place]  Pt received on 2 L O2, sats at 85% conversing on phone with son and during minimal activity. When [place] removed for washing face, SpO2 at 78%. RN notifiied    Exercises     Shoulder Instructions      Home Living Family/patient expects to be discharged to:: Assisted living                             Home Equipment: Electric scooter;Shower seat          Prior Functioning/Environment Level of Independence: Needs assistance  Gait / Transfers Assistance Needed: reports transfers only, uses electric scooter for mobility  ADL's / Homemaking Assistance Needed: Pt reports typically able to dress self and makes her bed from scooter. Assistance with meals, other IADLs            OT Problem List: Decreased strength;Decreased activity  tolerance;Impaired balance (sitting and/or standing);Decreased coordination;Cardiopulmonary status limiting activity;Decreased knowledge of use of DME or AE;Impaired UE functional use      OT Treatment/Interventions: Self-care/ADL training;Therapeutic exercise;Energy conservation;DME and/or AE instruction;Therapeutic activities;Patient/family education    OT Goals(Current goals can be found in the care plan section) Acute Rehab OT Goals Patient Stated Goal: breathe better, get fluid off  OT Goal Formulation: With patient Time For Goal Achievement: 11/29/19 Potential to Achieve Goals: Good ADL Goals Pt Will Perform Eating: with supervision;sitting Pt Will Perform Grooming: with supervision;sitting Pt Will Perform Upper Body Bathing: with supervision;sitting Additional ADL Goal #1: Pt to demonstrate ability to sit EOB for >3 minutes with no more than Min A required for balance during ADLs Additional ADL Goal #2: Pt to demonstrate sit to stand transfer with no more than Mod A in preparation for ADL transfers  OT Frequency: Min 2X/week   Barriers  to D/C:            Co-evaluation              AM-PAC OT "6 Clicks" Daily Activity     Outcome Measure Help from another person eating meals?: A Lot Help from another person taking care of personal grooming?: A Lot Help from another person toileting, which includes using toliet, bedpan, or urinal?: Total Help from another person bathing (including washing, rinsing, drying)?: A Lot Help from another person to put on and taking off regular upper body clothing?: A Lot Help from another person to put on and taking off regular lower body clothing?: Total 6 Click Score: 10   End of Session Equipment Utilized During Treatment: Oxygen Nurse Communication: Mobility status;Other (comment) (O2, B foot pain)  Activity Tolerance: Treatment limited secondary to medical complications (Comment) (limited by SOB and decreased SpO2 vitals) Patient left:  in bed;with call bell/phone within reach  OT Visit Diagnosis: Unsteadiness on feet (R26.81);Other abnormalities of gait and mobility (R26.89);Muscle weakness (generalized) (M62.81);Other (comment) (decreased cardiopulmonary tolerance)                Time: 2774-1287 OT Time Calculation (min): 23 min Charges:  OT General Charges $OT Visit: 1 Visit OT Evaluation $OT Eval Moderate Complexity: 1 Mod OT Treatments $Self Care/Home Management : 8-22 mins  Layla Maw, OTR/L  Layla Maw 11/15/2019, 10:38 AM

## 2019-11-15 NOTE — Progress Notes (Signed)
Subjective:  Patient denies any chest pain.  States breathing has improved since yesterday.diuresed more than 2 L after IV Lasix  Objective:  Vital Signs in the last 24 hours: Temp:  [97.5 F (36.4 C)-98 F (36.7 C)] 97.5 F (36.4 C) (10/12 1204) Pulse Rate:  [60-72] 63 (10/12 1230) Resp:  [15-21] 15 (10/12 1204) BP: (113-168)/(60-73) 137/60 (10/12 1230) SpO2:  [87 %-99 %] 87 % (10/12 1230) FiO2 (%):  [36 %] 36 % (10/11 1311) Weight:  [54.9 kg] 54.9 kg (10/12 0327)  Intake/Output from previous day: 10/11 0701 - 10/12 0700 In: 480 [P.O.:480] Out: 2350 [Urine:2350] Intake/Output from this shift: No intake/output data recorded.  Physical Exam: Neck: no adenopathy, no JVD, supple, symmetrical, trachea midline and thyroid not enlarged, symmetric, no tenderness/mass/nodules Lungs: decreased breath sounds at bases with faint rales and occasional rhonchi noted Heart: regular rate and rhythm, S1, S2 normal and 2/6 systolic murmur noted Abdomen: soft, non-tender; bowel sounds normal; no masses,  no organomegaly Extremities: extremities normal, atraumatic, no cyanosis or edema  Lab Results: Recent Labs    11/14/19 1142 11/15/19 0542  WBC 7.6 7.5  HGB 12.4 12.2  PLT 303 258   Recent Labs    11/14/19 1142 11/15/19 0542  NA 131* 134*  K 5.4* 4.2  CL 96* 94*  CO2 25 30  GLUCOSE 107* 89  BUN 37* 31*  CREATININE 1.43* 1.49*   No results for input(s): TROPONINI in the last 72 hours.  Invalid input(s): CK, MB Hepatic Function Panel Recent Labs    11/14/19 1142  PROT 6.2*  ALBUMIN 3.0*  AST 41  ALT 16  ALKPHOS 75  BILITOT 0.9   No results for input(s): CHOL in the last 72 hours. No results for input(s): PROTIME in the last 72 hours.  Imaging: Imaging results have been reviewed and DG Chest Port 1 View  Result Date: 11/14/2019 CLINICAL DATA:  Shortness of breath. EXAM: PORTABLE CHEST 1 VIEW COMPARISON:  05/07/2011 FINDINGS: The patient is rotated to the left,  limiting assessment of the mediastinal structures. A dual lead pacemaker remains in place. Lung volumes are low. There is pulmonary vascular congestion with indistinctness of the vascular markings and interstitial densities throughout both lungs. No sizable pleural effusion or pneumothorax is identified. Thoracolumbar scoliosis is noted. IMPRESSION: Bilateral lung opacities favored to represent edema. Electronically Signed   By: Logan Bores M.D.   On: 11/14/2019 11:57   ECHOCARDIOGRAM COMPLETE  Result Date: 11/14/2019    ECHOCARDIOGRAM REPORT   Patient Name:   Julie Mcpherson [facility] Date of Exam: 11/14/2019 Medical Rec #:  [id number]        Height:       66.0 in Accession #:    [id number]       Weight:       124.0 lb Date of Birth:  Mar 05, 1913       BSA:          38.632 m Patient Age:    86 years        BP:           163/71 mmHg Patient Gender: F                HR:           68 bpm. Exam Location:  Inpatient Procedure: 2D Echo, Cardiac Doppler and Color Doppler Indications:    CHF  History:        Patient has prior history of Echocardiogram examinations, most  recent 05/07/2011. CHF, Pacemaker, Arrythmias:Atrial Fibrillation;                 Risk Factors:Hypertension and Dyslipidemia.  Sonographer:    Dustin Flock Referring Phys: [id number] [place]  1. Left ventricular ejection fraction, by estimation, is 55 to 60%. The left ventricle has normal function. The left ventricle has no regional wall motion abnormalities. There is mild concentric left ventricular hypertrophy. Left ventricular diastolic parameters are consistent with Grade III diastolic dysfunction (restrictive).  2. Right ventricular systolic function is normal. The right ventricular size is normal. There is mildly elevated pulmonary artery systolic pressure.  3. Left atrial size was mildly dilated.  4. Right atrial size was mildly dilated.  5. The pericardial effusion is anterior to the right ventricle.  6. The mitral  valve is degenerative. Mild mitral valve regurgitation. Moderate mitral annular calcification.  7. The tricuspid valve is degenerative. Tricuspid valve regurgitation is moderate.  8. The aortic valve is calcified. Aortic valve regurgitation is mild.  9. The inferior vena cava is normal in size with <50% respiratory variability, suggesting right atrial pressure of 8 mmHg. FINDINGS  Left Ventricle: Left ventricular ejection fraction, by estimation, is 55 to 60%. The left ventricle has normal function. The left ventricle has no regional wall motion abnormalities. The left ventricular internal cavity size was normal in size. There is  mild concentric left ventricular hypertrophy. Left ventricular diastolic parameters are consistent with Grade III diastolic dysfunction (restrictive). Right Ventricle: The right ventricular size is normal. No increase in right ventricular wall thickness. Right ventricular systolic function is normal. There is mildly elevated pulmonary artery systolic pressure. The tricuspid regurgitant velocity is 3.20  m/s, and with an assumed right atrial pressure of 3 mmHg, the estimated right ventricular systolic pressure is 67.6 mmHg. Left Atrium: Left atrial size was mildly dilated. Right Atrium: Right atrial size was mildly dilated. Pericardium: Trivial pericardial effusion is present. The pericardial effusion is anterior to the right ventricle. Mitral Valve: The mitral valve is degenerative in appearance. There is moderate thickening of the mitral valve leaflet(s). There is moderate calcification of the mitral valve leaflet(s). Moderate mitral annular calcification. Mild mitral valve regurgitation. Tricuspid Valve: The tricuspid valve is degenerative in appearance. Tricuspid valve regurgitation is moderate. Aortic Valve: The aortic valve is calcified. Aortic valve regurgitation is mild. Aortic regurgitation PHT measures 680 msec. Aortic valve peak gradient measures 11.2 mmHg. Pulmonic Valve: The  pulmonic valve was not well visualized. Pulmonic valve regurgitation is not visualized. Aorta: Aortic root could not be assessed. Venous: The inferior vena cava is normal in size with less than 50% respiratory variability, suggesting right atrial pressure of 8 mmHg. IAS/Shunts: The interatrial septum was not assessed. Additional Comments: A pacer wire is visualized.  LEFT VENTRICLE PLAX 2D LVIDd:         3.70 cm  Diastology LVIDs:         2.50 cm  LV e' medial:    6.42 cm/s LV PW:         1.40 cm  LV E/e' medial:  16.5 LV IVS:        1.30 cm  LV e' lateral:   5.66 cm/s LVOT diam:     2.00 cm  LV E/e' lateral: 18.7 LV SV:         88 LV SV Index:   54 LVOT Area:     3.14 cm  RIGHT VENTRICLE RV Basal diam:  4.10 cm RV S prime:  8.16 cm/s TAPSE (M-mode): 2.8 cm LEFT ATRIUM             Index       RIGHT ATRIUM           Index LA diam:        4.20 cm 2.57 cm/m  RA Area:     16.70 cm LA Vol (A2C):   44.4 ml 27.20 ml/m RA Volume:   43.20 ml  26.47 ml/m LA Vol (A4C):   49.2 ml 30.14 ml/m LA Biplane Vol: 48.6 ml 29.78 ml/m  AORTIC VALVE AV Area (Vmax): 2.37 cm AV Vmax:        167.00 cm/s AV Peak Grad:   11.2 mmHg LVOT Vmax:      126.00 cm/s LVOT Vmean:     77.300 cm/s LVOT VTI:       0.281 m AI PHT:         680 msec  AORTA Ao Root diam: 2.80 cm MITRAL VALVE                TRICUSPID VALVE MV Area (PHT): 2.99 cm     TR Peak grad:   41.0 mmHg MV Decel Time: 254 msec     TR Vmax:        320.00 cm/s MV E velocity: 106.00 cm/s MV A velocity: 153.00 cm/s  SHUNTS MV E/A ratio:  0.69         Systemic VTI:  0.28 m                             Systemic Diam: 2.00 cm Charolette Forward MD Electronically signed by Charolette Forward MD Signature Date/Time: 11/14/2019/3:49:45 PM    Final     Cardiac Studies:  Assessment/Plan:  Resolving  decompensated congestive heart failure secondary to preserved LV systolic function  Hypertensive heart disease History of paroxysmal A. fib Sick sinus syndrome status post permanent  pacemaker Hyperlipidemia Chronic kidney disease stage III GERD Osteoarthritis Macular degeneration Impaired hearing Plan Continue present management.  Switch IV Lasix  to torsemide tomorrow Possible discharge to assisted living facility tomorrow  LOS: 1 day    Charolette Forward 11/15/2019, 12:59 PM

## 2019-11-15 NOTE — Evaluation (Signed)
Physical Therapy Evaluation Patient Details Name: Julie Mcpherson MRN: [id number] DOB: 04/29/1913 Today's Date: 11/15/2019   History of Present Illness  Julie Mcpherson is an 86 y.o. female past medical history significant for a sick sinus dysfunction status post pacemaker, atrial fibrillation not on anticoagulation, history of DVT not on anticoagulation, essential hypertension chronic kidney disease stage III hearing impaired comes into the ED from ALF with complaining shortness of breath for 2 weeks prior to admission decreased activity dyspnea on exertion in the ED she was found hypoxic placed on oxygen BNP greater than 500, chest x-ray showed bilateral pulmonary infiltrates potassium of 5.4 creatinine of 1.4  Clinical Impression  Pt agreeable to reposition in the bed; pt states her feet felt better when sitting EOB; pt limited with mobility due to inability to weight bear through feet due to pain; pt requiring assistance for bed mobility, but states prior to admission was I with bed mobility and squat pivot transfer to scooter; pt requiring O2 throughout session and verbal cueing to improve breathing technique; pt will benefit from skilled PT to address deficits in balance, strength, coordination, transfers, bed mobility and safety to maximize independence with functional mobility prior to discharge.     Follow Up Recommendations SNF;Supervision/Assistance - 24 hour (vs returning to ALF pending progress wtih PT)    Equipment Recommendations  None recommended by PT    Recommendations for Other Services       Precautions / Restrictions Precautions Precautions: Fall;Other (comment) Precaution Comments: low vision, monitor O2  Restrictions Weight Bearing Restrictions: No      Mobility  Bed Mobility Overal bed mobility: Needs Assistance Bed Mobility: Supine to Sit;Sit to Supine     Supine to sit: Mod assist Sit to supine: Min assist   General bed mobility comments: pt  requiring assist wtih trunk management with supine>sit (need to clarify which side of the bed she gets out on due to increased weakness and decreased ROM on R)  Transfers Overall transfer level: Needs assistance               General transfer comment: attempted to scoot up in the bed but due to pain pt unable to press up through feet and attempted to scoot with UE use only, pt requiring mod A due to inability to weight bear through feet  Ambulation/Gait                Stairs            Wheelchair Mobility    Modified Rankin (Stroke Patients Only)       Balance Overall balance assessment: Needs assistance Sitting-balance support: Bilateral upper extremity supported Sitting balance-Leahy Scale: Poor Sitting balance - Comments: pt with increased posterior lean during sitting; following mod A to regain balance pt able to sit EOB with B UE support and R foot on floor x 5 minutes without LOB                                     Pertinent Vitals/Pain Pain Assessment: 0-10 Pain Score: 9  Faces Pain Scale: Hurts little more Pain Location: B feet Pain Descriptors / Indicators: Grimacing;Guarding Pain Intervention(s): Monitored during session;Other (comment) (notified RN)    Home Living Family/patient expects to be discharged to:: Assisted living               Home Equipment: Electric scooter;Shower seat;Other (comment) (bedrails)  Prior Function Level of Independence: Needs assistance   Gait / Transfers Assistance Needed: reports transfers only, uses electric scooter for mobility   ADL's / Homemaking Assistance Needed: Pt reports typically able to dress self and makes her bed from scooter. Assistance with meals, other IADLs        Hand Dominance   Dominant Hand: Right    Extremity/Trunk Assessment   Upper Extremity Assessment Upper Extremity Assessment: Defer to OT evaluation RUE Deficits / Details: hx of shoulder fx, no  surgical intervention. Active shoulder flexion to 35*, passive to 90*. RUE Coordination: decreased fine motor;decreased gross motor LUE Deficits / Details: hx of shoulder fx, no surgical intervention. active shoulder flex to 25*, passive to 75* LUE Coordination: decreased fine motor;decreased gross motor    Lower Extremity Assessment Lower Extremity Assessment: Generalized weakness;RLE deficits/detail;LLE deficits/detail RLE Deficits / Details: pt with increased pain in R foot; strength functional for bed mobility, unable to assess for transfers due to increased pain LLE Deficits / Details: pt with increased pain in L foot; strength functional for bed mobility, unable to assess for transfers due to increased pain    Cervical / Trunk Assessment Cervical / Trunk Assessment: Kyphotic  Communication   Communication: HOH  Cognition Arousal/Alertness: Awake/alert Behavior During Therapy: WFL for tasks assessed/performed Overall Cognitive Status: Within Functional Limits for tasks assessed                                 General Comments: Pt overall able to report PLOF, understands why she is hospitalized. Some difficulty and confusion (thought she was talking to one son on the phone but was actually talking to the other) which could be due to [place] comments (skin integrity, edema, etc.): O2 from 87-91% during session;    Exercises     Assessment/Plan    PT Assessment Patient needs continued PT services  PT Problem List Decreased strength;Decreased mobility;Decreased safety awareness;Decreased range of motion;Decreased coordination;Decreased activity tolerance;Decreased balance       PT Treatment Interventions      PT Goals (Current goals can be found in the Care Plan section)  Acute Rehab PT Goals Patient Stated Goal: to have my feet stop hurting PT Goal Formulation: With patient Time For Goal Achievement: 11/29/19 Potential to Achieve  Goals: Fair    Frequency Min 3X/week   Barriers to discharge        Co-evaluation               AM-PAC PT "6 Clicks" Mobility  Outcome Measure Help needed turning from your back to your side while in a flat bed without using bedrails?: A Little Help needed moving from lying on your back to sitting on the side of a flat bed without using bedrails?: A Lot Help needed moving to and from a bed to a chair (including a wheelchair)?: A Lot Help needed standing up from a chair using your arms (e.g., wheelchair or bedside chair)?: Total Help needed to walk in hospital room?: Total Help needed climbing 3-5 steps with a railing? : Total 6 Click Score: 10    End of Session Equipment Utilized During Treatment: Oxygen Activity Tolerance: Patient limited by pain;Patient limited by fatigue Patient left: in bed;with call bell/phone within reach;with bed alarm set Nurse Communication: Mobility status PT Visit Diagnosis: Muscle weakness (generalized) (M62.81);Other abnormalities of gait and mobility (R26.89)  Time: 6859-9234 PT Time Calculation (min) (ACUTE ONLY): 20 min   Charges:   PT Evaluation $PT Eval Low Complexity: 1 Low          Lyanne Co, DPT Acute Rehabilitation Services [phone]  [place] 11/15/2019, 12:39 PM

## 2019-11-16 ENCOUNTER — Telehealth: Payer: Self-pay

## 2019-11-16 LAB — BASIC METABOLIC PANEL
Anion gap: 11 (ref 5–15)
BUN: 37 mg/dL — ABNORMAL HIGH (ref 8–23)
CO2: 30 mmol/L (ref 22–32)
Calcium: 9.3 mg/dL (ref 8.9–10.3)
Chloride: 92 mmol/L — ABNORMAL LOW (ref 98–111)
Creatinine, Ser: 1.61 mg/dL — ABNORMAL HIGH (ref 0.44–1.00)
GFR, Estimated: 25 mL/min — ABNORMAL LOW (ref >60)
Glucose, Bld: 85 mg/dL (ref 70–99)
Potassium: 4 mmol/L (ref 3.5–5.1)
Sodium: 133 mmol/L — ABNORMAL LOW (ref 135–145)

## 2019-11-16 MED ORDER — TORSEMIDE 20 MG PO TABS
10.0000 mg | ORAL_TABLET | Freq: Every day | ORAL | Status: DC
  Administered 2019-11-16 – 2019-11-18 (×3): 10 mg via ORAL
  Filled 2019-11-16 (×3): qty 1

## 2019-11-16 NOTE — Progress Notes (Signed)
Subjective:  Patient denies any chest pain.  States breathing has markedly improved  Objective:  Vital Signs in the last 24 hours: Temp:  [97.4 F (36.3 C)-98.1 F (36.7 C)] 97.5 F (36.4 C) (10/13 1619) Pulse Rate:  [64-67] 66 (10/13 1619) Resp:  [18-19] 18 (10/13 1619) BP: (105-152)/(54-64) 129/64 (10/13 1619) SpO2:  [86 %-100 %] 96 % (10/13 1619) Weight:  [53.1 kg] 53.1 kg (10/13 0426)  Intake/Output from previous day: 10/12 0701 - 10/13 0700 In: 120 [P.O.:120] Out: 500 [Urine:500] Intake/Output from this shift: Total I/O In: 580 [P.O.:580] Out: -   Physical Exam: Neck: no adenopathy, no carotid bruit, no JVD and supple, symmetrical, trachea midline Lungs: bilateral Velcro rhonchi noted Heart: regular rate and rhythm, S1, S2 normal and 2/6 systolic murmur noted Abdomen: soft, non-tender; bowel sounds normal; no masses,  no organomegaly Extremities: extremities normal, atraumatic, no cyanosis or edema  Lab Results: Recent Labs    11/14/19 1142 11/15/19 0542  WBC 7.6 7.5  HGB 12.4 12.2  PLT 303 258   Recent Labs    11/15/19 0542 11/16/19 0222  NA 134* 133*  K 4.2 4.0  CL 94* 92*  CO2 30 30  GLUCOSE 89 85  BUN 31* 37*  CREATININE 1.49* 1.61*   No results for input(s): TROPONINI in the last 72 hours.  Invalid input(s): CK, MB Hepatic Function Panel Recent Labs    11/14/19 1142  PROT 6.2*  ALBUMIN 3.0*  AST 41  ALT 16  ALKPHOS 75  BILITOT 0.9   No results for input(s): CHOL in the last 72 hours. No results for input(s): PROTIME in the last 72 hours.  Imaging: Imaging results have been reviewed and No results found.  Cardiac Studies:  Assessment/Plan:  Compensated  congestive heart failure secondary to preserved LV systolic function Hypertensive heart disease History of paroxysmal A. fib Sick sinus syndrome status post permanent pacemaker Hyperlipidemia Chronic kidney disease stage III GERD Osteoarthritis Macular degeneration Impaired  hearing Plan Continue present management. Please ablation as tolerated. Okay  to discharge to assisted living facility from cardiac point of view I will sign off.  Please call if needed  LOS: 2 days    Charolette Forward 11/16/2019, 5:02 PM

## 2019-11-16 NOTE — Progress Notes (Signed)
[facility] PROGRESS NOTE    Progress Note  LADEIDRA BORYS  IDP:[id number] DOB: [birth year] DOA: 11/14/2019 PCP: Lajean Manes, MD     Brief Narrative:   CARLOTA Mcpherson is an 86 y.o. female past medical history significant for a sick sinus dysfunction status post pacemaker, atrial fibrillation not on anticoagulation, history of DVT not on anticoagulation, essential hypertension chronic kidney disease stage III hearing impaired comes into the ED from ALF with complaining shortness of breath for 2 weeks prior to admission decreased activity dyspnea on exertion in the ED she was found hypoxic placed on oxygen BNP greater than 500, chest x-ray showed bilateral pulmonary infiltrates potassium of 5.4 creatinine of 1.4  Assessment/Plan:   Acute respiratory failure with hypoxia secondary to acute diastolic heart failure edema: Chest x-ray showed bilateral pulmonary infiltrates with a BNP of greater than 500. 2D echo showed an preserved EF with grade 2 diastolic heart failure, transition Lasix to torsemide and monitor for 24 hours..  She is diuresed about 2-1/2 L. She still requiring 3 L of oxygen to keep saturations greater 90%, will try to wean to room air. Out of bed to chair continue daily weights, strict I's and O's. Physical therapy evaluated the patient recommended skilled nursing facility. There is a mild rise in her creatinine recheck a basic metabolic panel in the morning.  Hypervolemic hyponatremia: Sodium continues to improve nicely with diuresis.  Recheck in the morning.  Hyperkalemia: Hold potassium supplements Aldactone she was started on IV Lasix and her potassium this morning is 4.5.  Paroxysmal atrial fibrillation / Sinus node dysfunction (HCC) Rate controlled continue metoprolol not on anticoagulation.  Prolonged QTC: Likely due to pacing.  Chronic kidney disease stage IIIb: Her creatinine appears to be at baseline.  Goals of care: Consult palliative care  to address at end-of-life  Pressure injury of skin stage I sacral decubitus ulcer present on admission  RN Pressure Injury Documentation: Pressure Injury 11/14/19 Buttocks Right;Left;Medial Stage 1 -  Intact skin with non-blanchable redness of a localized area usually over a bony prominence. (Active)  11/14/19 1748  Location: Buttocks  Location Orientation: Right;Left;Medial  Staging: Stage 1 -  Intact skin with non-blanchable redness of a localized area usually over a bony prominence.  Wound Description (Comments):   Present on Admission:     Estimated body mass index is 18.88 kg/m as calculated from the following:   Height as of this encounter: 5\' 6"  (1.676 m).   Weight as of this encounter: 53.1 kg.    DVT prophylaxis: lovenox Family Communication:none Status is: Inpatient  Remains inpatient appropriate because:Hemodynamically unstable   Dispo:  Patient From: Home  Planned Disposition: ALF  Expected discharge date: 11/18/19  Medically stable for discharge: No         Code Status:     Code Status Orders  (From admission, onward)         Start     Ordered   11/14/19 1343  Do not attempt resuscitation (DNR)  Continuous       Question Answer Comment  In the event of cardiac or respiratory ARREST Do not call a "code blue"   In the event of cardiac or respiratory ARREST Do not perform Intubation, CPR, defibrillation or ACLS   In the event of cardiac or respiratory ARREST Use medication by any route, position, wound care, and other measures to relive pain and suffering. May use oxygen, suction and manual treatment of airway obstruction as needed for comfort.  11/14/19 1342        Code Status History    Date Active Date Inactive Code Status Order ID Comments User Context   11/14/2019 1313 11/14/2019 1342 Full Code [id number]  Kayleen Memos, DO ED   05/05/2011 1558 05/09/2011 1608 Full Code [id number]  Fleurizard, Adrian Prince, RN Inpatient   Advance Care Planning  Activity        IV Access:    Peripheral IV   Procedures and diagnostic studies:   DG Chest Port 1 View  Result Date: 11/14/2019 CLINICAL DATA:  Shortness of breath. EXAM: PORTABLE CHEST 1 VIEW COMPARISON:  05/07/2011 FINDINGS: The patient is rotated to the left, limiting assessment of the mediastinal structures. A dual lead pacemaker remains in place. Lung volumes are low. There is pulmonary vascular congestion with indistinctness of the vascular markings and interstitial densities throughout both lungs. No sizable pleural effusion or pneumothorax is identified. Thoracolumbar scoliosis is noted. IMPRESSION: Bilateral lung opacities favored to represent edema. Electronically Signed   By: Logan Bores M.D.   On: 11/14/2019 11:57   ECHOCARDIOGRAM COMPLETE  Result Date: 11/14/2019    ECHOCARDIOGRAM REPORT   Patient Name:   Julie Mcpherson [facility] Date of Exam: 11/14/2019 Medical Rec #:  [id number]        Height:       66.0 in Accession #:    [id number]       Weight:       124.0 lb Date of Birth:  [birth year]       BSA:          1.632 m Patient Age:    89 years        BP:           163/71 mmHg Patient Gender: F                HR:           68 bpm. Exam Location:  Inpatient Procedure: 2D Echo, Cardiac Doppler and Color Doppler Indications:    CHF  History:        Patient has prior history of Echocardiogram examinations, most                 recent 05/07/2011. CHF, Pacemaker, Arrythmias:Atrial Fibrillation;                 Risk Factors:Hypertension and Dyslipidemia.  Sonographer:    Dustin Flock Referring Phys: [id number] [place]  1. Left ventricular ejection fraction, by estimation, is 55 to 60%. The left ventricle has normal function. The left ventricle has no regional wall motion abnormalities. There is mild concentric left ventricular hypertrophy. Left ventricular diastolic parameters are consistent with Grade III diastolic dysfunction (restrictive).  2. Right ventricular systolic  function is normal. The right ventricular size is normal. There is mildly elevated pulmonary artery systolic pressure.  3. Left atrial size was mildly dilated.  4. Right atrial size was mildly dilated.  5. The pericardial effusion is anterior to the right ventricle.  6. The mitral valve is degenerative. Mild mitral valve regurgitation. Moderate mitral annular calcification.  7. The tricuspid valve is degenerative. Tricuspid valve regurgitation is moderate.  8. The aortic valve is calcified. Aortic valve regurgitation is mild.  9. The inferior vena cava is normal in size with <50% respiratory variability, suggesting right atrial pressure of 8 mmHg. FINDINGS  Left Ventricle: Left ventricular ejection fraction, by estimation, is 55 to 60%. The left ventricle has normal function. The  left ventricle has no regional wall motion abnormalities. The left ventricular internal cavity size was normal in size. There is  mild concentric left ventricular hypertrophy. Left ventricular diastolic parameters are consistent with Grade III diastolic dysfunction (restrictive). Right Ventricle: The right ventricular size is normal. No increase in right ventricular wall thickness. Right ventricular systolic function is normal. There is mildly elevated pulmonary artery systolic pressure. The tricuspid regurgitant velocity is 3.20  m/s, and with an assumed right atrial pressure of 3 mmHg, the estimated right ventricular systolic pressure is 94.8 mmHg. Left Atrium: Left atrial size was mildly dilated. Right Atrium: Right atrial size was mildly dilated. Pericardium: Trivial pericardial effusion is present. The pericardial effusion is anterior to the right ventricle. Mitral Valve: The mitral valve is degenerative in appearance. There is moderate thickening of the mitral valve leaflet(s). There is moderate calcification of the mitral valve leaflet(s). Moderate mitral annular calcification. Mild mitral valve regurgitation. Tricuspid Valve: The  tricuspid valve is degenerative in appearance. Tricuspid valve regurgitation is moderate. Aortic Valve: The aortic valve is calcified. Aortic valve regurgitation is mild. Aortic regurgitation PHT measures 680 msec. Aortic valve peak gradient measures 11.2 mmHg. Pulmonic Valve: The pulmonic valve was not well visualized. Pulmonic valve regurgitation is not visualized. Aorta: Aortic root could not be assessed. Venous: The inferior vena cava is normal in size with less than 50% respiratory variability, suggesting right atrial pressure of 8 mmHg. IAS/Shunts: The interatrial septum was not assessed. Additional Comments: A pacer wire is visualized.  LEFT VENTRICLE PLAX 2D LVIDd:         3.70 cm  Diastology LVIDs:         2.50 cm  LV e' medial:    6.42 cm/s LV PW:         1.40 cm  LV E/e' medial:  16.5 LV IVS:        1.30 cm  LV e' lateral:   5.66 cm/s LVOT diam:     2.00 cm  LV E/e' lateral: 18.7 LV SV:         88 LV SV Index:   54 LVOT Area:     3.14 cm  RIGHT VENTRICLE RV Basal diam:  4.10 cm RV S prime:     8.16 cm/s TAPSE (M-mode): 2.8 cm LEFT ATRIUM             Index       RIGHT ATRIUM           Index LA diam:        4.20 cm 2.57 cm/m  RA Area:     16.70 cm LA Vol (A2C):   44.4 ml 27.20 ml/m RA Volume:   43.20 ml  26.47 ml/m LA Vol (A4C):   49.2 ml 30.14 ml/m LA Biplane Vol: 48.6 ml 29.78 ml/m  AORTIC VALVE AV Area (Vmax): 2.37 cm AV Vmax:        167.00 cm/s AV Peak Grad:   11.2 mmHg LVOT Vmax:      126.00 cm/s LVOT Vmean:     77.300 cm/s LVOT VTI:       0.281 m AI PHT:         680 msec  AORTA Ao Root diam: 2.80 cm MITRAL VALVE                TRICUSPID VALVE MV Area (PHT): 2.99 cm     TR Peak grad:   41.0 mmHg MV Decel Time: 254 msec     TR  Vmax:        320.00 cm/s MV E velocity: 106.00 cm/s MV A velocity: 153.00 cm/s  SHUNTS MV E/A ratio:  0.69         Systemic VTI:  0.28 m                             Systemic Diam: 2.00 cm Charolette Forward MD Electronically signed by Charolette Forward MD Signature Date/Time:  11/14/2019/3:49:45 PM    Final      Medical Consultants:    None.  Anti-Infectives:   none  Subjective:    Lorien W Decker no new complaints this morning.  Objective:    Vitals:   11/16/19 0104 11/16/19 0133 11/16/19 0135 11/16/19 0426  BP: (!) 112/54   105/64  Pulse: 64 66 64 64  Resp: 19   18  Temp: 97.9 F (36.6 C)   98.1 F (36.7 C)  TempSrc: Oral   Oral  SpO2: (!) 86% 90% 93% 97%  Weight:    53.1 kg  Height:       SpO2: 97 % O2 Flow Rate (L/min): 3 L/min FiO2 (%): 36 %   Intake/Output Summary (Last 24 hours) at 11/16/2019 0654 Last data filed at 11/16/2019 0427 Gross per 24 hour  Intake 120 ml  Output 500 ml  Net -380 ml   Filed Weights   11/14/19 1123 11/15/19 0327 11/16/19 0426  Weight: 56.2 kg 54.9 kg 53.1 kg    Exam: General exam: In no acute distress. Respiratory system: Good air movement and clear to auscultation. Cardiovascular system: S1 & S2 heard, RRR. No JVD. Gastrointestinal system: Abdomen is nondistended, soft and nontender.  Extremities: No pedal edema. Skin: No rashes, lesions or ulcers   Data Reviewed:    Labs: Basic Metabolic Panel: Recent Labs  Lab 11/14/19 1142 11/14/19 1142 11/15/19 0542 11/16/19 0222  NA 131*  --  134* 133*  K 5.4*   < > 4.2 4.0  CL 96*  --  94* 92*  CO2 25  --  30 30  GLUCOSE 107*  --  89 85  BUN 37*  --  31* 37*  CREATININE 1.43*  --  1.49* 1.61*  CALCIUM 9.5  --  9.5 9.3   < > = values in this interval not displayed.   GFR Estimated Creatinine Clearance: 14.4 mL/min (A) (by C-G formula based on SCr of 1.61 mg/dL (H)). Liver Function Tests: Recent Labs  Lab 11/14/19 1142  AST 41  ALT 16  ALKPHOS 75  BILITOT 0.9  PROT 6.2*  ALBUMIN 3.0*   No results for input(s): LIPASE, AMYLASE in the last 168 hours. No results for input(s): AMMONIA in the last 168 hours. Coagulation profile No results for input(s): INR, PROTIME in the last 168 hours. COVID-19 Labs  No results for  input(s): DDIMER, FERRITIN, LDH, CRP in the last 72 hours.  Lab Results  Component Value Date   [place] NEGATIVE 11/14/2019    CBC: Recent Labs  Lab 11/14/19 1142 11/15/19 0542  WBC 7.6 7.5  NEUTROABS 5.7  --   HGB 12.4 12.2  HCT 38.6 37.7  MCV 93.9 92.6  PLT 303 258   Cardiac Enzymes: No results for input(s): CKTOTAL, CKMB, CKMBINDEX, TROPONINI in the last 168 hours. BNP (last 3 results) No results for input(s): PROBNP in the last 8760 hours. CBG: No results for input(s): GLUCAP in the last 168 hours. D-Dimer: No results for  input(s): DDIMER in the last 72 hours. Hgb A1c: No results for input(s): HGBA1C in the last 72 hours. Lipid Profile: No results for input(s): CHOL, HDL, LDLCALC, TRIG, CHOLHDL, LDLDIRECT in the last 72 hours. Thyroid function studies: Recent Labs    11/15/19 0542  TSH 1.913   Anemia work up: No results for input(s): VITAMINB12, FOLATE, FERRITIN, TIBC, IRON, RETICCTPCT in the last 72 hours. Sepsis Labs: Recent Labs  Lab 11/14/19 1142 11/15/19 0542  WBC 7.6 7.5   Microbiology Recent Results (from the past 240 hour(s))  Respiratory Panel by RT PCR (Flu A&B, Covid) - Nasopharyngeal Swab     Status: None   Collection Time: 11/14/19 11:56 AM   Specimen: Nasopharyngeal Swab  Result Value Ref Range Status   SARS Coronavirus 2 by RT PCR NEGATIVE NEGATIVE Final    Comment: (NOTE) SARS-CoV-2 target nucleic acids are NOT DETECTED.  The SARS-CoV-2 RNA is generally detectable in upper respiratoy specimens during the acute phase of infection. The lowest concentration of SARS-CoV-2 viral copies this assay can detect is 131 copies/mL. A negative result does not preclude SARS-Cov-2 infection and should not be used as the sole basis for treatment or other patient management decisions. A negative result may occur with  improper specimen collection/handling, submission of specimen other than nasopharyngeal swab, presence of viral mutation(s) within  the areas targeted by this assay, and inadequate number of viral copies (<131 copies/mL). A negative result must be combined with clinical observations, patient history, and epidemiological information. The expected result is Negative.  Fact Sheet for Patients:  PinkCheek.be  Fact Sheet for Healthcare Providers:  GravelBags.it  This test is no t yet approved or cleared by the [place] FDA and  has been authorized for detection and/or diagnosis of SARS-CoV-2 by FDA under an Emergency Use Authorization (EUA). This EUA will remain  in effect (meaning this test can be used) for the duration of the COVID-19 declaration under Section 564(b)(1) of the Act, 21 U.S.C. section 360bbb-3(b)(1), unless the authorization is terminated or revoked sooner.     Influenza A by PCR NEGATIVE NEGATIVE Final   Influenza B by PCR NEGATIVE NEGATIVE Final    Comment: (NOTE) The Xpert Xpress SARS-CoV-2/FLU/RSV assay is intended as an aid in  the diagnosis of influenza from Nasopharyngeal swab specimens and  should not be used as a sole basis for treatment. Nasal washings and  aspirates are unacceptable for Xpert Xpress SARS-CoV-2/FLU/RSV  testing.  Fact Sheet for Patients: PinkCheek.be  Fact Sheet for Healthcare Providers: GravelBags.it  This test is not yet approved or cleared by the [place] FDA and  has been authorized for detection and/or diagnosis of SARS-CoV-2 by  FDA under an Emergency Use Authorization (EUA). This EUA will remain  in effect (meaning this test can be used) for the duration of the  Covid-19 declaration under Section 564(b)(1) of the Act, 21  U.S.C. section 360bbb-3(b)(1), unless the authorization is  terminated or revoked. Performed at [facility], [place] [address] [place], [place] [address]      Medications:   . cholecalciferol  1,000 Units  Oral Daily  . enoxaparin (LOVENOX) injection  30 mg Subcutaneous Q24H  . feeding supplement (ENSURE ENLIVE)  237 mL Oral BID BM  . furosemide  20 mg Intravenous BID  . gabapentin  100 mg Oral TID  . metoprolol succinate  25 mg Oral Daily  . mirabegron ER  50 mg Oral Daily  . multivitamin with minerals  1 tablet  Oral Daily  . sodium chloride flush  3 mL Intravenous Q12H   Continuous Infusions: . sodium chloride        LOS: 2 days   Charlynne Cousins  [facility]  11/16/2019, 6:54 AM

## 2019-11-16 NOTE — Progress Notes (Signed)
Physical Therapy Treatment Patient Details Name: Julie Mcpherson MRN: [id number] DOB: 1913/04/27 Today's Date: 11/16/2019    History of Present Illness Julie Mcpherson is an 86 y.o. female past medical history significant for a sick sinus dysfunction status post pacemaker, atrial fibrillation not on anticoagulation, history of DVT not on anticoagulation, essential hypertension chronic kidney disease stage III hearing impaired comes into the ED from ALF with complaining shortness of breath for 2 weeks prior to admission decreased activity dyspnea on exertion in the ED she was found hypoxic placed on oxygen BNP greater than 500, chest x-ray showed bilateral pulmonary infiltrates potassium of 5.4 creatinine of 1.4    PT Comments    Pt in flattened bed looking straight up at the ceiling. Pt happy to see therapy and at first reluctant to get out of bed because she was afraid of falling. With much discussion able to convince pt to get up to chair and that therapy would be there to help her. Further discussion about what side of the bed she gets out of despite multiple attempts to verify. Pt attempts to get up with grippy socks however has too much posterior lean causing her feet to slip out from underneath her. Found shoes in closet and donned them pt able then to come up to squatted position and reach to recliner. Pt utilizes increased momentum to swing hips around to chair. Pt with decreased eccentric control sitting down.  PT continues to recommend SNF level rehab with ultimate return to her ALF facility. PT will continue to follow acutely.     Follow Up Recommendations  SNF;Supervision/Assistance - 24 hour     Equipment Recommendations  None recommended by PT       Precautions / Restrictions Precautions Precautions: Fall;Other (comment) Precaution Comments: low vision, monitor O2  Restrictions Weight Bearing Restrictions: No    Mobility  Bed Mobility Overal bed mobility: Needs  Assistance Bed Mobility: Supine to Sit;Sit to Supine     Supine to sit: Min guard     General bed mobility comments: with maximal effort on pt part and use of bed rails pt able to make her way to the EoB. still very confusing which side of the bed she get out on and which way she transfers to her scooter, I believe that she gets out on the L side and transfers to scooter at the head of the bed  Transfers Overall transfer level: Needs assistance Equipment used: 1 person hand held assist Transfers: Squat Pivot Transfers     Squat pivot transfers: Min assist     General transfer comment: with contact guard assist pt very independent and wants to transfer to recliner on her own, pt is able to sucessfully complete although has increase posterior lean and initially her feet were slipping out from underneath her, once she was able to get her feet underneath her she was able to squat pivot transfer to recliner on her R , decreased eccentric control with descent to chair        Balance Overall balance assessment: Needs assistance Sitting-balance support: Bilateral upper extremity supported Sitting balance-Leahy Scale: Poor Sitting balance - Comments: requires UE support to maintain balance                                    Cognition Arousal/Alertness: Awake/alert Behavior During Therapy: WFL for tasks assessed/performed Overall Cognitive Status: Within Functional Limits for tasks  assessed                                 General Comments: pt hearing deficits make it very difficult for her to command follow, and understand goals of session          General Comments General comments (skin integrity, edema, etc.): Pt on 2 L O2 via [place] with SaO2 > 92%O2       Pertinent Vitals/Pain Pain Assessment: No/denies pain           PT Goals (current goals can now be found in the care plan section) Acute Rehab PT Goals Patient Stated Goal: to have my feet stop  hurting PT Goal Formulation: With patient Time For Goal Achievement: 11/29/19 Potential to Achieve Goals: Fair Progress towards PT goals: Progressing toward goals    Frequency    Min 3X/week      PT Plan Current plan remains appropriate       AM-PAC PT "6 Clicks" Mobility   Outcome Measure  Help needed turning from your back to your side while in a flat bed without using bedrails?: A Little Help needed moving from lying on your back to sitting on the side of a flat bed without using bedrails?: A Lot Help needed moving to and from a bed to a chair (including a wheelchair)?: A Lot Help needed standing up from a chair using your arms (e.g., wheelchair or bedside chair)?: Total Help needed to walk in hospital room?: Total Help needed climbing 3-5 steps with a railing? : Total 6 Click Score: 10    End of Session Equipment Utilized During Treatment: Oxygen Activity Tolerance: Patient limited by pain;Patient limited by fatigue Patient left: in bed;with call bell/phone within reach;with bed alarm set Nurse Communication: Mobility status PT Visit Diagnosis: Muscle weakness (generalized) (M62.81);Other abnormalities of gait and mobility (R26.89)     Time: 1655-3748 PT Time Calculation (min) (ACUTE ONLY): 29 min  Charges:  $Therapeutic Activity: 8-22 mins $Self Care/Home Management: 8-22                     Lecil Tapp B. Migdalia Dk PT, DPT Acute Rehabilitation Services Pager [phone] Office [phone]    [place] 11/16/2019, 4:58 PM

## 2019-11-16 NOTE — Telephone Encounter (Signed)
Spoke with Kathie Rhodes, RN @ [facility] where pt resides to advise per Dr Caryl Comes pt  to stop Aldactone after reviewing current lab results from 11/08/2019.  Facility RN reports pt was hospitalized on 11/14/2019 with O2 sats in the 70's.  Review of pt's chart reveals hospital admission to [facility] with diagnosis of pulmonary edema.  Aldactone has already been d/c'd per pt's current MAR.  Will forward information to Dr Caryl Comes.  RN thanked Anderson Malta for update of pt's admission to hospital.

## 2019-11-16 NOTE — Progress Notes (Signed)
Daily Progress Note   Patient Name: Julie Mcpherson       Date: 11/16/2019 DOB: January 16, 1914  Age: 86 y.o. MRN#: [id number] Attending Physician: Charlynne Cousins, MD Primary Care Physician: Lajean Manes, MD Admit Date: 11/14/2019  Reason for Consultation/Follow-up: Establishing goals of care  Subjective: Patient awake, alert, oriented. In good spirits this morning.   GOC:  Discussed with RN. Patient ate breakfast this AM with assist from nursing staff. Taking oral medications with no concerns. Plan for today is attempt oxygen wean and OOB to chair.   Spoke with son, [name] yesterday. He will be out on a boat all afternoon. Provided update email to [place] as requested.   Spoke with patient at bedside. Reviewed diagnoses, interventions, plan of care. Explained recommendation by PT/OT for discharge to SNF rehab at [facility] following hospitalization. Patient's apartment is very important to her, so by son request, emphasized plan for short-term rehab and eventually transition back to her apartment if she gets stronger. Patient was receptive to this plan. She states "no other choice." Patient is willing to discharge to SNF rehab when stable for discharge.  I did confirm patient's desire for DNR/DNI code status. She states "let me go!!" when it is her time.   Answered questions. Therapeutic listening as she speaks about her two sons.   Length of Stay: 2  Current Medications: Scheduled Meds:  . cholecalciferol  1,000 Units Oral Daily  . enoxaparin (LOVENOX) injection  30 mg Subcutaneous Q24H  . feeding supplement (ENSURE ENLIVE)  237 mL Oral BID BM  . gabapentin  100 mg Oral TID  . metoprolol succinate  25 mg Oral Daily  . mirabegron ER  50 mg Oral Daily  . multivitamin with  minerals  1 tablet Oral Daily  . sodium chloride flush  3 mL Intravenous Q12H  . torsemide  10 mg Oral Daily    Continuous Infusions: . sodium chloride      PRN Meds: sodium chloride, acetaminophen, hydrALAZINE, ondansetron (ZOFRAN) IV, sodium chloride flush  Physical Exam Vitals and nursing note reviewed.  Constitutional:      General: She is awake.     Appearance: She is cachectic. She is ill-appearing.  HENT:     Head: Normocephalic and atraumatic.  Pulmonary:     Effort: No tachypnea, accessory muscle usage  or respiratory distress.  Skin:    General: Skin is warm and dry.  Neurological:     Mental Status: She is alert and oriented to person, place, and time.  Psychiatric:        Mood and Affect: Mood normal.        Speech: Speech normal.        Behavior: Behavior normal.        Cognition and Memory: Cognition normal.            Vital Signs: BP (!) 152/59 (BP Location: Right Arm)   Pulse 64   Temp 97.6 F (36.4 C) (Oral)   Resp 18   Ht 5\' 6"  (1.676 m)   Wt 53.1 kg   SpO2 95%   BMI 18.88 kg/m  SpO2: SpO2: 95 % O2 Device: O2 Device: Nasal Cannula O2 Flow Rate: O2 Flow Rate (L/min): 3 L/min  Intake/output summary:   Intake/Output Summary (Last 24 hours) at 11/16/2019 6967 Last data filed at 11/16/2019 0427 Gross per 24 hour  Intake 120 ml  Output 500 ml  Net -380 ml   LBM: Last BM Date: 11/13/19 Baseline Weight: Weight: 56.2 kg Most recent weight: Weight: 53.1 kg       Palliative Assessment/Data: PPS 40%    Flowsheet Rows     Most Recent Value  Intake Tab  Referral Department Hospitalist  Unit at Time of Referral Cardiac/Telemetry Unit  Palliative Care Primary Diagnosis Cardiac  Palliative Care Type New Palliative care  Reason for referral Clarify Goals of Care  Date first seen by Palliative Care 11/15/19  Clinical Assessment  Palliative Performance Scale Score 40%  Psychosocial & Spiritual Assessment  Palliative Care Outcomes    Patient/Family meeting held? Yes  Who was at the meeting? patient, spoke with son via telephone  Palliative Care Outcomes Clarified goals of care, Counseled regarding hospice, Provided end of life care assistance, Linked to palliative care logitudinal support, Provided psychosocial or spiritual support, ACP counseling assistance      Patient Active Problem List   Diagnosis Date Noted  . Pressure injury of skin 11/15/2019  . Acute respiratory failure with hypoxia ([place]) 11/15/2019  . Acute diastolic CHF (congestive heart failure) ([place]) 11/15/2019  . Acute pulmonary edema ([facility])   . Palliative care by specialist   . Goals of care, counseling/discussion   . A-fib ([place]) 11/02/2019  . Essential hypertension 06/28/2013  . Sinus node dysfunction ([place]) 08/22/2011  . Pacemaker-St.Jude 08/15/2011  . New onset atrial fibrillation ([place]) 05/05/2011    Palliative Care Assessment & Plan   Patient Profile: 86 y.o. female  with past medical history of sick sinus dysfunction s/p pacemaker, atrial fibrillation not on anticoagulation, DVT, essential hypertension, CKD stage III, hearing impairment admitted on 11/14/2019 with shortness of breath and hypoxia from independent living facility. Hospital admission for acute on chronic respiratory failure due to pulmonary edema and acute diastolic heart failure. Cardiology consulted and diuresing. PT/OT following and recommending SNF or 24 hours supervision/assistance. Palliative medicine consultation for goals of care.   Assessment: Acute respiratory failure with hypoxia Acute diastolic heart failure Pulmonary edema Hypervolemic hyponatremia Hyperkalemia Paroxysmal atrial fibrillation Sinus node dysfunction s/p pacemaker CKD stage IIIb  Recommendations/Plan:  Son and patient confirm DNR/DNI code status.   Continue medical management for heart failure. Treat the treatable. Watchful waiting for outcomes.   Ongoing PT/OT efforts. Patient/son agreeable  with short-term rehab at [facility] on discharge. Her apartment is very important to her so ultimately son hopeful  to get her back to apartment after rehab. TOC team notified.  Son agreeable with outpatient palliative at [facility] rehab. Briefly introduced hospice benefit and philosophy that could be of support to them in her apartment in the near future, especially if poor progression at [facility] rehab.  Code Status: DNR/DNI   Code Status Orders  (From admission, onward)         Start     Ordered   11/14/19 1343  Do not attempt resuscitation (DNR)  Continuous       Question Answer Comment  In the event of cardiac or respiratory ARREST Do not call a "code blue"   In the event of cardiac or respiratory ARREST Do not perform Intubation, CPR, defibrillation or ACLS   In the event of cardiac or respiratory ARREST Use medication by any route, position, wound care, and other measures to relive pain and suffering. May use oxygen, suction and manual treatment of airway obstruction as needed for comfort.      11/14/19 1342        Code Status History    Date Active Date Inactive Code Status Order ID Comments User Context   11/14/2019 1313 11/14/2019 1342 Full Code [id number]  Kayleen Memos, DO ED   05/05/2011 1558 05/09/2011 1608 Full Code [id number]  Fleurizard, Adrian Prince, RN Inpatient   Advance Care Planning Activity       Prognosis:   Unable to determine  Discharge Planning:  [facility] for rehab with Palliative care service follow-up  Care plan was discussed with son, patient, RN  Thank you for allowing the Palliative Medicine Team to assist in the care of this patient.   Total Time 25 Prolonged Time Billed  no       Greater than 50%  of this time was spent counseling and coordinating care related to the above assessment and plan.  Ihor Dow, DNP, FNP-C Palliative Medicine Team  Phone: [phone] Fax: [phone]  Please contact Palliative Medicine Team phone at  [phone] for questions and concerns.

## 2019-11-17 DIAGNOSIS — I4891 Unspecified atrial fibrillation: Secondary | ICD-10-CM

## 2019-11-17 LAB — BASIC METABOLIC PANEL
Anion gap: 10 (ref 5–15)
BUN: 42 mg/dL — ABNORMAL HIGH (ref 8–23)
CO2: 32 mmol/L (ref 22–32)
Calcium: 9.3 mg/dL (ref 8.9–10.3)
Chloride: 90 mmol/L — ABNORMAL LOW (ref 98–111)
Creatinine, Ser: 1.71 mg/dL — ABNORMAL HIGH (ref 0.44–1.00)
GFR, Estimated: 24 mL/min — ABNORMAL LOW (ref >60)
Glucose, Bld: 87 mg/dL (ref 70–99)
Potassium: 4.2 mmol/L (ref 3.5–5.1)
Sodium: 132 mmol/L — ABNORMAL LOW (ref 135–145)

## 2019-11-17 LAB — SARS CORONAVIRUS 2 BY RT PCR (HOSPITAL ORDER, PERFORMED IN ~~LOC~~ HOSPITAL LAB): SARS Coronavirus 2: NEGATIVE

## 2019-11-17 NOTE — Discharge Summary (Addendum)
Physician Discharge Summary  Julie Mcpherson HDQ:[id number] DOB: 11-29-1913 DOA: 11/14/2019  PCP: Lajean Manes, MD  Admit date: 11/14/2019 Discharge date: 11/17/2019  Admitted From: Home Disposition:  SNF  Recommendations for Outpatient Follow-up:  1. Follow up with PCP in 1-2 weeks 2. Please obtain BMP/CBC in one week   Home Health:No Equipment/Devices:Stable  Discharge Condition: Stable CODE STATUS:Full Diet recommendation: Heart Healthy   Brief/Interim Summary: 86 y.o. female past medical history significant for a sick sinus dysfunction status post pacemaker, atrial fibrillation not on anticoagulation, history of DVT not on anticoagulation, essential hypertension chronic kidney disease stage III hearing impaired comes into the ED from ALF with complaining shortness of breath for 2 weeks prior to admission decreased activity dyspnea on exertion in the ED she was found hypoxic placed on oxygen BNP greater than 500, chest x-ray showed bilateral pulmonary infiltrates potassium of 5.4 creatinine of 1.4  Discharge Diagnoses:  Active Problems:   New onset atrial fibrillation (HCC)   Sinus node dysfunction (HCC)   Pressure injury of skin   Acute respiratory failure with hypoxia (HCC)   Acute diastolic CHF (congestive heart failure) (HCC)   Acute pulmonary edema (HCC)   Palliative care by specialist   Goals of care, counseling/discussion  Acute respiratory failure with hypoxia secondary to acute diastolic heart failure: Chest x-ray showed pulmonary edema, with a BNP greater than 500 2D echo was done that showed a grade 2 diastolic heart failure she was started on IV Lasix diuresed about 3 L, we were able to wean her to room air. Physical therapy evaluated the patient and recommended home health PT. She will not be sent home on oral Lasix and 86 year old we could easily dehydrate her we will have to control her fluid intake.  Hypervolemic hyponatremia: Resolved with IV  diuresis.  HyperKalemia: Potassium supplement and Aldactone were held her potassium improved with diuresis.  Paroxysmal atrial fibrillation: Rate controlled continue metoprolol not on anticoagulation.  Chronic kidney disease stage IIIb: Creatinine appears to be at baseline.  Goals of care: Plan of care to follow-up at facility. Discharge Instructions  Discharge Instructions    Diet - low sodium heart healthy   Complete by: As directed    Increase activity slowly   Complete by: As directed    No wound care   Complete by: As directed      Allergies as of 11/17/2019      Reactions   Codeine Other (See Comments)   "can't sleep; I climb the walls"   Statins Other (See Comments)   "ocular migraine headaches"   Sulfa Antibiotics Anaphylaxis, Itching, Rash   Tetanus Toxoids Other (See Comments)   "think it started turning red around where I got shot; they gave me small doses and I did fine"      Medication List    STOP taking these medications   aspirin 81 MG tablet   HYDROcodone-acetaminophen 5-325 MG tablet Commonly known as: NORCO/VICODIN     TAKE these medications   acetaminophen 500 MG tablet Commonly known as: TYLENOL Take 500 mg by mouth 2 (two) times daily.   amLODipine 5 MG tablet Commonly known as: NORVASC Take 2.5 mg by mouth daily.   calcium carbonate 600 MG Tabs tablet Commonly known as: OS-CAL Take 600 mg by mouth daily with breakfast.   cholecalciferol 1000 units tablet Commonly known as: VITAMIN D Take 1,000 Units by mouth daily.   gabapentin 100 MG capsule Commonly known as: Neurontin Take 1 capsule (100 mg  total) by mouth 3 (three) times daily. What changed:   how much to take  when to take this   metoprolol succinate 25 MG 24 hr tablet Commonly known as: TOPROL-XL Take 25 mg by mouth daily.   multivitamin with minerals Tabs tablet Take 1 tablet by mouth daily.   Myrbetriq 50 MG Tb24 tablet Generic drug: mirabegron ER Take 50 mg  by mouth daily.       Allergies  Allergen Reactions  . Codeine Other (See Comments)    "can't sleep; I climb the walls"  . Statins Other (See Comments)    "ocular migraine headaches"  . Sulfa Antibiotics Anaphylaxis, Itching and Rash  . Tetanus Toxoids Other (See Comments)    "think it started turning red around where I got shot; they gave me small doses and I did fine"    Consultations:  Cardiology  Palliative care   Procedures/Studies: DG Chest Port 1 View  Result Date: 11/14/2019 CLINICAL DATA:  Shortness of breath. EXAM: PORTABLE CHEST 1 VIEW COMPARISON:  05/07/2011 FINDINGS: The patient is rotated to the left, limiting assessment of the mediastinal structures. A dual lead pacemaker remains in place. Lung volumes are low. There is pulmonary vascular congestion with indistinctness of the vascular markings and interstitial densities throughout both lungs. No sizable pleural effusion or pneumothorax is identified. Thoracolumbar scoliosis is noted. IMPRESSION: Bilateral lung opacities favored to represent edema. Electronically Signed   By: Logan Bores M.D.   On: 11/14/2019 11:57   ECHOCARDIOGRAM COMPLETE  Result Date: 11/14/2019    ECHOCARDIOGRAM REPORT   Patient Name:   Julie Mcpherson [facility] Date of Exam: 11/14/2019 Medical Rec #:  [id number]        Height:       66.0 in Accession #:    [id number]       Weight:       124.0 lb Date of Birth:  08-Feb-1913       BSA:          1.632 m Patient Age:    86 years        BP:           163/71 mmHg Patient Gender: F                HR:           68 bpm. Exam Location:  Inpatient Procedure: 2D Echo, Cardiac Doppler and Color Doppler Indications:    CHF  History:        Patient has prior history of Echocardiogram examinations, most                 recent 05/07/2011. CHF, Pacemaker, Arrythmias:Atrial Fibrillation;                 Risk Factors:Hypertension and Dyslipidemia.  Sonographer:    Dustin Flock Referring Phys: [id number] [place]  1. Left ventricular ejection fraction, by estimation, is 55 to 60%. The left ventricle has normal function. The left ventricle has no regional wall motion abnormalities. There is mild concentric left ventricular hypertrophy. Left ventricular diastolic parameters are consistent with Grade III diastolic dysfunction (restrictive).  2. Right ventricular systolic function is normal. The right ventricular size is normal. There is mildly elevated pulmonary artery systolic pressure.  3. Left atrial size was mildly dilated.  4. Right atrial size was mildly dilated.  5. The pericardial effusion is anterior to the right ventricle.  6. The mitral valve is degenerative. Mild mitral valve  regurgitation. Moderate mitral annular calcification.  7. The tricuspid valve is degenerative. Tricuspid valve regurgitation is moderate.  8. The aortic valve is calcified. Aortic valve regurgitation is mild.  9. The inferior vena cava is normal in size with <50% respiratory variability, suggesting right atrial pressure of 8 mmHg. FINDINGS  Left Ventricle: Left ventricular ejection fraction, by estimation, is 55 to 60%. The left ventricle has normal function. The left ventricle has no regional wall motion abnormalities. The left ventricular internal cavity size was normal in size. There is  mild concentric left ventricular hypertrophy. Left ventricular diastolic parameters are consistent with Grade III diastolic dysfunction (restrictive). Right Ventricle: The right ventricular size is normal. No increase in right ventricular wall thickness. Right ventricular systolic function is normal. There is mildly elevated pulmonary artery systolic pressure. The tricuspid regurgitant velocity is 3.20  m/s, and with an assumed right atrial pressure of 3 mmHg, the estimated right ventricular systolic pressure is 09.2 mmHg. Left Atrium: Left atrial size was mildly dilated. Right Atrium: Right atrial size was mildly dilated. Pericardium: Trivial  pericardial effusion is present. The pericardial effusion is anterior to the right ventricle. Mitral Valve: The mitral valve is degenerative in appearance. There is moderate thickening of the mitral valve leaflet(s). There is moderate calcification of the mitral valve leaflet(s). Moderate mitral annular calcification. Mild mitral valve regurgitation. Tricuspid Valve: The tricuspid valve is degenerative in appearance. Tricuspid valve regurgitation is moderate. Aortic Valve: The aortic valve is calcified. Aortic valve regurgitation is mild. Aortic regurgitation PHT measures 680 msec. Aortic valve peak gradient measures 11.2 mmHg. Pulmonic Valve: The pulmonic valve was not well visualized. Pulmonic valve regurgitation is not visualized. Aorta: Aortic root could not be assessed. Venous: The inferior vena cava is normal in size with less than 50% respiratory variability, suggesting right atrial pressure of 8 mmHg. IAS/Shunts: The interatrial septum was not assessed. Additional Comments: A pacer wire is visualized.  LEFT VENTRICLE PLAX 2D LVIDd:         3.70 cm  Diastology LVIDs:         2.50 cm  LV e' medial:    6.42 cm/s LV PW:         1.40 cm  LV E/e' medial:  16.5 LV IVS:        1.30 cm  LV e' lateral:   5.66 cm/s LVOT diam:     2.00 cm  LV E/e' lateral: 18.7 LV SV:         88 LV SV Index:   54 LVOT Area:     3.14 cm  RIGHT VENTRICLE RV Basal diam:  4.10 cm RV S prime:     8.16 cm/s TAPSE (M-mode): 2.8 cm LEFT ATRIUM             Index       RIGHT ATRIUM           Index LA diam:        4.20 cm 2.57 cm/m  RA Area:     16.70 cm LA Vol (A2C):   44.4 ml 27.20 ml/m RA Volume:   43.20 ml  26.47 ml/m LA Vol (A4C):   49.2 ml 30.14 ml/m LA Biplane Vol: 48.6 ml 29.78 ml/m  AORTIC VALVE AV Area (Vmax): 2.37 cm AV Vmax:        167.00 cm/s AV Peak Grad:   11.2 mmHg LVOT Vmax:      126.00 cm/s LVOT Vmean:     77.300 cm/s LVOT VTI:  0.281 m AI PHT:         680 msec  AORTA Ao Root diam: 2.80 cm MITRAL VALVE                 TRICUSPID VALVE MV Area (PHT): 2.99 cm     TR Peak grad:   41.0 mmHg MV Decel Time: 254 msec     TR Vmax:        320.00 cm/s MV E velocity: 106.00 cm/s MV A velocity: 153.00 cm/s  SHUNTS MV E/A ratio:  0.69         Systemic VTI:  0.28 m                             Systemic Diam: 2.00 cm Charolette Forward MD Electronically signed by Charolette Forward MD Signature Date/Time: 11/14/2019/3:49:45 PM    Final       Subjective: No new complaints.  Discharge Exam: Vitals:   11/17/19 0504 11/17/19 0856  BP: 128/61   Pulse: 63 64  Resp: 18   Temp: (!) 97.4 F (36.3 C) 97.6 F (36.4 C)  SpO2: 99% 94%   Vitals:   11/16/19 1619 11/16/19 2035 11/17/19 0504 11/17/19 0856  BP: 129/64 122/65 128/61   Pulse: 66 64 63 64  Resp: 18 18 18    Temp: (!) 97.5 F (36.4 C) (!) 97.5 F (36.4 C) (!) 97.4 F (36.3 C) 97.6 F (36.4 C)  TempSrc: Oral Oral Oral Axillary  SpO2: 96% 92% 99% 94%  Weight:      Height:        General: Pt is alert, awake, not in acute distress Cardiovascular: RRR, S1/S2 +, no rubs, no gallops Respiratory: CTA bilaterally, no wheezing, no rhonchi Abdominal: Soft, NT, ND, bowel sounds + Extremities: no edema, no cyanosis    The results of significant diagnostics from this hospitalization (including imaging, microbiology, ancillary and laboratory) are listed below for reference.     Microbiology: Recent Results (from the past 240 hour(s))  Respiratory Panel by RT PCR (Flu A&B, Covid) - Nasopharyngeal Swab     Status: None   Collection Time: 11/14/19 11:56 AM   Specimen: Nasopharyngeal Swab  Result Value Ref Range Status   SARS Coronavirus 2 by RT PCR NEGATIVE NEGATIVE Final    Comment: (NOTE) SARS-CoV-2 target nucleic acids are NOT DETECTED.  The SARS-CoV-2 RNA is generally detectable in upper respiratoy specimens during the acute phase of infection. The lowest concentration of SARS-CoV-2 viral copies this assay can detect is 131 copies/mL. A negative result does not  preclude SARS-Cov-2 infection and should not be used as the sole basis for treatment or other patient management decisions. A negative result may occur with  improper specimen collection/handling, submission of specimen other than nasopharyngeal swab, presence of viral mutation(s) within the areas targeted by this assay, and inadequate number of viral copies (<131 copies/mL). A negative result must be combined with clinical observations, patient history, and epidemiological information. The expected result is Negative.  Fact Sheet for Patients:  PinkCheek.be  Fact Sheet for Healthcare Providers:  GravelBags.it  This test is no t yet approved or cleared by the [place] FDA and  has been authorized for detection and/or diagnosis of SARS-CoV-2 by FDA under an Emergency Use Authorization (EUA). This EUA will remain  in effect (meaning this test can be used) for the duration of the COVID-19 declaration under Section 564(b)(1) of the Act, 21 U.S.C. section  360bbb-3(b)(1), unless the authorization is terminated or revoked sooner.     Influenza A by PCR NEGATIVE NEGATIVE Final   Influenza B by PCR NEGATIVE NEGATIVE Final    Comment: (NOTE) The Xpert Xpress SARS-CoV-2/FLU/RSV assay is intended as an aid in  the diagnosis of influenza from Nasopharyngeal swab specimens and  should not be used as a sole basis for treatment. Nasal washings and  aspirates are unacceptable for Xpert Xpress SARS-CoV-2/FLU/RSV  testing.  Fact Sheet for Patients: PinkCheek.be  Fact Sheet for Healthcare Providers: GravelBags.it  This test is not yet approved or cleared by the [place] FDA and  has been authorized for detection and/or diagnosis of SARS-CoV-2 by  FDA under an Emergency Use Authorization (EUA). This EUA will remain  in effect (meaning this test can be used) for the  duration of the  Covid-19 declaration under Section 564(b)(1) of the Act, 21  U.S.C. section 360bbb-3(b)(1), unless the authorization is  terminated or revoked. Performed at [facility], [place] [address] [place], [place] [address]      Labs: BNP (last 3 results) Recent Labs    11/14/19 1143  BNP 169.6*   Basic Metabolic Panel: Recent Labs  Lab 11/14/19 1142 11/15/19 0542 11/16/19 0222 11/17/19 0325  NA 131* 134* 133* 132*  K 5.4* 4.2 4.0 4.2  CL 96* 94* 92* 90*  CO2 25 30 30  32  GLUCOSE 107* 89 85 87  BUN 37* 31* 37* 42*  CREATININE 1.43* 1.49* 1.61* 1.71*  CALCIUM 9.5 9.5 9.3 9.3   Liver Function Tests: Recent Labs  Lab 11/14/19 1142  AST 41  ALT 16  ALKPHOS 75  BILITOT 0.9  PROT 6.2*  ALBUMIN 3.0*   No results for input(s): LIPASE, AMYLASE in the last 168 hours. No results for input(s): AMMONIA in the last 168 hours. CBC: Recent Labs  Lab 11/14/19 1142 11/15/19 0542  WBC 7.6 7.5  NEUTROABS 5.7  --   HGB 12.4 12.2  HCT 38.6 37.7  MCV 93.9 92.6  PLT 303 258   Cardiac Enzymes: No results for input(s): CKTOTAL, CKMB, CKMBINDEX, TROPONINI in the last 168 hours. BNP: Invalid input(s): POCBNP CBG: No results for input(s): GLUCAP in the last 168 hours. D-Dimer No results for input(s): DDIMER in the last 72 hours. Hgb A1c No results for input(s): HGBA1C in the last 72 hours. Lipid Profile No results for input(s): CHOL, HDL, LDLCALC, TRIG, CHOLHDL, LDLDIRECT in the last 72 hours. Thyroid function studies Recent Labs    11/15/19 0542  TSH 1.913   Anemia work up No results for input(s): VITAMINB12, FOLATE, FERRITIN, TIBC, IRON, RETICCTPCT in the last 72 hours. Urinalysis    Component Value Date/Time   COLORURINE YELLOW 11/09/2015 1036   APPEARANCEUR CLEAR 11/09/2015 1036   LABSPEC 1.014 11/09/2015 1036   PHURINE 6.5 11/09/2015 1036   GLUCOSEU NEGATIVE 11/09/2015 1036   HGBUR NEGATIVE 11/09/2015 1036   BILIRUBINUR NEGATIVE 11/09/2015  1036   KETONESUR NEGATIVE 11/09/2015 1036   PROTEINUR NEGATIVE 11/09/2015 1036   UROBILINOGEN 0.2 05/03/2011 1950   NITRITE NEGATIVE 11/09/2015 1036   LEUKOCYTESUR NEGATIVE 11/09/2015 1036   Sepsis Labs Invalid input(s): PROCALCITONIN,  WBC,  LACTICIDVEN Microbiology Recent Results (from the past 240 hour(s))  Respiratory Panel by RT PCR (Flu A&B, Covid) - Nasopharyngeal Swab     Status: None   Collection Time: 11/14/19 11:56 AM   Specimen: Nasopharyngeal Swab  Result Value Ref Range Status   SARS Coronavirus 2 by RT PCR  NEGATIVE NEGATIVE Final    Comment: (NOTE) SARS-CoV-2 target nucleic acids are NOT DETECTED.  The SARS-CoV-2 RNA is generally detectable in upper respiratoy specimens during the acute phase of infection. The lowest concentration of SARS-CoV-2 viral copies this assay can detect is 131 copies/mL. A negative result does not preclude SARS-Cov-2 infection and should not be used as the sole basis for treatment or other patient management decisions. A negative result may occur with  improper specimen collection/handling, submission of specimen other than nasopharyngeal swab, presence of viral mutation(s) within the areas targeted by this assay, and inadequate number of viral copies (<131 copies/mL). A negative result must be combined with clinical observations, patient history, and epidemiological information. The expected result is Negative.  Fact Sheet for Patients:  PinkCheek.be  Fact Sheet for Healthcare Providers:  GravelBags.it  This test is no t yet approved or cleared by the [place] FDA and  has been authorized for detection and/or diagnosis of SARS-CoV-2 by FDA under an Emergency Use Authorization (EUA). This EUA will remain  in effect (meaning this test can be used) for the duration of the COVID-19 declaration under Section 564(b)(1) of the Act, 21 U.S.C. section 360bbb-3(b)(1), unless the  authorization is terminated or revoked sooner.     Influenza A by PCR NEGATIVE NEGATIVE Final   Influenza B by PCR NEGATIVE NEGATIVE Final    Comment: (NOTE) The Xpert Xpress SARS-CoV-2/FLU/RSV assay is intended as an aid in  the diagnosis of influenza from Nasopharyngeal swab specimens and  should not be used as a sole basis for treatment. Nasal washings and  aspirates are unacceptable for Xpert Xpress SARS-CoV-2/FLU/RSV  testing.  Fact Sheet for Patients: PinkCheek.be  Fact Sheet for Healthcare Providers: GravelBags.it  This test is not yet approved or cleared by the [place] FDA and  has been authorized for detection and/or diagnosis of SARS-CoV-2 by  FDA under an Emergency Use Authorization (EUA). This EUA will remain  in effect (meaning this test can be used) for the duration of the  Covid-19 declaration under Section 564(b)(1) of the Act, 21  U.S.C. section 360bbb-3(b)(1), unless the authorization is  terminated or revoked. Performed at [facility], [place] [address], [place], [place] [address]      Time coordinating discharge: Over 40 minutes  SIGNED:   Charlynne Cousins, MD  [facility] 11/17/2019, 9:09 AM Pager   If 7PM-7AM, please contact night-coverage [email or web address] Password [email or web address]   Addendum:  Patient seen. No changes. Proceed with discharge as planned.

## 2019-11-17 NOTE — Progress Notes (Signed)
SATURATION QUALIFICATIONS: (This note is used to comply with regulatory documentation for home oxygen)  Patient Saturations on Room Air at Rest = 64%  Patient Saturations on Room Air while Ambulating = %  Patient Saturations on 3 Liters of oxygen while at rest = 92%  Please briefly explain why patient needs home oxygen: hypoxemia

## 2019-11-17 NOTE — Care Management Note (Signed)
11/17/2019 1046  IV Site assessed for patency on the left forearm.  No signs of pain or complications.  No redness, swelling, or pain.

## 2019-11-17 NOTE — Progress Notes (Signed)
CSW spoke with [facility], pt doesn't want to go to any other SNF if it's not at [facility] (they don't have any SNF beds). [place] working to find 24 hour sitters for pt as dc pt back to [facility] not a safe dc plan. CSW will continue to follow.

## 2019-11-17 NOTE — Progress Notes (Signed)
Spoke with granddaughter Colletta Maryland per phone earlier in shift.  Informed granddaughter of visitation policy, she stated she would be in for visit.  At this time, granddaughter has not been to facility.

## 2019-11-17 NOTE — Progress Notes (Signed)
Spoke with patient regarding returning back to facility.  She wants to return to her apartment and says she will not consider placement elsewhere.

## 2019-11-17 NOTE — Progress Notes (Signed)
D/C instructions given and reviewed. Tele and IV removed, tolerated well. 

## 2019-11-17 NOTE — Progress Notes (Signed)
Spoke with patient's granddaughter Colletta Maryland per phone today.  She asked that I ask patient if she would like to see her.  Patient agreed.  Granddaughter stated she will be in to visit.

## 2019-11-17 NOTE — Plan of Care (Signed)

## 2019-11-18 DIAGNOSIS — M1990 Unspecified osteoarthritis, unspecified site: Secondary | ICD-10-CM | POA: Diagnosis not present

## 2019-11-18 DIAGNOSIS — R5381 Other malaise: Secondary | ICD-10-CM | POA: Diagnosis not present

## 2019-11-18 DIAGNOSIS — I5031 Acute diastolic (congestive) heart failure: Secondary | ICD-10-CM | POA: Diagnosis not present

## 2019-11-18 DIAGNOSIS — R55 Syncope and collapse: Secondary | ICD-10-CM | POA: Diagnosis not present

## 2019-11-18 DIAGNOSIS — Z950 Presence of cardiac pacemaker: Secondary | ICD-10-CM | POA: Diagnosis not present

## 2019-11-18 DIAGNOSIS — J069 Acute upper respiratory infection, unspecified: Secondary | ICD-10-CM | POA: Diagnosis not present

## 2019-11-18 DIAGNOSIS — R3915 Urgency of urination: Secondary | ICD-10-CM | POA: Diagnosis not present

## 2019-11-18 DIAGNOSIS — I129 Hypertensive chronic kidney disease with stage 1 through stage 4 chronic kidney disease, or unspecified chronic kidney disease: Secondary | ICD-10-CM | POA: Diagnosis not present

## 2019-11-18 DIAGNOSIS — M2550 Pain in unspecified joint: Secondary | ICD-10-CM | POA: Diagnosis not present

## 2019-11-18 DIAGNOSIS — N183 Chronic kidney disease, stage 3 unspecified: Secondary | ICD-10-CM | POA: Diagnosis not present

## 2019-11-18 DIAGNOSIS — M6281 Muscle weakness (generalized): Secondary | ICD-10-CM | POA: Diagnosis not present

## 2019-11-18 DIAGNOSIS — R41841 Cognitive communication deficit: Secondary | ICD-10-CM | POA: Diagnosis not present

## 2019-11-18 DIAGNOSIS — I495 Sick sinus syndrome: Secondary | ICD-10-CM | POA: Diagnosis not present

## 2019-11-18 DIAGNOSIS — I4891 Unspecified atrial fibrillation: Secondary | ICD-10-CM | POA: Diagnosis not present

## 2019-11-18 DIAGNOSIS — E785 Hyperlipidemia, unspecified: Secondary | ICD-10-CM | POA: Diagnosis not present

## 2019-11-18 DIAGNOSIS — R2681 Unsteadiness on feet: Secondary | ICD-10-CM | POA: Diagnosis not present

## 2019-11-18 DIAGNOSIS — K219 Gastro-esophageal reflux disease without esophagitis: Secondary | ICD-10-CM | POA: Diagnosis not present

## 2019-11-18 DIAGNOSIS — J9601 Acute respiratory failure with hypoxia: Secondary | ICD-10-CM | POA: Diagnosis not present

## 2019-11-18 DIAGNOSIS — Z7401 Bed confinement status: Secondary | ICD-10-CM | POA: Diagnosis not present

## 2019-11-18 DIAGNOSIS — N3281 Overactive bladder: Secondary | ICD-10-CM | POA: Diagnosis not present

## 2019-11-18 DIAGNOSIS — J810 Acute pulmonary edema: Secondary | ICD-10-CM | POA: Diagnosis not present

## 2019-11-18 LAB — BASIC METABOLIC PANEL
Anion gap: 8 (ref 5–15)
BUN: 39 mg/dL — ABNORMAL HIGH (ref 8–23)
CO2: 36 mmol/L — ABNORMAL HIGH (ref 22–32)
Calcium: 9.6 mg/dL (ref 8.9–10.3)
Chloride: 86 mmol/L — ABNORMAL LOW (ref 98–111)
Creatinine, Ser: 1.48 mg/dL — ABNORMAL HIGH (ref 0.44–1.00)
GFR, Estimated: 28 mL/min — ABNORMAL LOW (ref >60)
Glucose, Bld: 90 mg/dL (ref 70–99)
Potassium: 4.8 mmol/L (ref 3.5–5.1)
Sodium: 130 mmol/L — ABNORMAL LOW (ref 135–145)

## 2019-11-18 NOTE — Care Management Important Message (Signed)
Important Message  Patient Details  Name: Julie Mcpherson MRN: [id number] Date of Birth: [birth year]   Medicare Important Message Given:  Yes     Shelda Altes 11/18/2019, 11:00 AM

## 2019-11-18 NOTE — [place] FL2 (Signed)
[place] MEDICAID FL2 LEVEL OF CARE SCREENING TOOL     IDENTIFICATION  Patient Name: Julie Mcpherson Birthdate: 1913/10/10 Sex: female Admission Date (Current Location): 11/14/2019  [facility] and [place] Number:  Herbalist and Address:  [facility] [place], [place] [address], [place], [place] [address]      Provider Number: [id number]  Attending Physician Name and Address:  Bonnell Public, MD  Relative Name and Phone Number:  Caragh Gasper [phone]    Current Level of Care: Hospital Recommended Level of Care: [place] Prior Approval Number:    Date Approved/Denied:   PASRR Number: [id number]  Discharge Plan: SNF    Current Diagnoses: Patient Active Problem List   Diagnosis Date Noted  . Pressure injury of skin 11/15/2019  . Acute respiratory failure with hypoxia ([place]) 11/15/2019  . Acute diastolic CHF (congestive heart failure) ([place]) 11/15/2019  . Acute pulmonary edema (HCC)   . Palliative care by specialist   . Goals of care, counseling/discussion   . A-fib ([place]) 11/02/2019  . Essential hypertension 06/28/2013  . Sinus node dysfunction ([place]) 08/22/2011  . Pacemaker-[place] 08/15/2011  . New onset atrial fibrillation ([place]) 05/05/2011    Orientation RESPIRATION BLADDER Height & Weight     Self, Time, Situation, Place  O2 ([place] 2 Liters) Incontinent, External catheter Weight: 109 lb (49.4 kg) Height:  5\' 6"  (167.6 cm)  BEHAVIORAL SYMPTOMS/MOOD NEUROLOGICAL BOWEL NUTRITION STATUS      Continent Diet (See DC summary)  AMBULATORY STATUS COMMUNICATION OF NEEDS Skin   Extensive Assist Verbally Other (Comment) (Pressure Injury 11/14/19 Right Buttocks Medial Stage 1 Intact Skin)                       Personal Care Assistance Level of Assistance  Bathing, Feeding, Dressing Bathing Assistance: Limited assistance Feeding assistance: Independent Dressing Assistance: Limited assistance     Functional  Limitations Info  Sight, Hearing, Speech Sight Info: Impaired Hearing Info: Impaired Speech Info: Adequate    SPECIAL CARE FACTORS FREQUENCY  PT (By licensed PT), OT (By licensed OT)     PT Frequency: 5x week OT Frequency: 5x week            Contractures Contractures Info: Not present    Additional Factors Info  Code Status, Allergies, Psychotropic Code Status Info: DNR Allergies Info: Codeine Statins Sulfa Antibiotics Tetanus Toxoids Psychotropic Info: gabapentin (NEURONTIN) capsule 100 mg 3X daily,         Current Medications (11/18/2019):  This is the current hospital active medication list Current Facility-Administered Medications  Medication Dose Route Frequency Provider Last Rate Last Admin  . 0.9 %  sodium chloride infusion  250 mL Intravenous PRN Irene Pap N, DO      . acetaminophen (TYLENOL) tablet 650 mg  650 mg Oral Q4H PRN Kayleen Memos, DO   650 mg at 11/16/19 2135  . cholecalciferol (VITAMIN D3) tablet 1,000 Units  1,000 Units Oral Daily Irene Pap N, DO   1,000 Units at 11/18/19 0915  . enoxaparin (LOVENOX) injection 30 mg  30 mg Subcutaneous Q24H Hall, Carole N, DO   30 mg at 11/17/19 1419  . feeding supplement (ENSURE ENLIVE) (ENSURE ENLIVE) liquid 237 mL  237 mL Oral BID BM Hall, Carole N, DO   237 mL at 11/16/19 1551  . gabapentin (NEURONTIN) capsule 100 mg  100 mg Oral TID Irene Pap N, DO   100 mg at 11/18/19 0915  .  hydrALAZINE (APRESOLINE) injection 5 mg  5 mg Intravenous Q6H PRN Irene Pap N, DO      . metoprolol succinate (TOPROL-XL) 24 hr tablet 25 mg  25 mg Oral Daily Irene Pap N, DO   25 mg at 11/18/19 0915  . mirabegron ER (MYRBETRIQ) tablet 50 mg  50 mg Oral Daily Irene Pap N, DO   50 mg at 11/18/19 9024  . multivitamin with minerals tablet 1 tablet  1 tablet Oral Daily Kayleen Memos, DO   1 tablet at 11/18/19 0916  . ondansetron (ZOFRAN) injection 4 mg  4 mg Intravenous Q6H PRN Irene Pap N, DO      . sodium chloride flush  (NS) 0.9 % injection 3 mL  3 mL Intravenous Q12H Hall, Carole N, DO   3 mL at 11/17/19 1046  . sodium chloride flush (NS) 0.9 % injection 3 mL  3 mL Intravenous PRN Irene Pap N, DO      . torsemide (DEMADEX) tablet 10 mg  10 mg Oral Daily Charlynne Cousins, MD   10 mg at 11/18/19 0973     Discharge Medications: Please see discharge summary for a list of discharge medications.  Relevant Imaging Results:  Relevant Lab Results:   Additional Information SSN [id number]  Loreta Ave, LCSWA

## 2019-11-18 NOTE — Progress Notes (Signed)
Occupational Therapy Treatment Patient Details Name: Julie Mcpherson MRN: [id number] DOB: December 13, 1913 Today's Date: 11/18/2019    History of present illness Julie Mcpherson is an 86 y.o. female past medical history significant for a sick sinus dysfunction status post pacemaker, atrial fibrillation not on anticoagulation, history of DVT not on anticoagulation, essential hypertension chronic kidney disease stage III hearing impaired comes into the ED from ALF with complaining shortness of breath for 2 weeks prior to admission decreased activity dyspnea on exertion in the ED she was found hypoxic placed on oxygen BNP greater than 500, chest x-ray showed bilateral pulmonary infiltrates potassium of 5.4 creatinine of 1.4   OT comments  Pt sat EOB x 20 min with fair balance and took meds and participated in grooming. Very pleasant. Pt declined OOB to chair, reports she is going back to [place] today and awaiting son's arrival from [place], [place]. Pt returned to supine with HOB up and soft touch call bell.   Follow Up Recommendations  SNF;Supervision/Assistance - 24 hour    Equipment Recommendations  None recommended by OT    Recommendations for Other Services      Precautions / Restrictions Precautions Precautions: Fall;Other (comment) Precaution Comments: low vision, HOH       Mobility Bed Mobility Overal bed mobility: Needs Assistance Bed Mobility: Supine to Sit;Sit to Supine     Supine to sit: Min assist Sit to supine: Min assist   General bed mobility comments: min assist with bed pad to position hips at EOB, assisted LEs back into bed  Transfers                 General transfer comment: refused OOB to chair, reports she is discharging today and awaiting her son's arrival    Balance Overall balance assessment: Needs assistance   Sitting balance-Leahy Scale: Fair Sitting balance - Comments: sat x 20 min at EOB to take meds and participate in grooming                                    ADL either performed or assessed with clinical judgement   ADL Overall ADL's : Needs assistance/impaired     Grooming: Set up;Sitting;Wash/dry hands;Wash/dry face;Brushing hair;Minimal assistance Grooming Details (indicate cue type and reason): at [employer]                                     Vision       Perception     Praxis      Cognition Arousal/Alertness: Awake/alert Behavior During Therapy: WFL for tasks assessed/performed Overall Cognitive Status: Within Functional Limits for tasks assessed                                          Exercises     Shoulder Instructions       General Comments      Pertinent Vitals/ Pain       Pain Assessment: No/denies pain  Home Living                                          Prior Functioning/Environment  Frequency  Min 2X/week        Progress Toward Goals  OT Goals(current goals can now be found in the care plan section)  Progress towards OT goals: Progressing toward goals  Acute Rehab OT Goals Patient Stated Goal: to have my feet stop hurting OT Goal Formulation: With patient Time For Goal Achievement: 11/29/19 Potential to Achieve Goals: Good  Plan Discharge plan remains appropriate    Co-evaluation                 AM-PAC OT "6 Clicks" Daily Activity     Outcome Measure   Help from another person eating meals?: A Lot Help from another person taking care of personal grooming?: A Little Help from another person toileting, which includes using toliet, bedpan, or urinal?: Total Help from another person bathing (including washing, rinsing, drying)?: A Lot Help from another person to put on and taking off regular upper body clothing?: A Lot Help from another person to put on and taking off regular lower body clothing?: Total 6 Click Score: 11    End of Session Equipment Utilized During Treatment:  Oxygen  OT Visit Diagnosis: Unsteadiness on feet (R26.81);Other abnormalities of gait and mobility (R26.89);Muscle weakness (generalized) (M62.81);Other (comment)   Activity Tolerance Patient tolerated treatment well   Patient Left in bed;with call bell/phone within reach   Nurse Communication Mobility status        Time: 2202-5427 OT Time Calculation (min): 24 min  Charges: OT General Charges $OT Visit: 1 Visit OT Treatments $Self Care/Home Management : 23-37 mins  [name], OTR/L Acute Rehabilitation Services Pager: [phone] Office: [phone]   [name] 11/18/2019, 9:41 AM

## 2019-11-18 NOTE — Progress Notes (Addendum)
Physical Therapy Treatment Patient Details Name: Julie Mcpherson MRN: [id number] DOB: 1913-11-29 Today's Date: 11/18/2019    History of Present Illness Julie Mcpherson is an 86 y.o. female past medical history significant for a sick sinus dysfunction status post pacemaker, atrial fibrillation not on anticoagulation, history of DVT not on anticoagulation, essential hypertension chronic kidney disease stage III hearing impaired comes into the ED from ALF with complaining shortness of breath for 2 weeks prior to admission decreased activity dyspnea on exertion in the ED she was found hypoxic placed on oxygen BNP greater than 500, chest x-ray showed bilateral pulmonary infiltrates potassium of 5.4 creatinine of 1.4    PT Comments    Pt supine/reclined in chair on arrival.  Focused on LE strengthening and standing trials.  She continues to require mod assistance to standing with poor balance and posterior bias.  Crepitus noted in hips and knees during standing trials.  Pt to d/c to snf today.     Follow Up Recommendations  SNF;Supervision/Assistance - 24 hour     Equipment Recommendations  None recommended by PT    Recommendations for Other Services       Precautions / Restrictions Precautions Precautions: Fall;Other (comment) Precaution Comments: low vision, HOH Restrictions Weight Bearing Restrictions: No    Mobility  Bed Mobility           General bed mobility comments: Pt seated in recliner on arrival this session.  Transfers Overall transfer level: Needs assistance Equipment used: None (Face to Face support blocking B knees.) Transfers: Sit to/from Stand Sit to Stand: Mod assist         General transfer comment: Cues for hand placement to and from seated surface this session.  Pt required blocking of B knees, assistance/facilitation of hip extension, and upper trunk control.  Pt presents with posterior bias.  Ambulation/Gait Ambulation/Gait assistance:  (NT  unable)               Stairs             Wheelchair Mobility    Modified Rankin (Stroke Patients Only)       Balance Overall balance assessment: Needs assistance Sitting-balance support: Bilateral upper extremity supported Sitting balance-Leahy Scale: Fair Sitting balance - Comments: sat x 20 min at EOB to take meds and participate in grooming     Standing balance-Leahy Scale: Poor                              Cognition Arousal/Alertness: Awake/alert Behavior During Therapy: WFL for tasks assessed/performed Overall Cognitive Status: Difficult to assess                                 General Comments: pt hearing deficits make it very difficult for her to command follow, and understand goals of session       Exercises General Exercises - Lower Extremity Ankle Circles/Pumps: Both;10 reps;Supine;AROM Heel Slides: AROM;Both;10 reps;Supine Hip ABduction/ADduction: AROM;AAROM;Both;10 reps;Supine Straight Leg Raises: AROM;AAROM;10 reps;Both;Supine    General Comments        Pertinent Vitals/Pain Pain Assessment: No/denies pain Faces Pain Scale: Hurts little more Pain Location: B feet Pain Descriptors / Indicators: Grimacing;Guarding Pain Intervention(s): Monitored during session;Repositioned    Home Living                      Prior Function  PT Goals (current goals can now be found in the care plan section) Acute Rehab PT Goals Patient Stated Goal: to have my feet stop hurting Potential to Achieve Goals: Fair Progress towards PT goals: Progressing toward goals    Frequency    Min 3X/week      PT Plan Current plan remains appropriate    Co-evaluation              AM-PAC PT "6 Clicks" Mobility   Outcome Measure  Help needed turning from your back to your side while in a flat bed without using bedrails?: A Little Help needed moving from lying on your back to sitting on the side of a flat  bed without using bedrails?: A Lot Help needed moving to and from a bed to a chair (including a wheelchair)?: A Lot Help needed standing up from a chair using your arms (e.g., wheelchair or bedside chair)?: A Lot Help needed to walk in hospital room?: Total Help needed climbing 3-5 steps with a railing? : Total 6 Click Score: 11    End of Session Equipment Utilized During Treatment: Oxygen Activity Tolerance: Patient limited by pain;Patient limited by fatigue Patient left: in bed;with call bell/phone within reach;with bed alarm set Nurse Communication: Mobility status PT Visit Diagnosis: Muscle weakness (generalized) (M62.81);Other abnormalities of gait and mobility (R26.89)     Time: 1130-1145 PT Time Calculation (min) (ACUTE ONLY): 15 min  Charges:  $Therapeutic Exercise: 8-22 mins                     Erasmo Leventhal , PTA Acute Rehabilitation Services Pager [phone] Office [phone]     Julie Mcpherson [name] 11/18/2019, 11:52 AM

## 2019-11-18 NOTE — Progress Notes (Signed)
CSW working to find SNF placement for pt and family is working to find Haematologist for pt. Offers sent out, CSW called [facility], not taking outside residents at this time. [facility] doesn't have any beds available. CSW sent referral to [facility], waiting on response. CSW sent referral to [facility] and [facility], waiting on response.

## 2019-11-18 NOTE — TOC Transition Note (Signed)
Transition of Care [facility]) - CM/SW Discharge Note   Patient Details  Name: Julie Mcpherson MRN: [id number] Date of Birth: [birth year]  [facility] CM/SW Contact:  Julie Mcpherson, [place] Phone Number: 11/18/2019, 11:11 AM   Clinical Narrative:    Patient will DC to: [facility] Anticipated DC date:  11/18/19 Family notified: Julie Mcpherson Transport by: Julie Mcpherson   Per MD patient ready for DC to Julie Mcpherson. RN to call report prior to discharge [phone]. RN, patient, patient's family, and facility notified of DC. Discharge Summary and FL2 sent to facility. DC packet on chart. Ambulance transport requested for patient.   CSW will sign off for now as social work intervention is no longer needed. Please consult [place] again if new needs arise.     Final next level of care: Skilled Nursing Facility Barriers to Discharge: Barriers Resolved   Patient Goals and CMS Choice Patient states their goals for this hospitalization and ongoing recovery are:: Get back home CMS Medicare.gov Compare Post Acute Care list provided to:: Patient Choice offered to / list presented to : Patient, Adult Children  Discharge Placement PASRR number recieved: 05/06/11            Patient chooses bed at: Julie Mcpherson Patient to be transferred to facility by: [place] Name of family member notified: Julie Mcpherson Patient and family notified of of transfer: 11/18/19  Discharge Plan and Services                                     Social Determinants of Health (SDOH) Interventions     Readmission Risk Interventions No flowsheet data found.

## 2019-11-21 ENCOUNTER — Encounter: Payer: Self-pay | Admitting: Internal Medicine

## 2019-11-21 ENCOUNTER — Other Ambulatory Visit: Payer: Self-pay | Admitting: *Deleted

## 2019-11-21 NOTE — Patient Outreach (Signed)
Member screened for potential [facility] Care Management needs as a benefit of [employer].  Per Patient Ping member resides in [facility].   Communication sent to [facility] SW to collaborate about anticipated dc plans and potential [facility] Care Management needs.  Will continue to follow while member resides in [facility].    Simmone Cape, MSN-Ed, RN,BSN [facility] Post Acute Care Coordinator [phone] ( [employer]) [phone]  (Toll free office)  

## 2019-11-22 ENCOUNTER — Encounter: Payer: Self-pay | Admitting: Internal Medicine

## 2019-11-22 ENCOUNTER — Non-Acute Institutional Stay (INDEPENDENT_AMBULATORY_CARE_PROVIDER_SITE_OTHER): Payer: Self-pay | Admitting: Internal Medicine

## 2019-11-22 DIAGNOSIS — I10 Essential (primary) hypertension: Secondary | ICD-10-CM

## 2019-11-22 DIAGNOSIS — Z950 Presence of cardiac pacemaker: Secondary | ICD-10-CM

## 2019-11-22 DIAGNOSIS — I480 Paroxysmal atrial fibrillation: Secondary | ICD-10-CM

## 2019-11-22 DIAGNOSIS — L89311 Pressure ulcer of right buttock, stage 1: Secondary | ICD-10-CM

## 2019-11-22 DIAGNOSIS — I5031 Acute diastolic (congestive) heart failure: Secondary | ICD-10-CM

## 2019-11-22 DIAGNOSIS — J810 Acute pulmonary edema: Secondary | ICD-10-CM

## 2019-11-22 DIAGNOSIS — I495 Sick sinus syndrome: Secondary | ICD-10-CM

## 2019-11-22 DIAGNOSIS — J9601 Acute respiratory failure with hypoxia: Secondary | ICD-10-CM

## 2019-11-22 DIAGNOSIS — N1832 Chronic kidney disease, stage 3b: Secondary | ICD-10-CM

## 2019-11-22 DIAGNOSIS — E871 Hypo-osmolality and hyponatremia: Secondary | ICD-10-CM

## 2019-11-22 DIAGNOSIS — D509 Iron deficiency anemia, unspecified: Secondary | ICD-10-CM

## 2019-11-22 DIAGNOSIS — Z66 Do not resuscitate: Secondary | ICD-10-CM

## 2019-11-22 NOTE — Progress Notes (Signed)
Provider:  Rexene Edison. Mariea Clonts, D.O., C.M.D. Location:  [place] Room Number: 341-D Place of Service:  SNF (31)  PCP: Lajean Manes, MD Patient Care Team: Lajean Manes, MD as PCP - General (Internal Medicine)  Extended Emergency Contact Information Primary Emergency Contact: Kolander,Clarence E Address: [place], [place] [address] [place] Phone: [phone] Mobile Phone: [phone] Relation: Son Secondary Emergency Contact: [facility] Address: [place], [place] [address] [place] Phone: [phone] Mobile Phone: [phone] Relation: Relative  Code Status: DNR Goals of Care: Advanced Directive information Advanced Directives 11/21/2019  Does Patient Have a Medical Advance Directive? Yes  Type of Advance Directive Out of facility DNR (pink MOST or yellow form)  Does patient want to make changes to medical advance directive? No - Patient declined  Copy of [place] in Chart? -  Pre-existing out of facility DNR order (yellow form or pink MOST form) -   Chief Complaint  Patient presents with  . New Admit To SNF    New admission to [facility] SNF    HPI: Patient is a 86 y.o. female seen today for admission to [facility] and rehab status post hospitalization from October 11 to October 15 at [facility].  She has a past medical history significant for atrial fibrillation not on oral anticoagulation, history of DVT, hypertension, sinus node dysfunction with a Saint Jude pacemaker since 2013, chronic kidney disease stage III.  She does have impairment of both her vision and her hearing.  She has overactive bladder.  She had presented to the emergency department from her assisted living facility (appears this was [facility]) with complaints of gradually worsening shortness of breath for 1 to 2 weeks, decrease in activity  and dyspnea that was markedly worse the day of her presentation.  She denied chest pain, cough, or lower extremity edema.  In the emergency department she was found to be hypoxic with a sat of 81% on room air that improved to 98 on 4 L via nasal cannula.  She did have jugular venous distention.  BNP was greater than 500, serum sodium was 131, potassium 5.4, creatinine 1.43 with a baseline 1.3 and her chest x-ray was consistent with pulmonary edema.  She underwent a 2D echocardiogram and cardiology was consulted.  Diuresis was initiated with strict I&O's and daily weights along with telemetry monitoring.  Incentive spirometry and flutter valve were started.  Oxygen was weaned as tolerated.  The echocardiogram showed grade 2 diastolic heart failure.  About 3 L of fluid were diuresed and she was able to be returned to room air.  Due to her advanced age there were concerns about easy dehydration so she was not sent with oral Lasix but p.o. intake monitoring was recommended.    For hyponatremia volume status was monitored and repeat sodium obtained.  This turned out to be hypervolemic and resolved with diuresis  For her hyperkalemia diuresis was continued with follow-up labs.  Her potassium supplement and Aldactone were held and her potassium improved with diuresis.  For her paroxysmal atrial fibrillation, her heart rate was currently controlled with an EKG sinus rate of 71 on home metoprolol was continued.    She did have a prolonged QTc interval over 500 so I will QTC prolonging agents were withheld.  CKD 3B: Baseline creatinine appears to be 1.3 with a GFR of 31 nephrotoxins and hypotension were avoided and urine output monitored with repeat labs with reported improvement.  She is no longer taking any PPI for her GERD.  Iron deficiency anemia was stable with a hemoglobin of 12.4 and she is no longer taking iron.  As of October 11, hospital record indicated she had a stage I pressure injury to her  right medial buttocks.  Palliative care was consulted--notes indicate her goal is to return back to her apt at [facility] when discussed with her son, [name], after she gets strong enough with rehab here.  She said she wanted to be let go if she had a cardiopulmonary arrest.  PPS was 40% at the time of the consult 10/13.  She is to be followed by palliative care here.  She had her [place] vaccines January 20 and February 10.  Her hearing loss is severe.  She also cannot see well enough to feed herself--needs assistance with this.  She reports that in her [place] apt, she used a scooter for everything and her goal is to get back there.  She would certainly need considerable caregiver assistance, as well due to not only her physical but her sensory needs.  The hospital recommended for palliative care to follow her here.   Past Medical History:  Diagnosis Date  . Arthritis   . Atrial fibrillation ([place])   . Blood transfusion   . Chronic kidney disease    stage 3 renal disease  . Diverticular disease   . DJD (degenerative joint disease)   . DJD (degenerative joint disease)   . DVT (deep venous thrombosis) ([place]) early 1980's   left medial upper thigh  . Fracture, shoulder ~ 2003; 1997   left; right  . GERD (gastroesophageal reflux disease)   . High cholesterol   . High cholesterol   . Hypertension   . Overactive bladder   . Pacemaker   . Skin cancer of face   . Skin cancer of face   . Syncope secondary to post termination pauses 05/03/11   "passed out; first time ever"  . Urinary frequency    Past Surgical History:  Procedure Laterality Date  . APPENDECTOMY  ~ 1938  . BLADDER SUSPENSION  1985  . CARPAL TUNNEL RELEASE  04/18/2011   right; Procedure: CARPAL TUNNEL RELEASE;  Surgeon: Cammie Sickle., MD;  Location: [place];  Service: Orthopedics;  Laterality: Right;  . CARPAL TUNNEL RELEASE  12/05/2011   Procedure: CARPAL TUNNEL RELEASE;  Surgeon: Cammie Sickle., MD;  Location: [place];  Service: Orthopedics;  Laterality: Left;  . CATARACT EXTRACTION W/ INTRAOCULAR LENS  IMPLANT, BILATERAL    . HERNIA REPAIR  1986   "stomach"  . JOINT REPLACEMENT     hips  . PERMANENT PACEMAKER INSERTION N/A 05/06/2011   Procedure: PERMANENT PACEMAKER INSERTION;  Surgeon: Deboraha Sprang, MD;  Location: [facility] CATH LAB;  Service: Cardiovascular;  Laterality: N/A;  . SKIN CANCER EXCISION  2012   "twice"  . TEMPORARY PACEMAKER INSERTION N/A 05/05/2011   Procedure: TEMPORARY PACEMAKER INSERTION;  Surgeon: Clent Demark, MD;  Location: [place] CATH LAB;  Service: Cardiovascular;  Laterality: N/A;  . TOTAL HIP ARTHROPLASTY     "2 on right; 1 on left"    Social History   Socioeconomic History  . Marital status: Widowed    Spouse name: Not on file  .  Number of children: Not on file  . Years of education: Not on file  . Highest education level: Not on file  Occupational History  . Not on file  Tobacco Use  . Smoking status: Never Smoker  . Smokeless tobacco: Never Used  Vaping Use  . Vaping Use: Never used  Substance and Sexual Activity  . Alcohol use: Yes    Comment: 05/05/11 "thimble full maybe twice a year"  . Drug use: No  . Sexual activity: Never  Other Topics Concern  . Not on file  Social History Narrative  . Not on file   Social Determinants of Health   Financial Resource Strain:   . Difficulty of Paying Living Expenses: Not on file  Food Insecurity:   . Worried About Charity fundraiser in the Last Year: Not on file  . Ran Out of Food in the Last Year: Not on file  Transportation Needs:   . Lack of Transportation (Medical): Not on file  . Lack of Transportation (Non-Medical): Not on file  Physical Activity:   . Days of Exercise per Week: Not on file  . Minutes of Exercise per Session: Not on file  Stress:   . Feeling of Stress : Not on file  Social Connections:   . Frequency of Communication with Friends and Family:  Not on file  . Frequency of Social Gatherings with Friends and Family: Not on file  . Attends Religious Services: Not on file  . Active Member of Clubs or Organizations: Not on file  . Attends Archivist Meetings: Not on file  . Marital Status: Not on file    reports that she has never smoked. She has never used smokeless tobacco. She reports current alcohol use. She reports that she does not use drugs.  Functional Status Survey:    Family History  Family history unknown: Yes    Health Maintenance  Topic Date Due  . TETANUS/TDAP  Never done  . DEXA SCAN  Never done  . PNA vac Low Risk Adult (1 of 2 - PCV13) Never done  . INFLUENZA VACCINE  09/04/2019  . COVID-19 Vaccine  Completed    Allergies  Allergen Reactions  . Codeine Other (See Comments)    "can't sleep; I climb the walls"  . Statins Other (See Comments)    "ocular migraine headaches"  . Sulfa Antibiotics Anaphylaxis, Itching and Rash  . Tetanus Toxoids Other (See Comments)    "think it started turning red around where I got shot; they gave me small doses and I did fine"    Outpatient Encounter Medications as of 11/22/2019  Medication Sig  . acetaminophen (TYLENOL) 500 MG tablet Take 500 mg by mouth 2 (two) times daily.  [name] amLODipine (NORVASC) 5 MG tablet Take 2.5 mg by mouth daily.   . calcium carbonate (OS-CAL) 600 MG TABS Take 600 mg by mouth daily with breakfast.   . cholecalciferol (VITAMIN D) 1000 UNITS tablet Take 1,000 Units by mouth daily.  [name] gabapentin (NEURONTIN) 100 MG capsule Take 1 capsule (100 mg total) by mouth 3 (three) times daily.  . metoprolol succinate (TOPROL-XL) 25 MG 24 hr tablet Take 25 mg by mouth daily.  . Multiple Vitamin (MULTIVITAMIN WITH MINERALS) TABS Take 1 tablet by mouth daily.  [name] MYRBETRIQ 50 MG TB24 tablet Take 50 mg by mouth daily.    No facility-administered encounter medications on file as of 11/22/2019.    Review of Systems  Constitutional: Positive for  malaise/fatigue and weight loss. Negative for chills and fever.  HENT: Positive for hearing loss. Negative for congestion and sore throat.   Eyes: Positive for blurred vision.  Respiratory: Negative for cough and shortness of breath.   Cardiovascular: Negative for chest pain, palpitations and leg swelling.  Gastrointestinal: Negative for abdominal pain and constipation.  Genitourinary: Negative for dysuria.  Musculoskeletal: Negative for falls and joint pain.  Skin: Negative for itching and rash.  Neurological: Negative for dizziness and loss of consciousness.  Endo/Heme/Allergies: Bruises/bleeds easily.  Psychiatric/Behavioral: Negative for depression. The patient is not nervous/anxious and does not have insomnia.     Vitals:   11/21/19 1446  BP: 106/62  Pulse: 66  Resp: 18  Temp: (!) 96.2 F (35.7 C)  TempSrc: Oral  SpO2: 95%  Weight: 109 lb (49.4 kg)  Height: 5\' 6"  (1.676 m)   Body mass index is 17.59 kg/m. Physical Exam Vitals reviewed.  Constitutional:      General: She is not in acute distress.    Appearance: Normal appearance. She is not ill-appearing or toxic-appearing.     Comments: increasingly frail 86 yo  HENT:     Head: Normocephalic and atraumatic.     Right Ear: External ear normal.     Left Ear: External ear normal.     Ears:     Comments: Hearing aids that don't stay in well, HOH    Nose: Nose normal.     Mouth/Throat:     Pharynx: Oropharynx is clear.  Eyes:     Pupils: Pupils are equal, round, and reactive to light.     Comments: Left eye smaller; right eye with loss of vision, left is "the good eye"  Cardiovascular:     Rate and Rhythm: Rhythm irregular.     Heart sounds: Normal heart sounds.  Pulmonary:     Effort: Pulmonary effort is normal.     Breath sounds: Normal breath sounds. No rales.  Abdominal:     General: Bowel sounds are normal. There is no distension.     Palpations: Abdomen is soft.     Tenderness: There is no abdominal  tenderness. There is no guarding or rebound.  Musculoskeletal:        General: Normal range of motion.     Cervical back: Neck supple.     Right lower leg: No edema.     Left lower leg: No edema.  Lymphadenopathy:     Cervical: No cervical adenopathy.  Skin:    General: Skin is warm and dry.     Comments: Stage 1 areas on buttocks and heels--now with foam dressings for protection  Neurological:     General: No focal deficit present.     Mental Status: She is alert and oriented to person, place, and time.     Cranial Nerves: No cranial nerve deficit.     Motor: Weakness present.     Gait: Gait abnormal.     Comments: Resting in bed when seen, looks exhausted  Psychiatric:        Mood and Affect: Mood normal.        Behavior: Behavior normal.     Labs reviewed: Basic Metabolic Panel: Recent Labs    11/16/19 0222 11/17/19 0325 11/18/19 0354  NA 133* 132* 130*  K 4.0 4.2 4.8  CL 92* 90* 86*  CO2 30 32 36*  GLUCOSE 85 87 90  BUN 37* 42* 39*  CREATININE 1.61* 1.71* 1.48*  CALCIUM 9.3 9.3 9.6  Liver Function Tests: Recent Labs    11/14/19 1142  AST 41  ALT 16  ALKPHOS 75  BILITOT 0.9  PROT 6.2*  ALBUMIN 3.0*   No results for input(s): LIPASE, AMYLASE in the last 8760 hours. No results for input(s): AMMONIA in the last 8760 hours. CBC: Recent Labs    11/14/19 1142 11/15/19 0542  WBC 7.6 7.5  NEUTROABS 5.7  --   HGB 12.4 12.2  HCT 38.6 37.7  MCV 93.9 92.6  PLT 303 258   Cardiac Enzymes: No results for input(s): CKTOTAL, CKMB, CKMBINDEX, TROPONINI in the last 8760 hours. BNP: Invalid input(s): POCBNP No results found for: HGBA1C Lab Results  Component Value Date   TSH 1.913 11/15/2019   No results found for: VITAMINB12 No results found for: FOLATE No results found for: IRON, TIBC, FERRITIN  Imaging and Procedures obtained prior to SNF admission: DG Chest Port 1 View  Result Date: 11/14/2019 CLINICAL DATA:  Shortness of breath. EXAM: PORTABLE  CHEST 1 VIEW COMPARISON:  05/07/2011 FINDINGS: The patient is rotated to the left, limiting assessment of the mediastinal structures. A dual lead pacemaker remains in place. Lung volumes are low. There is pulmonary vascular congestion with indistinctness of the vascular markings and interstitial densities throughout both lungs. No sizable pleural effusion or pneumothorax is identified. Thoracolumbar scoliosis is noted. IMPRESSION: Bilateral lung opacities favored to represent edema. Electronically Signed   By: Logan Bores M.D.   On: 11/14/2019 11:57   ECHOCARDIOGRAM COMPLETE  Result Date: 11/14/2019    ECHOCARDIOGRAM REPORT   Patient Name:   RAGINA FENTER [facility] Date of Exam: 11/14/2019 Medical Rec #:  [id number]        Height:       66.0 in Accession #:    [id number]       Weight:       124.0 lb Date of Birth:  [birth year]       BSA:          1.632 m Patient Age:    89 years        BP:           163/71 mmHg Patient Gender: F                HR:           68 bpm. Exam Location:  Inpatient Procedure: 2D Echo, Cardiac Doppler and Color Doppler Indications:    CHF  History:        Patient has prior history of Echocardiogram examinations, most                 recent 05/07/2011. CHF, Pacemaker, Arrythmias:Atrial Fibrillation;                 Risk Factors:Hypertension and Dyslipidemia.  Sonographer:    Dustin Flock Referring Phys: [id number] [place]  1. Left ventricular ejection fraction, by estimation, is 55 to 60%. The left ventricle has normal function. The left ventricle has no regional wall motion abnormalities. There is mild concentric left ventricular hypertrophy. Left ventricular diastolic parameters are consistent with Grade III diastolic dysfunction (restrictive).  2. Right ventricular systolic function is normal. The right ventricular size is normal. There is mildly elevated pulmonary artery systolic pressure.  3. Left atrial size was mildly dilated.  4. Right atrial size was mildly dilated.   5. The pericardial effusion is anterior to the right ventricle.  6. The mitral valve is degenerative. Mild mitral valve regurgitation. Moderate mitral  annular calcification.  7. The tricuspid valve is degenerative. Tricuspid valve regurgitation is moderate.  8. The aortic valve is calcified. Aortic valve regurgitation is mild.  9. The inferior vena cava is normal in size with <50% respiratory variability, suggesting right atrial pressure of 8 mmHg. FINDINGS  Left Ventricle: Left ventricular ejection fraction, by estimation, is 55 to 60%. The left ventricle has normal function. The left ventricle has no regional wall motion abnormalities. The left ventricular internal cavity size was normal in size. There is  mild concentric left ventricular hypertrophy. Left ventricular diastolic parameters are consistent with Grade III diastolic dysfunction (restrictive). Right Ventricle: The right ventricular size is normal. No increase in right ventricular wall thickness. Right ventricular systolic function is normal. There is mildly elevated pulmonary artery systolic pressure. The tricuspid regurgitant velocity is 3.20  m/s, and with an assumed right atrial pressure of 3 mmHg, the estimated right ventricular systolic pressure is 37.0 mmHg. Left Atrium: Left atrial size was mildly dilated. Right Atrium: Right atrial size was mildly dilated. Pericardium: Trivial pericardial effusion is present. The pericardial effusion is anterior to the right ventricle. Mitral Valve: The mitral valve is degenerative in appearance. There is moderate thickening of the mitral valve leaflet(s). There is moderate calcification of the mitral valve leaflet(s). Moderate mitral annular calcification. Mild mitral valve regurgitation. Tricuspid Valve: The tricuspid valve is degenerative in appearance. Tricuspid valve regurgitation is moderate. Aortic Valve: The aortic valve is calcified. Aortic valve regurgitation is mild. Aortic regurgitation PHT measures  680 msec. Aortic valve peak gradient measures 11.2 mmHg. Pulmonic Valve: The pulmonic valve was not well visualized. Pulmonic valve regurgitation is not visualized. Aorta: Aortic root could not be assessed. Venous: The inferior vena cava is normal in size with less than 50% respiratory variability, suggesting right atrial pressure of 8 mmHg. IAS/Shunts: The interatrial septum was not assessed. Additional Comments: A pacer wire is visualized.  LEFT VENTRICLE PLAX 2D LVIDd:         3.70 cm  Diastology LVIDs:         2.50 cm  LV e' medial:    6.42 cm/s LV PW:         1.40 cm  LV E/e' medial:  16.5 LV IVS:        1.30 cm  LV e' lateral:   5.66 cm/s LVOT diam:     2.00 cm  LV E/e' lateral: 18.7 LV SV:         88 LV SV Index:   54 LVOT Area:     3.14 cm  RIGHT VENTRICLE RV Basal diam:  4.10 cm RV S prime:     8.16 cm/s TAPSE (M-mode): 2.8 cm LEFT ATRIUM             Index       RIGHT ATRIUM           Index LA diam:        4.20 cm 2.57 cm/m  RA Area:     16.70 cm LA Vol (A2C):   44.4 ml 27.20 ml/m RA Volume:   43.20 ml  26.47 ml/m LA Vol (A4C):   49.2 ml 30.14 ml/m LA Biplane Vol: 48.6 ml 29.78 ml/m  AORTIC VALVE AV Area (Vmax): 2.37 cm AV Vmax:        167.00 cm/s AV Peak Grad:   11.2 mmHg LVOT Vmax:      126.00 cm/s LVOT Vmean:     77.300 cm/s LVOT VTI:  0.281 m AI PHT:         680 msec  AORTA Ao Root diam: 2.80 cm MITRAL VALVE                TRICUSPID VALVE MV Area (PHT): 2.99 cm     TR Peak grad:   41.0 mmHg MV Decel Time: 254 msec     TR Vmax:        320.00 cm/s MV E velocity: 106.00 cm/s MV A velocity: 153.00 cm/s  SHUNTS MV E/A ratio:  0.69         Systemic VTI:  0.28 m                             Systemic Diam: 2.00 cm Charolette Forward MD Electronically signed by Charolette Forward MD Signature Date/Time: 11/14/2019/3:49:45 PM    Final     Assessment/Plan 1. Acute diastolic CHF (congestive heart failure) ([place]) -resolved with diuresis, but not put on regular lasix so needs routine weight monitoring per chf  protocol and keeping an eye out for sob, increased sacral or LE edema -f/u bmp  2. Acute pulmonary edema (H[facility]) -resolved with diuresis  3. Acute respiratory failure with hypoxia ([facility]) -resolved with diuresis for acute chf  4. Paroxysmal atrial fibrillation ([facility]CC) -cont toprol xl for rate control, not on anticoagulation at 89  5. Essential hypertension -bp at goal, cont toprol xl and low dose amlodipine, monitor for orthostasis after diuresis  6. Sinus node dysfunction (HCC) -reason for pacemaker, stable  7. Pacemaker-St.Jude -noted  8. Pressure injury of right buttock, stage 1 -now using cushioned dressing, cont regular pericare, frequent repositioning  9. Iron deficiency anemia, unspecified iron deficiency anemia type -f/u cbc at one week, monitor  10. Dilutional hyponatremia -follow weight and recheck bmp  11. Chronic kidney disease, stage 3b (HCC) -Avoid nephrotoxic agents like nsaids, dose adjust renally excreted meds, hydrate.  12. DNR (do not resuscitate) - Do not attempt resuscitation (DNR)  Family/ staff Communication: d/w snf nurse  Labs/tests ordered:  Cbc, bmp at one week (f/u electrolytes), palliative care to follow  Kirill Chatterjee L. Rajendra Spiller, D.O. [facility] [address], [place] [address] Cell Phone (Mon-Fri 8am-5pm):  [phone] On Call:  [phone] & follow prompts after 5pm & weekends Office Phone:  [phone] Office Fax:  [phone]

## 2019-11-24 ENCOUNTER — Encounter: Payer: Self-pay | Admitting: Family

## 2019-11-24 ENCOUNTER — Non-Acute Institutional Stay (INDEPENDENT_AMBULATORY_CARE_PROVIDER_SITE_OTHER): Payer: Self-pay | Admitting: Family

## 2019-11-24 DIAGNOSIS — M170 Bilateral primary osteoarthritis of knee: Secondary | ICD-10-CM

## 2019-11-24 DIAGNOSIS — I10 Essential (primary) hypertension: Secondary | ICD-10-CM

## 2019-11-24 DIAGNOSIS — J9611 Chronic respiratory failure with hypoxia: Secondary | ICD-10-CM

## 2019-11-24 DIAGNOSIS — N3281 Overactive bladder: Secondary | ICD-10-CM

## 2019-11-24 DIAGNOSIS — I495 Sick sinus syndrome: Secondary | ICD-10-CM

## 2019-11-24 DIAGNOSIS — M1990 Unspecified osteoarthritis, unspecified site: Secondary | ICD-10-CM | POA: Insufficient documentation

## 2019-11-24 DIAGNOSIS — I5032 Chronic diastolic (congestive) heart failure: Secondary | ICD-10-CM

## 2019-11-24 DIAGNOSIS — N1832 Chronic kidney disease, stage 3b: Secondary | ICD-10-CM

## 2019-11-24 DIAGNOSIS — D509 Iron deficiency anemia, unspecified: Secondary | ICD-10-CM

## 2019-11-24 DIAGNOSIS — I480 Paroxysmal atrial fibrillation: Secondary | ICD-10-CM

## 2019-11-24 MED ORDER — AMLODIPINE BESYLATE 5 MG PO TABS: 2.5000 mg | ORAL_TABLET | Freq: Every day | ORAL | 0 refills | Status: DC

## 2019-11-24 MED ORDER — METOPROLOL SUCCINATE ER 25 MG PO TB24: 25.0000 mg | ORAL_TABLET | Freq: Every day | ORAL | 0 refills | Status: AC

## 2019-11-24 MED ORDER — GABAPENTIN 100 MG PO CAPS: 100.0000 mg | ORAL_CAPSULE | Freq: Three times a day (TID) | ORAL | 0 refills | Status: AC

## 2019-11-24 MED ORDER — MYRBETRIQ 50 MG PO TB24: 50.0000 mg | ORAL_TABLET | Freq: Every day | ORAL | 0 refills | Status: AC

## 2019-11-24 NOTE — Progress Notes (Signed)
Location:  [place] Room Number: 660-Y Place of Service:  SNF (31)  Provider: Arbadella Kimbler FNP-C   PCP: Lajean Manes, MD Patient Care Team: Lajean Manes, MD as PCP - General (Internal Medicine)  Extended Emergency Contact Information Primary Emergency Contact: Grace,Clarence E Address: [place], [place] [address] [place] Phone: [phone] Mobile Phone: [phone] Relation: Son Secondary Emergency Contact: [facility] Address: [place],  [address] [place] Phone: [phone] Mobile Phone: [phone] Relation: Relative  Code Status: DNR Goals of care:  Advanced Directive information Advanced Directives 11/24/2019  Does Patient Have a Medical Advance Directive? Yes  Type of Paramedic of [place];Living will;Out of facility DNR (pink MOST or yellow form)  Does patient want to make changes to medical advance directive? No - Patient declined  Copy of [place] in Chart? Yes - validated most recent copy scanned in chart (See row information)  Pre-existing out of facility DNR order (yellow form or pink MOST form) -     Allergies  Allergen Reactions  . Codeine Other (See Comments)    "can't sleep; I climb the walls"  . Statins Other (See Comments)    "ocular migraine headaches"  . Sulfa Antibiotics Anaphylaxis, Itching and Rash  . Tetanus Toxoids Other (See Comments)    "think it started turning red around where I got shot; they gave me small doses and I did fine"    Chief Complaint  Patient presents with  . Discharge Note    Discharge from SNF to Home 11/24/2019    HPI:  86 y.o. female seen today at [facility] for discharge back to [place].She has a medical history of Hypertension,Afib Hx of DVT not on anticoagulant,CKD stage  3,Hearing impairment,/sick sinus dysfunction status post pacemaker among other conditions.She was here for short term rehabilitation for post hospital admission from 11/14/2019 - 11/18/2019 at [facility] for acute respiratory failure with Hypoxia after she presented in the ED with worsening shortness of breath for 2 weeks.In the ED she was found to be hypoxic was placed on oxygen BNP was > 500,ECHO showed grade 2 Diastolic heart failure.She was diuresed with I.V lasix about 3 Liters.CXR showed bilateral pulmonary infiltrates.Her potassium was 5.4 CR 1.4 baseline 1.3 Her Potassium and Aldactone were held and potassium improved with diuresis.She was seen by Therapy recommended SNF.she has had unremarkable stay here in Rehab. She has worked well with PT/OT now stable for discharge home.She will be discharged back to [facility].No Home health PT/OTrequired since she going to SNF.No new DME required.Facility social worker arranged discharge process. Prescription medication will be written x 1 month then patient to follow up with PCP in 1-2 weeks.She denies any acute issues this visit. Facility staff report no new concerns.  Past Medical History:  Diagnosis Date  . Arthritis   . Atrial fibrillation ([place])   . Blood transfusion   . Chronic kidney disease    stage 3 renal disease  . Diverticular disease   . DJD (degenerative joint disease)   . DJD (degenerative joint disease)   . DVT (deep venous thrombosis) ([place]) early 1980's   left medial upper thigh  . Fracture, shoulder ~ 2003; 1997   left; right  . GERD (gastroesophageal reflux  disease)   . High cholesterol   . High cholesterol   . Hypertension   . Overactive bladder   . Pacemaker   . Skin cancer of face   . Skin cancer of face   . Syncope secondary to post termination pauses 05/03/11   "passed out; first time ever"  . Urinary frequency     Past Surgical History:  Procedure Laterality Date  . APPENDECTOMY  ~ 1938    . BLADDER SUSPENSION  1985  . CARPAL TUNNEL RELEASE  04/18/2011   right; Procedure: CARPAL TUNNEL RELEASE;  Surgeon: Cammie Sickle., MD;  Location: [place];  Service: Orthopedics;  Laterality: Right;  . CARPAL TUNNEL RELEASE  12/05/2011   Procedure: CARPAL TUNNEL RELEASE;  Surgeon: Cammie Sickle., MD;  Location: [place];  Service: Orthopedics;  Laterality: Left;  . CATARACT EXTRACTION W/ INTRAOCULAR LENS  IMPLANT, BILATERAL    . HERNIA REPAIR  1986   "stomach"  . JOINT REPLACEMENT     hips  . PERMANENT PACEMAKER INSERTION N/A 05/06/2011   Procedure: PERMANENT PACEMAKER INSERTION;  Surgeon: Deboraha Sprang, MD;  Location: [facility] CATH LAB;  Service: Cardiovascular;  Laterality: N/A;  . SKIN CANCER EXCISION  2012   "twice"  . TEMPORARY PACEMAKER INSERTION N/A 05/05/2011   Procedure: TEMPORARY PACEMAKER INSERTION;  Surgeon: Clent Demark, MD;  Location: [place] CATH LAB;  Service: Cardiovascular;  Laterality: N/A;  . TOTAL HIP ARTHROPLASTY     "2 on right; 1 on left"      reports that she has never smoked. She has never used smokeless tobacco. She reports current alcohol use. She reports that she does not use drugs. Social History   Socioeconomic History  . Marital status: Widowed    Spouse name: Not on file  . Number of children: Not on file  . Years of education: Not on file  . Highest education level: Not on file  Occupational History  . Not on file  Tobacco Use  . Smoking status: Never Smoker  . Smokeless tobacco: Never Used  Vaping Use  . Vaping Use: Never used  Substance and Sexual Activity  . Alcohol use: Yes    Comment: 05/05/11 "thimble full maybe twice a year"  . Drug use: No  . Sexual activity: Never  Other Topics Concern  . Not on file  Social History Narrative  . Not on file   Social Determinants of Health   Financial Resource Strain:   . Difficulty of Paying Living Expenses: Not on file  Food Insecurity:   . Worried About  Charity fundraiser in the Last Year: Not on file  . Ran Out of Food in the Last Year: Not on file  Transportation Needs:   . Lack of Transportation (Medical): Not on file  . Lack of Transportation (Non-Medical): Not on file  Physical Activity:   . Days of Exercise per Week: Not on file  . Minutes of Exercise per Session: Not on file  Stress:   . Feeling of Stress : Not on file  Social Connections:   . Frequency of Communication with Friends and Family: Not on file  . Frequency of Social Gatherings with Friends and Family: Not on file  . Attends Religious Services: Not on file  . Active Member of Clubs or Organizations: Not on file  . Attends Archivist Meetings: Not on file  . Marital Status: Not on file  Intimate Partner Violence:   .  Fear of Current or Ex-Partner: Not on file  . Emotionally Abused: Not on file  . Physically Abused: Not on file  . Sexually Abused: Not on file   Functional Status Survey:    Allergies  Allergen Reactions  . Codeine Other (See Comments)    "can't sleep; I climb the walls"  . Statins Other (See Comments)    "ocular migraine headaches"  . Sulfa Antibiotics Anaphylaxis, Itching and Rash  . Tetanus Toxoids Other (See Comments)    "think it started turning red around where I got shot; they gave me small doses and I did fine"    Pertinent  Health Maintenance Due  Topic Date Due  . DEXA SCAN  Never done  . PNA vac Low Risk Adult (1 of 2 - PCV13) Never done  . INFLUENZA VACCINE  09/04/2019    Medications: Outpatient Encounter Medications as of 11/24/2019  Medication Sig  . acetaminophen (TYLENOL) 500 MG tablet Take 500 mg by mouth 2 (two) times daily.  [name] amLODipine (NORVASC) 5 MG tablet Take 0.5 tablets (2.5 mg total) by mouth daily.  . calcium carbonate (OS-CAL) 600 MG TABS Take 600 mg by mouth daily with breakfast.   . cholecalciferol (VITAMIN D) 1000 UNITS tablet Take 1,000 Units by mouth daily.  [name] gabapentin (NEURONTIN) 100 MG  capsule Take 1 capsule (100 mg total) by mouth 3 (three) times daily.  . metoprolol succinate (TOPROL-XL) 25 MG 24 hr tablet Take 1 tablet (25 mg total) by mouth daily.  . Multiple Vitamin (MULTIVITAMIN WITH MINERALS) TABS Take 1 tablet by mouth daily.  [name] MYRBETRIQ 50 MG TB24 tablet Take 1 tablet (50 mg total) by mouth daily.  . [DISCONTINUED] amLODipine (NORVASC) 5 MG tablet Take 2.5 mg by mouth daily.   . [DISCONTINUED] gabapentin (NEURONTIN) 100 MG capsule Take 1 capsule (100 mg total) by mouth 3 (three) times daily.  . [DISCONTINUED] metoprolol succinate (TOPROL-XL) 25 MG 24 hr tablet Take 25 mg by mouth daily.  . [DISCONTINUED] MYRBETRIQ 50 MG TB24 tablet Take 50 mg by mouth daily.    No facility-administered encounter medications on file as of 11/24/2019.     Review of Systems  Constitutional: Negative for appetite change, chills, fatigue and fever.  HENT: Positive for hearing loss. Negative for congestion, postnasal drip, rhinorrhea, sinus pressure, sinus pain, sneezing and sore throat.   Eyes: Positive for visual disturbance. Negative for pain, discharge, redness and itching.       Right eye loss of vision   Respiratory: Negative for cough, chest tightness, shortness of breath and wheezing.   Cardiovascular: Negative for chest pain, palpitations and leg swelling.  Gastrointestinal: Negative for abdominal distention, abdominal pain, constipation, diarrhea, nausea and vomiting.  Endocrine: Negative for cold intolerance, heat intolerance, polydipsia, polyphagia and polyuria.  Genitourinary: Negative for difficulty urinating, dysuria, flank pain, frequency and urgency.  Musculoskeletal: Positive for gait problem. Negative for joint swelling, myalgias and neck pain.  Skin: Negative for color change, pallor and rash.  Neurological: Positive for weakness. Negative for dizziness, speech difficulty, light-headedness, numbness and headaches.  Hematological: Does not bruise/bleed easily.    Psychiatric/Behavioral: Negative for agitation, behavioral problems, decreased concentration and sleep disturbance. The patient is not nervous/anxious.     Vitals:   11/24/19 1051  BP: 130/76  Pulse: 74  Resp: 17  Temp: 97.6 F (36.4 C)  SpO2: 96%  Weight: 109 lb (49.4 kg)  Height: 5\' 6"  (1.676 m)   Body mass index is 17.59 kg/m. Physical  Exam Vitals and nursing note reviewed.  Constitutional:      General: She is not in acute distress.    Appearance: She is underweight. She is not ill-appearing.     Comments: Frail elderly   HENT:     Head: Normocephalic.     Ears:     Comments: HOH     Nose: Nose normal. No congestion or rhinorrhea.     Mouth/Throat:     Mouth: Mucous membranes are moist.     Pharynx: Oropharynx is clear. No oropharyngeal exudate or posterior oropharyngeal erythema.  Eyes:     General: No scleral icterus.       Right eye: No discharge.        Left eye: No discharge.     Conjunctiva/sclera: Conjunctivae normal.     Comments: Left eye smaller  Right eye vision loss   Cardiovascular:     Rate and Rhythm: Normal rate. Rhythm irregular.     Pulses: Normal pulses.     Heart sounds: Normal heart sounds. No murmur heard.  No friction rub. No gallop.   Pulmonary:     Effort: Pulmonary effort is normal. No respiratory distress.     Breath sounds: Normal breath sounds. No wheezing, rhonchi or rales.  Chest:     Chest wall: No tenderness.  Abdominal:     General: Bowel sounds are normal. There is no distension.     Palpations: Abdomen is soft. There is no mass.     Tenderness: There is no abdominal tenderness. There is no right CVA tenderness, left CVA tenderness, guarding or rebound.  Musculoskeletal:        General: No swelling or tenderness.     Cervical back: Normal range of motion. No rigidity or tenderness.     Right lower leg: No edema.     Left lower leg: No edema.     Comments: Unsteady gait   Lymphadenopathy:     Cervical: No cervical  adenopathy.  Skin:    General: Skin is warm and dry.     Coloration: Skin is not pale.     Findings: No bruising, erythema or rash.  Neurological:     Mental Status: She is alert and oriented to person, place, and time.     Cranial Nerves: No cranial nerve deficit.     Sensory: No sensory deficit.     Coordination: Coordination normal.     Gait: Gait abnormal.     Comments: Generalized weakness   Psychiatric:        Mood and Affect: Mood normal.        Behavior: Behavior normal.        Thought Content: Thought content normal.        Judgment: Judgment normal.    Labs reviewed: Basic Metabolic Panel: Recent Labs    11/16/19 0222 11/17/19 0325 11/18/19 0354  NA 133* 132* 130*  K 4.0 4.2 4.8  CL 92* 90* 86*  CO2 30 32 36*  GLUCOSE 85 87 90  BUN 37* 42* 39*  CREATININE 1.61* 1.71* 1.48*  CALCIUM 9.3 9.3 9.6   Liver Function Tests: Recent Labs    11/14/19 1142  AST 41  ALT 16  ALKPHOS 75  BILITOT 0.9  PROT 6.2*  ALBUMIN 3.0*   CBC: Recent Labs    11/14/19 1142 11/15/19 0542  WBC 7.6 7.5  NEUTROABS 5.7  --   HGB 12.4 12.2  HCT 38.6 37.7  MCV 93.9 92.6  PLT  303 258   Procedures and Imaging Studies During Stay: [facility] Chest Port 1 View  Result Date: 11/14/2019 CLINICAL DATA:  Shortness of breath. EXAM: PORTABLE CHEST 1 VIEW COMPARISON:  05/07/2011 FINDINGS: The patient is rotated to the left, limiting assessment of the mediastinal structures. A dual lead pacemaker remains in place. Lung volumes are low. There is pulmonary vascular congestion with indistinctness of the vascular markings and interstitial densities throughout both lungs. No sizable pleural effusion or pneumothorax is identified. Thoracolumbar scoliosis is noted. IMPRESSION: Bilateral lung opacities favored to represent edema. Electronically Signed   By: Logan Bores M.D.   On: 11/14/2019 11:57   ECHOCARDIOGRAM COMPLETE  Result Date: 11/14/2019    ECHOCARDIOGRAM REPORT   Patient Name:   TINIE MCGLOIN  [facility] Date of Exam: 11/14/2019 Medical Rec #:  [id number]        Height:       66.0 in Accession #:    [id number]       Weight:       124.0 lb Date of Birth:  1914/01/13       BSA:          1.632 m Patient Age:    89 years        BP:           163/71 mmHg Patient Gender: F                HR:           68 bpm. Exam Location:  Inpatient Procedure: 2D Echo, Cardiac Doppler and Color Doppler Indications:    CHF  History:        Patient has prior history of Echocardiogram examinations, most                 recent 05/07/2011. CHF, Pacemaker, Arrythmias:Atrial Fibrillation;                 Risk Factors:Hypertension and Dyslipidemia.  Sonographer:    Dustin Flock Referring Phys: [id number] [place]  1. Left ventricular ejection fraction, by estimation, is 55 to 60%. The left ventricle has normal function. The left ventricle has no regional wall motion abnormalities. There is mild concentric left ventricular hypertrophy. Left ventricular diastolic parameters are consistent with Grade III diastolic dysfunction (restrictive).  2. Right ventricular systolic function is normal. The right ventricular size is normal. There is mildly elevated pulmonary artery systolic pressure.  3. Left atrial size was mildly dilated.  4. Right atrial size was mildly dilated.  5. The pericardial effusion is anterior to the right ventricle.  6. The mitral valve is degenerative. Mild mitral valve regurgitation. Moderate mitral annular calcification.  7. The tricuspid valve is degenerative. Tricuspid valve regurgitation is moderate.  8. The aortic valve is calcified. Aortic valve regurgitation is mild.  9. The inferior vena cava is normal in size with <50% respiratory variability, suggesting right atrial pressure of 8 mmHg. FINDINGS  Left Ventricle: Left ventricular ejection fraction, by estimation, is 55 to 60%. The left ventricle has normal function. The left ventricle has no regional wall motion abnormalities. The left  ventricular internal cavity size was normal in size. There is  mild concentric left ventricular hypertrophy. Left ventricular diastolic parameters are consistent with Grade III diastolic dysfunction (restrictive). Right Ventricle: The right ventricular size is normal. No increase in right ventricular wall thickness. Right ventricular systolic function is normal. There is mildly elevated pulmonary artery systolic pressure. The tricuspid regurgitant velocity is 3.20  m/s, and with an assumed right atrial pressure of 3 mmHg, the estimated right ventricular systolic pressure is 25.8 mmHg. Left Atrium: Left atrial size was mildly dilated. Right Atrium: Right atrial size was mildly dilated. Pericardium: Trivial pericardial effusion is present. The pericardial effusion is anterior to the right ventricle. Mitral Valve: The mitral valve is degenerative in appearance. There is moderate thickening of the mitral valve leaflet(s). There is moderate calcification of the mitral valve leaflet(s). Moderate mitral annular calcification. Mild mitral valve regurgitation. Tricuspid Valve: The tricuspid valve is degenerative in appearance. Tricuspid valve regurgitation is moderate. Aortic Valve: The aortic valve is calcified. Aortic valve regurgitation is mild. Aortic regurgitation PHT measures 680 msec. Aortic valve peak gradient measures 11.2 mmHg. Pulmonic Valve: The pulmonic valve was not well visualized. Pulmonic valve regurgitation is not visualized. Aorta: Aortic root could not be assessed. Venous: The inferior vena cava is normal in size with less than 50% respiratory variability, suggesting right atrial pressure of 8 mmHg. IAS/Shunts: The interatrial septum was not assessed. Additional Comments: A pacer wire is visualized.  LEFT VENTRICLE PLAX 2D LVIDd:         3.70 cm  Diastology LVIDs:         2.50 cm  LV e' medial:    6.42 cm/s LV PW:         1.40 cm  LV E/e' medial:  16.5 LV IVS:        1.30 cm  LV e' lateral:   5.66 cm/s  LVOT diam:     2.00 cm  LV E/e' lateral: 18.7 LV SV:         88 LV SV Index:   54 LVOT Area:     3.14 cm  RIGHT VENTRICLE RV Basal diam:  4.10 cm RV S prime:     8.16 cm/s TAPSE (M-mode): 2.8 cm LEFT ATRIUM             Index       RIGHT ATRIUM           Index LA diam:        4.20 cm 2.57 cm/m  RA Area:     16.70 cm LA Vol (A2C):   44.4 ml 27.20 ml/m RA Volume:   43.20 ml  26.47 ml/m LA Vol (A4C):   49.2 ml 30.14 ml/m LA Biplane Vol: 48.6 ml 29.78 ml/m  AORTIC VALVE AV Area (Vmax): 2.37 cm AV Vmax:        167.00 cm/s AV Peak Grad:   11.2 mmHg LVOT Vmax:      126.00 cm/s LVOT Vmean:     77.300 cm/s LVOT VTI:       0.281 m AI PHT:         680 msec  AORTA Ao Root diam: 2.80 cm MITRAL VALVE                TRICUSPID VALVE MV Area (PHT): 2.99 cm     TR Peak grad:   41.0 mmHg MV Decel Time: 254 msec     TR Vmax:        320.00 cm/s MV E velocity: 106.00 cm/s MV A velocity: 153.00 cm/s  SHUNTS MV E/A ratio:  0.69         Systemic VTI:  0.28 m                             Systemic Diam: 2.00 cm Charolette Forward  MD Electronically signed by Charolette Forward MD Signature Date/Time: 11/14/2019/3:49:45 PM    Final     Assessment/Plan:  1. Chronic diastolic (congestive) heart failure (HCC) No signs of fluid overload. - metoprolol succinate (TOPROL-XL) 25 MG 24 hr tablet; Take 1 tablet (25 mg total) by mouth daily.  Dispense: 30 tablet; Refill: 0  2. Paroxysmal atrial fibrillation (HCC) HR irregular. Continue on metoprolol - metoprolol succinate (TOPROL-XL) 25 MG 24 hr tablet; Take 1 tablet (25 mg total) by mouth daily.  Dispense: 30 tablet; Refill: 0 - CBC 1-2 weeks PCP   3. Essential hypertension B/p well controlled. - continue on Amlodipine and Metoprolol  - amLODipine (NORVASC) 5 MG tablet; Take 0.5 tablets (2.5 mg total) by mouth daily.  Dispense: 30 tablet; Refill: 0 - metoprolol succinate (TOPROL-XL) 25 MG 24 hr tablet; Take 1 tablet (25 mg total) by mouth daily.  Dispense: 30 tablet; Refill: 0 - CBC, BMP  in 1-2 weeks PCP   4. Chronic respiratory failure with hypoxia (HCC) Breathing stable. - continue on oxygen 2 liters nasal cannula.   5. Iron deficiency anemia, unspecified iron deficiency anemia type Hgb stable. Continue on MVI   6. Chronic kidney disease, stage 3b (HCC) CR at baseline.   7. Sinus node dysfunction (HCC) S/p pace maker   8. Overactive bladder Continue on myrbetriq  - MYRBETRIQ 50 MG TB24 tablet; Take 1 tablet (50 mg total) by mouth daily.  Dispense: 30 tablet; Refill: 0  9. Primary osteoarthritis of both knees Continue current pain regimen. - gabapentin (NEURONTIN) 100 MG capsule; Take 1 capsule (100 mg total) by mouth 3 (three) times daily.  Dispense: 90 capsule; Refill: 0   Patient is being discharged with the following home health services:   - None discharge to SNF   Patient is being discharged with the following durable medical equipment:   - None.  Patient has been advised to f/u with their PCP in 1-2 weeks to for a transitions of care visit.Social services at their facility was responsible for arranging this appointment.  Pt was provided with adequate prescriptions of noncontrolled medications to reach the scheduled appointment.For controlled substances, a limited supply was provided as appropriate for the individual patient. If the pt normally receives these medications from a pain clinic or has a contract with another physician, these medications should be received from that clinic or physician only).    Future labs/tests needed:  CBC, BMP in 1-2 weeks PCP

## 2019-11-28 DIAGNOSIS — N3281 Overactive bladder: Secondary | ICD-10-CM | POA: Diagnosis not present

## 2019-11-28 DIAGNOSIS — G619 Inflammatory polyneuropathy, unspecified: Secondary | ICD-10-CM | POA: Diagnosis not present

## 2019-11-28 DIAGNOSIS — I10 Essential (primary) hypertension: Secondary | ICD-10-CM | POA: Diagnosis not present

## 2019-11-28 DIAGNOSIS — N182 Chronic kidney disease, stage 2 (mild): Secondary | ICD-10-CM | POA: Diagnosis not present

## 2019-11-28 DIAGNOSIS — E875 Hyperkalemia: Secondary | ICD-10-CM | POA: Diagnosis not present

## 2019-11-28 DIAGNOSIS — I482 Chronic atrial fibrillation, unspecified: Secondary | ICD-10-CM | POA: Diagnosis not present

## 2019-11-28 DIAGNOSIS — E871 Hypo-osmolality and hyponatremia: Secondary | ICD-10-CM | POA: Diagnosis not present

## 2019-11-28 DIAGNOSIS — M810 Age-related osteoporosis without current pathological fracture: Secondary | ICD-10-CM | POA: Diagnosis not present

## 2019-11-28 DIAGNOSIS — J9601 Acute respiratory failure with hypoxia: Secondary | ICD-10-CM | POA: Diagnosis not present

## 2019-11-29 DIAGNOSIS — E871 Hypo-osmolality and hyponatremia: Secondary | ICD-10-CM | POA: Diagnosis not present

## 2019-11-29 DIAGNOSIS — I5033 Acute on chronic diastolic (congestive) heart failure: Secondary | ICD-10-CM | POA: Diagnosis not present

## 2019-11-29 DIAGNOSIS — I482 Chronic atrial fibrillation, unspecified: Secondary | ICD-10-CM | POA: Diagnosis not present

## 2019-11-29 DIAGNOSIS — J9601 Acute respiratory failure with hypoxia: Secondary | ICD-10-CM | POA: Diagnosis not present

## 2019-11-29 DIAGNOSIS — E875 Hyperkalemia: Secondary | ICD-10-CM | POA: Diagnosis not present

## 2019-11-29 DIAGNOSIS — N182 Chronic kidney disease, stage 2 (mild): Secondary | ICD-10-CM | POA: Diagnosis not present

## 2019-11-30 ENCOUNTER — Other Ambulatory Visit: Payer: Self-pay | Admitting: *Deleted

## 2019-11-30 NOTE — Patient Outreach (Signed)
[facility] Coordinator follow up. Member screened for potential [facility] Care Management needs as a benefit of [place] [employer].  Verified in Patient [place] member transitioned from [facility] on 11/24/19. Per chart notes, member transitioned to [facility].  No identifiable [facility] Care Management needs at this time.   Marthenia Rolling, MSN-Ed, RN,BSN [place] Acute Care Coordinator [phone] [facility]) [phone]  (Toll free office)

## 2019-12-05 DIAGNOSIS — E875 Hyperkalemia: Secondary | ICD-10-CM | POA: Diagnosis not present

## 2019-12-05 DIAGNOSIS — I5033 Acute on chronic diastolic (congestive) heart failure: Secondary | ICD-10-CM | POA: Diagnosis not present

## 2019-12-05 DIAGNOSIS — N182 Chronic kidney disease, stage 2 (mild): Secondary | ICD-10-CM | POA: Diagnosis not present

## 2019-12-05 DIAGNOSIS — I10 Essential (primary) hypertension: Secondary | ICD-10-CM | POA: Diagnosis not present

## 2019-12-05 DIAGNOSIS — N3281 Overactive bladder: Secondary | ICD-10-CM | POA: Diagnosis not present

## 2019-12-05 DIAGNOSIS — E871 Hypo-osmolality and hyponatremia: Secondary | ICD-10-CM | POA: Diagnosis not present

## 2019-12-05 DIAGNOSIS — M810 Age-related osteoporosis without current pathological fracture: Secondary | ICD-10-CM | POA: Diagnosis not present

## 2019-12-05 DIAGNOSIS — L89609 Pressure ulcer of unspecified heel, unspecified stage: Secondary | ICD-10-CM | POA: Diagnosis not present

## 2019-12-05 DIAGNOSIS — J9601 Acute respiratory failure with hypoxia: Secondary | ICD-10-CM | POA: Diagnosis not present

## 2019-12-05 DIAGNOSIS — M6281 Muscle weakness (generalized): Secondary | ICD-10-CM | POA: Diagnosis not present

## 2019-12-05 DIAGNOSIS — F4323 Adjustment disorder with mixed anxiety and depressed mood: Secondary | ICD-10-CM | POA: Diagnosis not present

## 2019-12-12 DIAGNOSIS — H353233 Exudative age-related macular degeneration, bilateral, with inactive scar: Secondary | ICD-10-CM | POA: Diagnosis not present

## 2019-12-12 DIAGNOSIS — H1032 Unspecified acute conjunctivitis, left eye: Secondary | ICD-10-CM | POA: Diagnosis not present

## 2019-12-12 DIAGNOSIS — Z961 Presence of intraocular lens: Secondary | ICD-10-CM | POA: Diagnosis not present

## 2019-12-14 DIAGNOSIS — L89629 Pressure ulcer of left heel, unspecified stage: Secondary | ICD-10-CM | POA: Diagnosis not present

## 2019-12-20 ENCOUNTER — Telehealth: Payer: Self-pay | Admitting: Internal Medicine

## 2019-12-20 NOTE — Telephone Encounter (Signed)
Family advised to take remote monitor to [facility] to provide monitoring at facility.

## 2019-12-20 NOTE — Telephone Encounter (Signed)
Per son, patient has been moved to [facility] and he would like to know what to do with her pace maker machine. He states as far as he knows she does not need it. Please advise.

## 2019-12-21 ENCOUNTER — Emergency Department (HOSPITAL_COMMUNITY): Payer: Medicare Other

## 2019-12-21 ENCOUNTER — Other Ambulatory Visit: Payer: Self-pay

## 2019-12-21 ENCOUNTER — Inpatient Hospital Stay (HOSPITAL_COMMUNITY): Payer: Medicare Other

## 2019-12-21 ENCOUNTER — Inpatient Hospital Stay (HOSPITAL_COMMUNITY)
Admission: EM | Admit: 2019-12-21 | Discharge: 2019-12-26 | DRG: 177 | Disposition: A | Discharge status: Skilled Nursing Facility | Payer: Medicare Other | Source: Skilled Nursing Facility | Attending: Internal Medicine | Admitting: Internal Medicine

## 2019-12-21 DIAGNOSIS — I2510 Atherosclerotic heart disease of native coronary artery without angina pectoris: Secondary | ICD-10-CM | POA: Diagnosis not present

## 2019-12-21 DIAGNOSIS — N1832 Chronic kidney disease, stage 3b: Secondary | ICD-10-CM | POA: Diagnosis present

## 2019-12-21 DIAGNOSIS — Z950 Presence of cardiac pacemaker: Secondary | ICD-10-CM

## 2019-12-21 DIAGNOSIS — L89613 Pressure ulcer of right heel, stage 3: Secondary | ICD-10-CM | POA: Diagnosis present

## 2019-12-21 DIAGNOSIS — Z66 Do not resuscitate: Secondary | ICD-10-CM | POA: Diagnosis present

## 2019-12-21 DIAGNOSIS — I5031 Acute diastolic (congestive) heart failure: Secondary | ICD-10-CM

## 2019-12-21 DIAGNOSIS — E875 Hyperkalemia: Secondary | ICD-10-CM | POA: Diagnosis not present

## 2019-12-21 DIAGNOSIS — E7800 Pure hypercholesterolemia, unspecified: Secondary | ICD-10-CM | POA: Diagnosis present

## 2019-12-21 DIAGNOSIS — K219 Gastro-esophageal reflux disease without esophagitis: Secondary | ICD-10-CM | POA: Diagnosis present

## 2019-12-21 DIAGNOSIS — J9600 Acute respiratory failure, unspecified whether with hypoxia or hypercapnia: Secondary | ICD-10-CM | POA: Diagnosis not present

## 2019-12-21 DIAGNOSIS — J8489 Other specified interstitial pulmonary diseases: Secondary | ICD-10-CM | POA: Diagnosis not present

## 2019-12-21 DIAGNOSIS — H9190 Unspecified hearing loss, unspecified ear: Secondary | ICD-10-CM | POA: Diagnosis present

## 2019-12-21 DIAGNOSIS — R0902 Hypoxemia: Secondary | ICD-10-CM | POA: Diagnosis not present

## 2019-12-21 DIAGNOSIS — Z885 Allergy status to narcotic agent status: Secondary | ICD-10-CM

## 2019-12-21 DIAGNOSIS — I509 Heart failure, unspecified: Secondary | ICD-10-CM

## 2019-12-21 DIAGNOSIS — I495 Sick sinus syndrome: Secondary | ICD-10-CM | POA: Diagnosis present

## 2019-12-21 DIAGNOSIS — I482 Chronic atrial fibrillation, unspecified: Secondary | ICD-10-CM | POA: Diagnosis present

## 2019-12-21 DIAGNOSIS — E440 Moderate protein-calorie malnutrition: Secondary | ICD-10-CM | POA: Insufficient documentation

## 2019-12-21 DIAGNOSIS — I5033 Acute on chronic diastolic (congestive) heart failure: Secondary | ICD-10-CM | POA: Diagnosis present

## 2019-12-21 DIAGNOSIS — R1310 Dysphagia, unspecified: Secondary | ICD-10-CM | POA: Diagnosis present

## 2019-12-21 DIAGNOSIS — R0602 Shortness of breath: Secondary | ICD-10-CM | POA: Diagnosis not present

## 2019-12-21 DIAGNOSIS — J690 Pneumonitis due to inhalation of food and vomit: Secondary | ICD-10-CM | POA: Diagnosis present

## 2019-12-21 DIAGNOSIS — Z681 Body mass index (BMI) 19 or less, adult: Secondary | ICD-10-CM

## 2019-12-21 DIAGNOSIS — J90 Pleural effusion, not elsewhere classified: Secondary | ICD-10-CM | POA: Diagnosis not present

## 2019-12-21 DIAGNOSIS — Z85828 Personal history of other malignant neoplasm of skin: Secondary | ICD-10-CM

## 2019-12-21 DIAGNOSIS — Z882 Allergy status to sulfonamides status: Secondary | ICD-10-CM | POA: Diagnosis not present

## 2019-12-21 DIAGNOSIS — L89623 Pressure ulcer of left heel, stage 3: Secondary | ICD-10-CM | POA: Diagnosis present

## 2019-12-21 DIAGNOSIS — I358 Other nonrheumatic aortic valve disorders: Secondary | ICD-10-CM | POA: Diagnosis not present

## 2019-12-21 DIAGNOSIS — I130 Hypertensive heart and chronic kidney disease with heart failure and stage 1 through stage 4 chronic kidney disease, or unspecified chronic kidney disease: Secondary | ICD-10-CM | POA: Diagnosis present

## 2019-12-21 DIAGNOSIS — Z7189 Other specified counseling: Secondary | ICD-10-CM

## 2019-12-21 DIAGNOSIS — Z7401 Bed confinement status: Secondary | ICD-10-CM | POA: Diagnosis not present

## 2019-12-21 DIAGNOSIS — M1990 Unspecified osteoarthritis, unspecified site: Secondary | ICD-10-CM | POA: Diagnosis present

## 2019-12-21 DIAGNOSIS — Z20822 Contact with and (suspected) exposure to covid-19: Secondary | ICD-10-CM | POA: Diagnosis present

## 2019-12-21 DIAGNOSIS — J9601 Acute respiratory failure with hypoxia: Secondary | ICD-10-CM | POA: Diagnosis present

## 2019-12-21 DIAGNOSIS — I517 Cardiomegaly: Secondary | ICD-10-CM | POA: Diagnosis not present

## 2019-12-21 DIAGNOSIS — Z888 Allergy status to other drugs, medicaments and biological substances status: Secondary | ICD-10-CM

## 2019-12-21 DIAGNOSIS — Z9109 Other allergy status, other than to drugs and biological substances: Secondary | ICD-10-CM | POA: Diagnosis not present

## 2019-12-21 DIAGNOSIS — I10 Essential (primary) hypertension: Secondary | ICD-10-CM | POA: Diagnosis present

## 2019-12-21 DIAGNOSIS — M2550 Pain in unspecified joint: Secondary | ICD-10-CM | POA: Diagnosis not present

## 2019-12-21 DIAGNOSIS — Z79899 Other long term (current) drug therapy: Secondary | ICD-10-CM

## 2019-12-21 DIAGNOSIS — R4182 Altered mental status, unspecified: Secondary | ICD-10-CM | POA: Diagnosis not present

## 2019-12-21 DIAGNOSIS — L8990 Pressure ulcer of unspecified site, unspecified stage: Secondary | ICD-10-CM | POA: Diagnosis present

## 2019-12-21 DIAGNOSIS — R52 Pain, unspecified: Secondary | ICD-10-CM

## 2019-12-21 DIAGNOSIS — I4891 Unspecified atrial fibrillation: Secondary | ICD-10-CM | POA: Diagnosis present

## 2019-12-21 DIAGNOSIS — Z96643 Presence of artificial hip joint, bilateral: Secondary | ICD-10-CM | POA: Diagnosis present

## 2019-12-21 DIAGNOSIS — E876 Hypokalemia: Secondary | ICD-10-CM | POA: Diagnosis present

## 2019-12-21 LAB — CBC WITH DIFFERENTIAL/PLATELET
Abs Immature Granulocytes: 0.03 10*3/uL (ref 0.00–0.07)
Basophils Absolute: 0 10*3/uL (ref 0.0–0.1)
Basophils Relative: 0 %
Eosinophils Absolute: 0.2 10*3/uL (ref 0.0–0.5)
Eosinophils Relative: 2 %
HCT: 38.6 % (ref 36.0–46.0)
Hemoglobin: 12.1 g/dL (ref 12.0–15.0)
Immature Granulocytes: 0 %
Lymphocytes Relative: 10 %
Lymphs Abs: 0.8 10*3/uL (ref 0.7–4.0)
MCH: 30.7 pg (ref 26.0–34.0)
MCHC: 31.3 g/dL (ref 30.0–36.0)
MCV: 98 fL (ref 80.0–100.0)
Monocytes Absolute: 0.6 10*3/uL (ref 0.1–1.0)
Monocytes Relative: 8 %
Neutro Abs: 6.3 10*3/uL (ref 1.7–7.7)
Neutrophils Relative %: 80 %
Platelets: 192 10*3/uL (ref 150–400)
RBC: 3.94 MIL/uL (ref 3.87–5.11)
RDW: 15.4 % (ref 11.5–15.5)
WBC: 8 10*3/uL (ref 4.0–10.5)
nRBC: 0 % (ref 0.0–0.2)

## 2019-12-21 LAB — BASIC METABOLIC PANEL
Anion gap: 8 (ref 5–15)
BUN: 29 mg/dL — ABNORMAL HIGH (ref 8–23)
CO2: 29 mmol/L (ref 22–32)
Calcium: 10.1 mg/dL (ref 8.9–10.3)
Chloride: 99 mmol/L (ref 98–111)
Creatinine, Ser: 1.11 mg/dL — ABNORMAL HIGH (ref 0.44–1.00)
GFR, Estimated: 43 mL/min — ABNORMAL LOW (ref >60)
Glucose, Bld: 99 mg/dL (ref 70–99)
Potassium: 5.2 mmol/L — ABNORMAL HIGH (ref 3.5–5.1)
Sodium: 136 mmol/L (ref 135–145)

## 2019-12-21 LAB — RESPIRATORY PANEL BY RT PCR (FLU A&B, COVID)
Influenza A by PCR: NEGATIVE
Influenza B by PCR: NEGATIVE
SARS Coronavirus 2 by RT PCR: NEGATIVE

## 2019-12-21 LAB — PROCALCITONIN: Procalcitonin: 0.11 ng/mL

## 2019-12-21 LAB — BRAIN NATRIURETIC PEPTIDE: B Natriuretic Peptide: 798.3 pg/mL — ABNORMAL HIGH (ref 0.0–100.0)

## 2019-12-21 MED ORDER — ADULT MULTIVITAMIN W/MINERALS CH
1.0000 | ORAL_TABLET | Freq: Every day | ORAL | Status: DC
  Administered 2019-12-23 – 2019-12-26 (×4): 1 via ORAL
  Filled 2019-12-21 (×4): qty 1

## 2019-12-21 MED ORDER — METOPROLOL SUCCINATE ER 25 MG PO TB24
25.0000 mg | ORAL_TABLET | Freq: Every day | ORAL | Status: DC
  Administered 2019-12-23 – 2019-12-26 (×4): 25 mg via ORAL
  Filled 2019-12-21 (×5): qty 1

## 2019-12-21 MED ORDER — AMLODIPINE BESYLATE 2.5 MG PO TABS
2.5000 mg | ORAL_TABLET | Freq: Every day | ORAL | Status: DC
  Administered 2019-12-22 – 2019-12-26 (×4): 2.5 mg via ORAL
  Filled 2019-12-21 (×5): qty 1

## 2019-12-21 MED ORDER — GATIFLOXACIN 0.5 % OP SOLN
1.0000 [drp] | Freq: Four times a day (QID) | OPHTHALMIC | Status: AC
  Administered 2019-12-21 – 2019-12-23 (×5): 1 [drp] via OPHTHALMIC
  Filled 2019-12-21 (×2): qty 2.5

## 2019-12-21 MED ORDER — HYDRALAZINE HCL 25 MG PO TABS: 25.0000 mg | ORAL_TABLET | Freq: Four times a day (QID) | ORAL | Status: DC | PRN

## 2019-12-21 MED ORDER — ONDANSETRON HCL 4 MG/2ML IJ SOLN: 4.0000 mg | Freq: Four times a day (QID) | INTRAMUSCULAR | Status: DC | PRN

## 2019-12-21 MED ORDER — SODIUM CHLORIDE 0.9 % IV SOLN
250.0000 mL | INTRAVENOUS | Status: DC | PRN
  Administered 2019-12-26: 250 mL via INTRAVENOUS

## 2019-12-21 MED ORDER — IPRATROPIUM BROMIDE 0.02 % IN SOLN
0.5000 mg | Freq: Four times a day (QID) | RESPIRATORY_TRACT | Status: DC
  Administered 2019-12-21: 0.5 mg via RESPIRATORY_TRACT
  Filled 2019-12-21: qty 2.5

## 2019-12-21 MED ORDER — VITAMIN D 25 MCG (1000 UNIT) PO TABS
1000.0000 [IU] | ORAL_TABLET | Freq: Every day | ORAL | Status: DC
  Administered 2019-12-23 – 2019-12-26 (×4): 1000 [IU] via ORAL
  Filled 2019-12-21 (×4): qty 1

## 2019-12-21 MED ORDER — ACETAMINOPHEN 500 MG PO TABS
500.0000 mg | ORAL_TABLET | Freq: Two times a day (BID) | ORAL | Status: DC
  Administered 2019-12-21 – 2019-12-26 (×9): 500 mg via ORAL
  Filled 2019-12-21 (×9): qty 1

## 2019-12-21 MED ORDER — POLYETHYLENE GLYCOL 3350 17 G PO PACK: 17.0000 g | PACK | Freq: Every day | ORAL | Status: DC | PRN

## 2019-12-21 MED ORDER — VANCOMYCIN HCL IN DEXTROSE 1-5 GM/200ML-% IV SOLN: 1000.0000 mg | Freq: Once | INTRAVENOUS | Status: DC

## 2019-12-21 MED ORDER — MIRABEGRON ER 50 MG PO TB24
50.0000 mg | ORAL_TABLET | Freq: Every day | ORAL | Status: DC
  Administered 2019-12-22 – 2019-12-25 (×4): 50 mg via ORAL
  Filled 2019-12-21 (×6): qty 1

## 2019-12-21 MED ORDER — CALCIUM CARBONATE 1250 (500 CA) MG PO TABS
625.0000 mg | ORAL_TABLET | Freq: Every day | ORAL | Status: DC
  Administered 2019-12-23 – 2019-12-26 (×4): 625 mg via ORAL
  Filled 2019-12-21 (×4): qty 1

## 2019-12-21 MED ORDER — SODIUM CHLORIDE 0.9% FLUSH
3.0000 mL | Freq: Two times a day (BID) | INTRAVENOUS | Status: DC
  Administered 2019-12-21 – 2019-12-26 (×7): 3 mL via INTRAVENOUS

## 2019-12-21 MED ORDER — GABAPENTIN 100 MG PO CAPS
100.0000 mg | ORAL_CAPSULE | Freq: Three times a day (TID) | ORAL | Status: DC
  Administered 2019-12-21 – 2019-12-26 (×14): 100 mg via ORAL
  Filled 2019-12-21 (×14): qty 1

## 2019-12-21 MED ORDER — FUROSEMIDE 10 MG/ML IJ SOLN
20.0000 mg | Freq: Every day | INTRAMUSCULAR | Status: DC
  Administered 2019-12-21: 20 mg via INTRAVENOUS
  Filled 2019-12-21: qty 2

## 2019-12-21 MED ORDER — ACETAMINOPHEN 325 MG PO TABS: 650.0000 mg | ORAL_TABLET | ORAL | Status: DC | PRN

## 2019-12-21 MED ORDER — SODIUM CHLORIDE 0.9 % IV SOLN: 2.0000 g | Freq: Once | INTRAVENOUS | Status: DC

## 2019-12-21 MED ORDER — SODIUM CHLORIDE 0.9 % IV SOLN
3.0000 g | Freq: Two times a day (BID) | INTRAVENOUS | Status: DC
  Administered 2019-12-21 – 2019-12-26 (×10): 3 g via INTRAVENOUS
  Filled 2019-12-21 (×7): qty 8
  Filled 2019-12-21: qty 3
  Filled 2019-12-21 (×3): qty 8
  Filled 2019-12-21: qty 3

## 2019-12-21 MED ORDER — IPRATROPIUM BROMIDE 0.02 % IN SOLN
0.5000 mg | Freq: Two times a day (BID) | RESPIRATORY_TRACT | Status: DC
  Administered 2019-12-22 – 2019-12-26 (×9): 0.5 mg via RESPIRATORY_TRACT
  Filled 2019-12-21 (×9): qty 2.5

## 2019-12-21 MED ORDER — SODIUM CHLORIDE 0.9% FLUSH: 3.0000 mL | INTRAVENOUS | Status: DC | PRN

## 2019-12-21 MED ORDER — PREDNISOLONE 5 MG PO TABS
20.0000 mg | ORAL_TABLET | Freq: Every day | ORAL | Status: DC
  Administered 2019-12-21 – 2019-12-26 (×5): 20 mg via ORAL
  Filled 2019-12-21 (×6): qty 4

## 2019-12-21 MED ORDER — MAGNESIUM HYDROXIDE 400 MG/5ML PO SUSP
30.0000 mL | Freq: Every day | ORAL | Status: DC | PRN
  Filled 2019-12-21: qty 30

## 2019-12-21 MED ORDER — BUDESONIDE 0.25 MG/2ML IN SUSP
0.2500 mg | Freq: Two times a day (BID) | RESPIRATORY_TRACT | Status: DC
  Administered 2019-12-21 – 2019-12-26 (×10): 0.25 mg via RESPIRATORY_TRACT
  Filled 2019-12-21 (×12): qty 2

## 2019-12-21 MED ORDER — HEPARIN SODIUM (PORCINE) 5000 UNIT/ML IJ SOLN
5000.0000 [IU] | Freq: Two times a day (BID) | INTRAMUSCULAR | Status: DC
  Administered 2019-12-21 – 2019-12-26 (×10): 5000 [IU] via SUBCUTANEOUS
  Filled 2019-12-21 (×10): qty 1

## 2019-12-21 NOTE — ED Provider Notes (Signed)
[facility] EMERGENCY DEPARTMENT Provider Note   CSN: [id number] Arrival date & time: 12/21/19  1007     History Chief Complaint  Patient presents with  . Aspiration    Julie Mcpherson is a 86 y.o. female.  Patient is a 86 year old female with history of congestive heart failure, chronic renal insufficiency, atrial fibrillation, pacemaker.  She presents today for evaluation of shortness of breath.  She was sent from her extended care facility after they found her oxygen saturations to be low and patient to have audible wheezing.  Patient is extremely hard of hearing and little history is obtainable secondary to this.  The history is provided by the patient.       Past Medical History:  Diagnosis Date  . Arthritis   . Atrial fibrillation ([place])   . Blood transfusion   . Chronic kidney disease    stage 3 renal disease  . Diverticular disease   . DJD (degenerative joint disease)   . DJD (degenerative joint disease)   . DVT (deep venous thrombosis) ([place]) early 1980's   left medial upper thigh  . Fracture, shoulder ~ 2003; 1997   left; right  . GERD (gastroesophageal reflux disease)   . High cholesterol   . High cholesterol   . Hypertension   . Overactive bladder   . Pacemaker   . Skin cancer of face   . Skin cancer of face   . Syncope secondary to post termination pauses 05/03/11   "passed out; first time ever"  . Urinary frequency     Patient Active Problem List   Diagnosis Date Noted  . Overactive bladder   . DJD (degenerative joint disease)   . Pressure injury of right buttock, stage 1 11/22/2019  . Chronic kidney disease, stage 3b ([place]) 11/22/2019  . Dilutional hyponatremia 11/22/2019  . Iron deficiency anemia 11/22/2019  . Pressure injury of skin 11/15/2019  . Acute respiratory failure with hypoxia ([place]) 11/15/2019  . Acute diastolic CHF (congestive heart failure) ([place]) 11/15/2019  . Acute pulmonary edema (HCC)   . Palliative care by  specialist   . Goals of care, counseling/discussion   . A-fib ([place]) 11/02/2019  . Essential hypertension 06/28/2013  . Sinus node dysfunction ([place]) 08/22/2011  . Pacemaker-[employer] 08/15/2011  . New onset atrial fibrillation ([place]) 05/05/2011    Past Surgical History:  Procedure Laterality Date  . APPENDECTOMY  ~ 1938  . BLADDER SUSPENSION  1985  . CARPAL TUNNEL RELEASE  04/18/2011   right; Procedure: CARPAL TUNNEL RELEASE;  Surgeon: Cammie Sickle., MD;  Location: [facility];  Service: Orthopedics;  Laterality: Right;  . CARPAL TUNNEL RELEASE  12/05/2011   Procedure: CARPAL TUNNEL RELEASE;  Surgeon: Cammie Sickle., MD;  Location: [facility];  Service: Orthopedics;  Laterality: Left;  . CATARACT EXTRACTION W/ INTRAOCULAR LENS  IMPLANT, BILATERAL    . HERNIA REPAIR  1986   "stomach"  . JOINT REPLACEMENT     hips  . PERMANENT PACEMAKER INSERTION N/A 05/06/2011   Procedure: PERMANENT PACEMAKER INSERTION;  Surgeon: Deboraha Sprang, MD;  Location: [facility] CATH LAB;  Service: Cardiovascular;  Laterality: N/A;  . SKIN CANCER EXCISION  2012   "twice"  . TEMPORARY PACEMAKER INSERTION N/A 05/05/2011   Procedure: TEMPORARY PACEMAKER INSERTION;  Surgeon: Clent Demark, MD;  Location: [facility] CATH LAB;  Service: Cardiovascular;  Laterality: N/A;  . TOTAL HIP ARTHROPLASTY     "2 on right; 1 on  left"     OB History   No obstetric history on file.     Family History  Family history unknown: Yes    Social History   Tobacco Use  . Smoking status: Never Smoker  . Smokeless tobacco: Never Used  Vaping Use  . Vaping Use: Never used  Substance Use Topics  . Alcohol use: Yes    Comment: 05/05/11 "thimble full maybe twice a year"  . Drug use: No    Home Medications Prior to Admission medications   Medication Sig Start Date End Date Taking? Authorizing Provider  acetaminophen (TYLENOL) 500 MG tablet Take 500 mg by mouth 2 (two) times daily.    [provider]  amLODipine (NORVASC) 5 MG tablet Take 0.5 tablets (2.5 mg total) by mouth daily. 11/24/19   Ngetich, Dinah C, NP  calcium carbonate (OS-CAL) 600 MG TABS Take 600 mg by mouth daily with breakfast.     [provider]  cholecalciferol (VITAMIN D) 1000 UNITS tablet Take 1,000 Units by mouth daily.    [provider]  gabapentin (NEURONTIN) 100 MG capsule Take 1 capsule (100 mg total) by mouth 3 (three) times daily. 11/24/19   Ngetich, Dinah C, NP  metoprolol succinate (TOPROL-XL) 25 MG 24 hr tablet Take 1 tablet (25 mg total) by mouth daily. 11/24/19   Ngetich, Dinah C, NP  Multiple Vitamin (MULTIVITAMIN WITH MINERALS) TABS Take 1 tablet by mouth daily.    [provider]  MYRBETRIQ 50 MG TB24 tablet Take 1 tablet (50 mg total) by mouth daily. 11/24/19   Ngetich, Dinah C, NP    Allergies    Codeine, Statins, Sulfa antibiotics, and Tetanus toxoids  Review of Systems   Review of Systems  Unable to perform ROS: Other    Physical Exam Updated Vital Signs BP (!) 152/87 (BP Location: Right Arm)   Pulse 72   Temp (!) 97 F (36.1 C) (Tympanic)   Resp (!) 21   SpO2 (!) 3%   Physical Exam Vitals and nursing note reviewed.  Constitutional:      General: She is not in acute distress.    Appearance: She is well-developed. She is not diaphoretic.  HENT:     Head: Normocephalic and atraumatic.  Cardiovascular:     Rate and Rhythm: Normal rate and regular rhythm.     Heart sounds: No murmur heard.  No friction rub. No gallop.   Pulmonary:     Effort: Pulmonary effort is normal. No respiratory distress.     Breath sounds: Rales present. No wheezing.     Comments: There is slight rales in the bases bilaterally. Abdominal:     General: Bowel sounds are normal. There is no distension.     Palpations: Abdomen is soft.     Tenderness: There is no abdominal tenderness.  Musculoskeletal:        General: Normal range of motion.     Cervical back: Normal range of  motion and neck supple.     Right lower leg: No edema.     Left lower leg: No edema.  Skin:    General: Skin is warm and dry.  Neurological:     Mental Status: She is alert and oriented to person, place, and time.     ED Results / Procedures / Treatments   Labs (all labs ordered are listed, but only abnormal results are displayed) Labs Reviewed  RESPIRATORY PANEL BY RT PCR (FLU A&B, COVID)  COMPREHENSIVE METABOLIC PANEL  CBC WITH DIFFERENTIAL/PLATELET  BRAIN NATRIURETIC PEPTIDE    EKG None  Radiology No results found.  Procedures Procedures (including critical care time)  Medications Ordered in ED Medications - No data to display  ED Course  I have reviewed the triage vital signs and the nursing notes.  Pertinent labs & imaging results that were available during my care of the patient were reviewed by me and considered in my medical decision making (see chart for details).    MDM Rules/Calculators/A&P  Is a [age]-year-old female presenting with complaints of shortness of breath.  Patient arrives here with oxygen saturations on oxygen in the low 90s, however desaturates to the 70s when oxygen is discontinued.  Her chest x-ray shows bilateral infiltrates consistent with pneumonia.  Patient treated for HCAP as she has had a recent admission for the same.  Her Covid test is negative.  Due to her worsening oxygen requirement, I feel as though patient will require admission for IV antibiotics.  I have spoken with Dr. Roosevelt Locks who agrees to admit.  Final Clinical Impression(s) / ED Diagnoses Final diagnoses:  Pain    Rx / DC Orders ED Discharge Orders    None       Veryl Speak, MD 12/21/19 1539

## 2019-12-21 NOTE — ED Notes (Signed)
Redrew BNP sample and sent to lab

## 2019-12-21 NOTE — ED Triage Notes (Signed)
Pt was brought by EMS. EMS states pt was called out to Nursing home because pt aspirated over the weekend. Pt does not seem to be in distress. Pt is hard of hearing. Pt is currently on 3L of O2.

## 2019-12-21 NOTE — ED Notes (Signed)
Patient transported to CT 

## 2019-12-21 NOTE — H&P (Signed)
History and Physical    Julie Mcpherson DOB: 03/22/1913 DOA: 12/21/2019  PCP: Lajean Manes, MD (Confirm with patient/family/NH records and if not entered, this has to be entered at [facility] point of entry) Patient coming from: NH  I have personally briefly reviewed patient's old medical records in [facility] [place]  Chief Complaint: SOB  HPI: Julie Mcpherson is a 86 y.o. female with medical history significant of chronic diastolic CHF, LVEF 55 to 40% to third-degree diastolic dysfunction 34/7425, sick sinus syndrome status post PPM, chronic A. fib not on anticoagulation, HTN, CKD stage III, hearing impairment, presented with increasing shortness of breath.  Patient has significant heart rate 1, most history provided by patient's son over at bedside.  Son reported patient has had " aspiration pneumonia 2 days ago" was placed on p.o. antibiotics, main symptoms appears to be increasing shortness of breath.  Denied any fever chills.  Patient had pneumonia before, but not recent years.   Patient was hospitalized 1 month ago for fluid overload, and was treated with IV Lasix which resolved.  Patient was discharged back, and not on routine diuresis because the patient age and other comorbidities. ED Course: Stabilized on 3 L, but very easily to deteriorate.  Chest x-ray showed bilateral lower field infiltrate suspect for pneumonia versus CHF decompensation.  Vancomycin and cefepime were given in the ED.  WBC 8.0, creatinine 1.1, potassium 5.2.  Review of Systems: Unable to perform, very hard of hearing.   Past Medical History:  Diagnosis Date  . Arthritis   . Atrial fibrillation ([place])   . Blood transfusion   . Chronic kidney disease    stage 3 renal disease  . Diverticular disease   . DJD (degenerative joint disease)   . DJD (degenerative joint disease)   . DVT (deep venous thrombosis) ([place]) early 1980's   left medial upper thigh  . Fracture, shoulder ~ 2003; 1997   left;  right  . GERD (gastroesophageal reflux disease)   . High cholesterol   . High cholesterol   . Hypertension   . Overactive bladder   . Pacemaker   . Skin cancer of face   . Skin cancer of face   . Syncope secondary to post termination pauses 05/03/11   "passed out; first time ever"  . Urinary frequency     Past Surgical History:  Procedure Laterality Date  . APPENDECTOMY  ~ 1938  . BLADDER SUSPENSION  1985  . CARPAL TUNNEL RELEASE  04/18/2011   right; Procedure: CARPAL TUNNEL RELEASE;  Surgeon: Cammie Sickle., MD;  Location: [facility];  Service: Orthopedics;  Laterality: Right;  . CARPAL TUNNEL RELEASE  12/05/2011   Procedure: CARPAL TUNNEL RELEASE;  Surgeon: Cammie Sickle., MD;  Location: [facility];  Service: Orthopedics;  Laterality: Left;  . CATARACT EXTRACTION W/ INTRAOCULAR LENS  IMPLANT, BILATERAL    . HERNIA REPAIR  1986   "stomach"  . JOINT REPLACEMENT     hips  . PERMANENT PACEMAKER INSERTION N/A 05/06/2011   Procedure: PERMANENT PACEMAKER INSERTION;  Surgeon: Deboraha Sprang, MD;  Location: [facility] CATH LAB;  Service: Cardiovascular;  Laterality: N/A;  . SKIN CANCER EXCISION  2012   "twice"  . TEMPORARY PACEMAKER INSERTION N/A 05/05/2011   Procedure: TEMPORARY PACEMAKER INSERTION;  Surgeon: Clent Demark, MD;  Location: [facility] CATH LAB;  Service: Cardiovascular;  Laterality: N/A;  . TOTAL HIP ARTHROPLASTY     "2 on right; 1  on left"     reports that she has never smoked. She has never used smokeless tobacco. She reports current alcohol use. She reports that she does not use drugs.  Allergies  Allergen Reactions  . Codeine Other (See Comments)    "can't sleep; I climb the walls"  . Statins Other (See Comments)    "ocular migraine headaches"  . Sulfa Antibiotics Anaphylaxis, Itching and Rash  . Other     Adsorbed-listed on MAR no reaction listed  . Tetanus Toxoids Other (See Comments)    "think it started turning red around where I  got shot; they gave me small doses and I did fine"    Family History  Family history unknown: Yes     Prior to Admission medications   Medication Sig Start Date End Date Taking? Authorizing Provider  acetaminophen (TYLENOL) 500 MG tablet Take 500 mg by mouth 2 (two) times daily.   Yes [provider]  amLODipine (NORVASC) 2.5 MG tablet Take 2.5 mg by mouth daily.   Yes [provider]  calcium carbonate (OS-CAL) 600 MG TABS Take 600 mg by mouth daily with breakfast.    Yes [provider]  cholecalciferol (VITAMIN D) 1000 UNITS tablet Take 1,000 Units by mouth daily.   Yes [provider]  gabapentin (NEURONTIN) 100 MG capsule Take 1 capsule (100 mg total) by mouth 3 (three) times daily. 11/24/19  Yes Ngetich, Dinah C, NP  metoprolol succinate (TOPROL-XL) 25 MG 24 hr tablet Take 1 tablet (25 mg total) by mouth daily. 11/24/19  Yes Ngetich, Dinah C, NP  moxifloxacin (VIGAMOX) 0.5 % ophthalmic solution Place 1 drop into the left eye 4 (four) times daily. Course for 10 days began 12/13/19  12/13/19 12/23/19 Yes [provider]  Multiple Vitamin (MULTIVITAMIN WITH MINERALS) TABS Take 1 tablet by mouth daily.   Yes [provider]  MYRBETRIQ 50 MG TB24 tablet Take 1 tablet (50 mg total) by mouth daily. Patient taking differently: Take 50 mg by mouth at bedtime.  11/24/19  Yes Ngetich, Dinah C, NP  OVER THE COUNTER MEDICATION Take 120 mLs by mouth in the morning and at bedtime. Med pass 2.0   Yes [provider]  amLODipine (NORVASC) 5 MG tablet Take 0.5 tablets (2.5 mg total) by mouth daily. Patient not taking: Reported on 12/21/2019 11/24/19   Ngetich, Dinah C, NP  cephALEXin (KEFLEX) 500 MG capsule Take 500 mg by mouth 2 (two) times daily. Pt completed 7 day course on 12/20/19    [provider]  magnesium hydroxide (MILK OF MAGNESIA) 400 MG/5ML suspension Take 30 mLs by mouth daily as needed for mild constipation (x 48  hours,if no relief,add miralax by mouth daily for 3 days.).    [provider]  polyethylene glycol (MIRALAX / GLYCOLAX) 17 g packet Take 17 g by mouth daily as needed for mild constipation (for 3 days,if no relief by milk of mag).    [provider]    Physical Exam: Vitals:   12/21/19 1330 12/21/19 1345 12/21/19 1400 12/21/19 1450  BP: 135/69 135/69    Pulse: 76 81    Resp: 15 (!) 21    Temp:      TempSrc:      SpO2: 100% 100%  100%  Weight:   49.4 kg   Height:   5\' 6"  (1.676 m)     Constitutional: NAD, calm, comfortable Vitals:   12/21/19 1330 12/21/19 1345 12/21/19 1400 12/21/19 1450  BP:  135/69 135/69    Pulse: 76 81    Resp: 15 (!) 21    Temp:      TempSrc:      SpO2: 100% 100%  100%  Weight:   49.4 kg   Height:   5\' 6"  (1.676 m)    Eyes: PERRL, lids and conjunctivae normal ENMT: Mucous membranes are moist. Posterior pharynx clear of any exudate or lesions.Normal dentition.  Neck: normal, supple, no masses, no thyromegaly. JVD 6-7 cm above clavicle Respiratory: Diminished breathing bilaterally, diffused bilateral fine crackles, scattered wheezing.  Increased respiratory effort. No accessory muscle use.  Cardiovascular: Regular rate and rhythm, no murmurs / rubs / gallops. No extremity edema. 2+ pedal pulses. No carotid bruits.  Abdomen: no tenderness, no masses palpated. No hepatosplenomegaly. Bowel sounds positive.  Musculoskeletal: no clubbing / cyanosis. No joint deformity upper and lower extremities. Good ROM, no contractures. Normal muscle tone.  Skin: no rashes, lesions, ulcers. No induration Neurologic: CN 2-12 grossly intact. Sensation intact, DTR normal. Strength 5/5 in all 4.  Psychiatric: Normal judgment and insight. Alert and oriented x 3. Normal mood.    Labs on Admission: I have personally reviewed following labs and imaging studies  CBC: Recent Labs  Lab 12/21/19 1050  WBC 8.0  NEUTROABS 6.3  HGB 12.1  HCT 38.6  MCV 98.0  PLT  703   Basic Metabolic Panel: Recent Labs  Lab 12/21/19 1309  NA 136  K 5.2*  CL 99  CO2 29  GLUCOSE 99  BUN 29*  CREATININE 1.11*  CALCIUM 10.1   GFR: Estimated Creatinine Clearance: 18.9 mL/min (A) (by C-G formula based on SCr of 1.11 mg/dL (H)). Liver Function Tests: No results for input(s): AST, ALT, ALKPHOS, BILITOT, PROT, ALBUMIN in the last 168 hours. No results for input(s): LIPASE, AMYLASE in the last 168 hours. No results for input(s): AMMONIA in the last 168 hours. Coagulation Profile: No results for input(s): INR, PROTIME in the last 168 hours. Cardiac Enzymes: No results for input(s): CKTOTAL, CKMB, CKMBINDEX, TROPONINI in the last 168 hours. BNP (last 3 results) No results for input(s): PROBNP in the last 8760 hours. HbA1C: No results for input(s): HGBA1C in the last 72 hours. CBG: No results for input(s): GLUCAP in the last 168 hours. Lipid Profile: No results for input(s): CHOL, HDL, LDLCALC, TRIG, CHOLHDL, LDLDIRECT in the last 72 hours. Thyroid Function Tests: No results for input(s): TSH, T4TOTAL, FREET4, T3FREE, THYROIDAB in the last 72 hours. Anemia Panel: No results for input(s): VITAMINB12, FOLATE, FERRITIN, TIBC, IRON, RETICCTPCT in the last 72 hours. Urine analysis:    Component Value Date/Time   COLORURINE YELLOW 11/09/2015 1036   APPEARANCEUR CLEAR 11/09/2015 1036   LABSPEC 1.014 11/09/2015 1036   PHURINE 6.5 11/09/2015 1036   GLUCOSEU NEGATIVE 11/09/2015 1036   HGBUR NEGATIVE 11/09/2015 1036   BILIRUBINUR NEGATIVE 11/09/2015 1036   KETONESUR NEGATIVE 11/09/2015 1036   PROTEINUR NEGATIVE 11/09/2015 1036   UROBILINOGEN 0.2 05/03/2011 1950   NITRITE NEGATIVE 11/09/2015 1036   LEUKOCYTESUR NEGATIVE 11/09/2015 1036    Radiological Exams on Admission: DG Chest Port 1 View  Result Date: 12/21/2019 CLINICAL DATA:  Aspiration, shortness of breath. EXAM: PORTABLE CHEST 1 VIEW COMPARISON:  Prior chest radiographs 11/14/2019 and earlier.  FINDINGS: Redemonstrated left chest dual lead implantable cardiac device. Unchanged cardiomegaly. Aortic atherosclerosis. Bilateral airspace opacities, greatest within the right mid lung and right lung base. No sizable pleural effusion or evidence of pneumothorax. No acute bony abnormality is identified. Advanced  degenerative changes of the left glenohumeral joint. IMPRESSION: Bilateral airspace opacities greatest within the right mid lung and right lung base. Findings may reflect pneumonia and/or edema. Unchanged cardiomegaly. Aortic Atherosclerosis (ICD10-I70.0). Electronically Signed   By: Kellie Simmering DO   On: 12/21/2019 10:31    EKG: Independently reviewed.  Ventricular paced  Assessment/Plan Active Problems:   Acute CHF (congestive heart failure) (HCC)   CHF (congestive heart failure) ([place])  (please populate well all problems here in Problem List. (For example, if patient is on BP meds at home and you resume or decide to hold them, it is a problem that needs to be her. Same for CAD, COPD, HLD and so on)  Acute hypoxic respite failure -Probably multifactorial, main issue appears to be acute on chronic diastolic CHF decompensation.  Blood pressure systolic in the 665L, but given the patient's significant advanced diastolic dysfunction, and physical exam showed signs of fluid overload, will give IV Lasix. -Given the significant aspiration event, de-escalate antibiotics to Unasyn, consult pharmacy, discontinue vancomycin and cefepime. -Another significant factor, might be worsening of baseline pulmonary fibrosis, which was at least as early as 2015, son reported the patient worked in the [employer] for her living before retirement, was never formally diagnosed. -We have ordered a CT chest to characterize infiltrates in the lungs. -Treatment wise, Lasix, antibiotics, short course steroid and breathing treatment.  Hypokalemia -Lasix and recheck  Aspiration and question of  dysphagia -Consult PT -Currently, there is no indication for feeding tube.  Acute on chronic diastolic CHF decompensation -As above, probably can be discharged back to nursing home on as needed Lasix instead of standing dose.  If concerned about overdiuresis/dehydration.    DVT prophylaxis: Heparin subcu  code Status: DNR Family Communication: Son at bedside Disposition Plan: Patient sick with medical medical conditions of CHF and pneumonia, expect more than 2 midnight hospital stay Consults called: None Admission status: Tele admit   Lequita Halt MD [facility] Pager [phone]  12/21/2019, 3:04 PM

## 2019-12-21 NOTE — ED Notes (Signed)
Pt sats dropped to low to mid 80's after CT.  Pt head was at about 30 degrees.  Pt set up to about 80 degrees and sats returned to 95%

## 2019-12-21 NOTE — Progress Notes (Signed)
Pharmacy Antibiotic Note  Julie Mcpherson is a 86 y.o. female admitted on 12/21/2019 with pneumonia.  Pharmacy has been consulted for Unasyn dosing.  Height: 5\' 6"  (167.6 cm) Weight: 49.4 kg (108 lb 14.5 oz) IBW/kg (Calculated) : 59.3  Temp (24hrs), Avg:97 F (36.1 C), Min:97 F (36.1 C), Max:97 F (36.1 C)  Recent Labs  Lab 12/21/19 1050 12/21/19 1309  WBC 8.0  --   CREATININE  --  1.11*    Estimated Creatinine Clearance: 18.9 mL/min (A) (by C-G formula based on SCr of 1.11 mg/dL (H)).    Allergies  Allergen Reactions  . Codeine Other (See Comments)    "can't sleep; I climb the walls"  . Statins Other (See Comments)    "ocular migraine headaches"  . Sulfa Antibiotics Anaphylaxis, Itching and Rash  . Other     Adsorbed-listed on MAR no reaction listed  . Tetanus Toxoids Other (See Comments)    "think it started turning red around where I got shot; they gave me small doses and I did fine"    Antimicrobials this admission: 11/17 Unasyn >>   Dose adjustments this admission: N/a  Microbiology results: Pending  Plan:  - Start Unasyn 3g IV q12h  - Monitor patients renal function and urine output  Thank you for allowing pharmacy to be a part of this patient's care.  Duanne Limerick PharmD. BCPS  12/21/2019 3:01 PM

## 2019-12-22 ENCOUNTER — Inpatient Hospital Stay (HOSPITAL_COMMUNITY): Payer: Medicare Other

## 2019-12-22 DIAGNOSIS — J9601 Acute respiratory failure with hypoxia: Secondary | ICD-10-CM | POA: Diagnosis not present

## 2019-12-22 LAB — BASIC METABOLIC PANEL
Anion gap: 10 (ref 5–15)
BUN: 29 mg/dL — ABNORMAL HIGH (ref 8–23)
CO2: 32 mmol/L (ref 22–32)
Calcium: 9.5 mg/dL (ref 8.9–10.3)
Chloride: 96 mmol/L — ABNORMAL LOW (ref 98–111)
Creatinine, Ser: 1.17 mg/dL — ABNORMAL HIGH (ref 0.44–1.00)
GFR, Estimated: 41 mL/min — ABNORMAL LOW (ref >60)
Glucose, Bld: 100 mg/dL — ABNORMAL HIGH (ref 70–99)
Potassium: 5.4 mmol/L — ABNORMAL HIGH (ref 3.5–5.1)
Sodium: 138 mmol/L (ref 135–145)

## 2019-12-22 LAB — PROCALCITONIN: Procalcitonin: 0.14 ng/mL

## 2019-12-22 LAB — MRSA PCR SCREENING: MRSA by PCR: NEGATIVE

## 2019-12-22 MED ORDER — RESOURCE THICKENUP CLEAR PO POWD
ORAL | Status: DC | PRN
  Filled 2019-12-22: qty 125

## 2019-12-22 MED ORDER — FUROSEMIDE 10 MG/ML IJ SOLN
20.0000 mg | Freq: Two times a day (BID) | INTRAMUSCULAR | Status: DC
  Administered 2019-12-22 – 2019-12-26 (×9): 20 mg via INTRAVENOUS
  Filled 2019-12-22 (×9): qty 2

## 2019-12-22 MED ORDER — COLLAGENASE 250 UNIT/GM EX OINT
TOPICAL_OINTMENT | Freq: Every day | CUTANEOUS | Status: DC
  Filled 2019-12-22: qty 30

## 2019-12-22 NOTE — Plan of Care (Signed)
  Problem: Clinical Measurements: Goal: Respiratory complications will improve Outcome: Progressing   

## 2019-12-22 NOTE — Consult Note (Signed)
WOC Nurse Consult Note: Reason for Consult: heel and leg wound Patient from SNF, she is sleeping but awakens easily Wound type:  1. Stage 3 Pressure Injury: left heel 2. Stage 3 Pressure injury: right heel  3. Ecchymosis right pretibial  Pressure Injury POA: Yes Measurement: 1. 2.5cm x 1.7cm x 0.2cm  2. 0.7cm x 1.5cm x 0.1cm  3. 15cm x 5cm x 0cm  Wound bed: 1. 20% black soft non viable tissue 2. 100% pink 3. Purple intact skin Drainage (amount, consistency, odor) scant both sites, no odor Periwound:intact Dressing procedure/placement/frequency: Enzymatic debridement ointment to the left heel; top with saline moist dressing, change daily.  Silicone foam to the right heel; change every 3 days Prevalon boots bilaterally for better offloading of the heels.   Discussed POC with patient and bedside nurse.  Re consult if needed, will not follow at this time. Thanks  Patrick Salemi [employer], RN,CWOCN, CNS, [place] [phone])

## 2019-12-22 NOTE — Progress Notes (Signed)
Modified Barium Swallow Progress Note  Patient Details  Name: Julie Mcpherson MRN: [id number] Date of Birth: Apr 25, 1913  Today's Date: 12/22/2019  Modified Barium Swallow completed.  Full report located under Chart Review in the Imaging Section.  Brief recommendations include the following:  Clinical Impression  Pt presented with pharyngeal dysphagia characterized by reduced lingual retraction, a pharyngeal delay, and reduced anterior laryngeal movement. She demonstrated vallecular residue, pyriform sinus residue, penetration (PAS 3) and silent aspiration (PAS 8) of thin liquids, and nectar thick liquids via cup and straw. Pt's cough was weak and prompted coughing was ineffective in expelling penetrated/aspirated material. Amount of residue increased with advancement of solid consistencies. Postural modifications were attempted, but pt was unable to consistently demonstrate these. A dysphagia 2 diet with honey thick liquids is recommended at this time. SLP will follow for dysphagia treatment.    Swallow Evaluation Recommendations       SLP Diet Recommendations: Dysphagia 2 (Fine chop) solids;Honey thick liquids   Liquid Administration via: Cup;Straw   Medication Administration: Crushed with puree   Supervision: Full assist for feeding   Compensations: Slow rate;Small sips/bites   Postural Changes: Remain semi-upright after after feeds/meals (Comment);Seated upright at 90 degrees   Oral Care Recommendations: Oral care BID      Denys Labree I. Hardin Negus, [place], [place] Office number [phone] Pager [phone]  Horton Marshall 12/22/2019,4:44 PM

## 2019-12-22 NOTE — Evaluation (Signed)
Clinical/Bedside Swallow Evaluation Patient Details  Name: Julie Mcpherson MRN: [id number] Date of Birth: 1913-08-19  Today's Date: 12/22/2019 Time: SLP Start Time (ACUTE ONLY): 0858 SLP Stop Time (ACUTE ONLY): 0915 SLP Time Calculation (min) (ACUTE ONLY): 17 min  Past Medical History:  Past Medical History:  Diagnosis Date  . Arthritis   . Atrial fibrillation ([place])   . Blood transfusion   . Chronic kidney disease    stage 3 renal disease  . Diverticular disease   . DJD (degenerative joint disease)   . DJD (degenerative joint disease)   . DVT (deep venous thrombosis) ([place]) early 1980's   left medial upper thigh  . Fracture, shoulder ~ 2003; 1997   left; right  . GERD (gastroesophageal reflux disease)   . High cholesterol   . High cholesterol   . Hypertension   . Overactive bladder   . Pacemaker   . Skin cancer of face   . Skin cancer of face   . Syncope secondary to post termination pauses 05/03/11   "passed out; first time ever"  . Urinary frequency    Past Surgical History:  Past Surgical History:  Procedure Laterality Date  . APPENDECTOMY  ~ 1938  . BLADDER SUSPENSION  1985  . CARPAL TUNNEL RELEASE  04/18/2011   right; Procedure: CARPAL TUNNEL RELEASE;  Surgeon: Cammie Sickle., MD;  Location: [facility];  Service: Orthopedics;  Laterality: Right;  . CARPAL TUNNEL RELEASE  12/05/2011   Procedure: CARPAL TUNNEL RELEASE;  Surgeon: Cammie Sickle., MD;  Location: [facility];  Service: Orthopedics;  Laterality: Left;  . CATARACT EXTRACTION W/ INTRAOCULAR LENS  IMPLANT, BILATERAL    . HERNIA REPAIR  1986   "stomach"  . JOINT REPLACEMENT     hips  . PERMANENT PACEMAKER INSERTION N/A 05/06/2011   Procedure: PERMANENT PACEMAKER INSERTION;  Surgeon: Deboraha Sprang, MD;  Location: [facility] CATH LAB;  Service: Cardiovascular;  Laterality: N/A;  . SKIN CANCER EXCISION  2012   "twice"  . TEMPORARY PACEMAKER INSERTION N/A 05/05/2011   Procedure:  TEMPORARY PACEMAKER INSERTION;  Surgeon: Clent Demark, MD;  Location: [facility] CATH LAB;  Service: Cardiovascular;  Laterality: N/A;  . TOTAL HIP ARTHROPLASTY     "2 on right; 1 on left"   HPI:  Pt is a 86 y.o. female with medical history significant of chronic diastolic CHF, LVEF 55 to 96% to third-degree diastolic dysfunction 75/9163, sick sinus syndrome status post PPM, chronic A. fib not on anticoagulation, HTN, CKD stage III, hearing impairment, who presented with increasing shortness of breath. Son reported patient has had " aspiration pneumonia 2 days ago". Son reported patient has had " aspiration pneumonia 2 days ago". CT chest: Cardiomegaly. Fused interstitial thickening and ground-glass opacities may reflect mild interstitial edema.    Assessment / Plan / Recommendation Clinical Impression  Pt reported that she "gets choked" on everything and she denied any variability with consistencies. She presented with symptoms of pharyngeal dysphagia characterized by 3-4 secondary swallows per solid and liquid boluses and throat clearing and/or coughing with all consistencies. Pt also demonstrated eructation with liquid boluses suggesting possible esophageal involvement. It is recommended that her NPO status be maintained, but any critical meds may be given crushed with applesauce. A modified barium swallow study is recommended to further assess physiology and determine safety of p.o. intake.  SLP Visit Diagnosis: Dysphagia, unspecified (R13.10)    Aspiration Risk       Diet  Recommendation NPO   Medication Administration: Crushed with puree    Other  Recommendations Oral Care Recommendations: Oral care QID   Follow up Recommendations  (TBD)      Frequency and Duration            Prognosis Prognosis for Safe Diet Advancement: Fair Barriers to Reach Goals: Severity of deficits      Swallow Study   General Date of Onset: 12/21/19 HPI: Pt is a 86 y.o. female with medical history  significant of chronic diastolic CHF, LVEF 55 to 27% to third-degree diastolic dysfunction 61/4709, sick sinus syndrome status post PPM, chronic A. fib not on anticoagulation, HTN, CKD stage III, hearing impairment, who presented with increasing shortness of breath. Son reported patient has had " aspiration pneumonia 2 days ago". Son reported patient has had " aspiration pneumonia 2 days ago". CT chest: Cardiomegaly. Fused interstitial thickening and ground-glass opacities may reflect mild interstitial edema.  Type of Study: Bedside Swallow Evaluation Previous Swallow Assessment: None Diet Prior to this Study: NPO Temperature Spikes Noted: No Respiratory Status: Nasal cannula History of Recent Intubation: No Behavior/Cognition: Alert;Cooperative;Pleasant mood Oral Cavity Assessment: Within Functional Limits Oral Care Completed by SLP: No Oral Cavity - Dentition: Adequate natural dentition Vision: Functional for self-feeding Self-Feeding Abilities: Able to feed self Patient Positioning: Upright in bed;Postural control adequate for testing Baseline Vocal Quality: Normal Volitional Cough: Strong Volitional Swallow: Able to elicit    Oral/Motor/Sensory Function Overall Oral Motor/Sensory Function: Other (comment) (Pt unable to follow all the necessary commands. )   Ice Chips Ice chips: Not tested   Thin Liquid Thin Liquid: Impaired Presentation: Straw;Spoon;Cup Pharyngeal  Phase Impairments: Throat Clearing - Immediate;Cough - Delayed    Nectar Thick Nectar Thick Liquid: Not tested   Honey Thick Honey Thick Liquid: Not tested   Puree Puree: Impaired Presentation: Spoon Pharyngeal Phase Impairments: Throat Clearing - Immediate;Multiple swallows   Solid     Solid: Impaired Presentation: Self Fed Pharyngeal Phase Impairments: Multiple swallows;Throat Clearing - Immediate     Misha Vanoverbeke I. Hardin Negus, [place], [place] Office number [phone] Pager  [phone]  Horton Marshall 12/22/2019,9:25 AM

## 2019-12-22 NOTE — Progress Notes (Signed)
PROGRESS NOTE    Julie Mcpherson  GNF:[id number] DOB: [birth year] DOA: 12/21/2019 PCP: Lajean Manes, MD   Brief Narrative:   Julie Mcpherson is a 86 y.o. female with medical history significant of chronic diastolic CHF, LVEF 55 to 84% to third-degree diastolic dysfunction 69/6295, sick sinus syndrome status post PPM, chronic A. fib not on anticoagulation, HTN, CKD stage III, hearing impairment, presented with increasing shortness of breath.  Patient has significant heart rate 1, most history provided by patient's son over at bedside.  Son reported patient has had " aspiration pneumonia 2 days ago" was placed on p.o. antibiotics, main symptoms appears to be increasing shortness of breath.  Denied any fever chills.  Patient had pneumonia before, but not recent years. Patient was hospitalized 1 month ago for fluid overload, and was treated with IV Lasix which resolved.  Patient was discharged back, and not on routine diuresis because the patient age and other comorbidities. In ED patient stabilized on 3 L, but very quickly desats with minimal exertion.  Chest x-ray showed bilateral lower field infiltrate suspect for pneumonia versus CHF decompensation.  Vancomycin and cefepime were given in the ED.  WBC 8.0, creatinine 1.1, potassium 5.2.  Assessment & Plan:   Principal Problem:   Acute respiratory failure with hypoxia (HCC) Active Problems:   Essential hypertension   A-fib (HCC)   Pressure injury of skin   Goals of care, counseling/discussion   Chronic kidney disease, stage 3b (HCC)   CHF (congestive heart failure) (HCC)   Acute hypoxic respiratory failure in the setting of aspiration pneumonitis vs pneumonia, POA - Patient complains about essentially chronic choking with both solids and liquids -Speech following -Chest x-ray and CT concerning for underlying fibrosis, likely chronic, history of working in textiles -Acute aspect continues to be likely aspiration -Patient appears  euvolemic, heart failure exacerbation less likely given markedly poor p.o. intake over the past few days due to swallowing difficulty  Hyperkalemia -Likely somewhat hemoconcentration given poor p.o. intake, continue Lasix Lab Results  Component Value Date   K 5.4 (H) 12/22/2019   Questionably acute on chronic diastolic CHF decompensation -As above, probably can be discharged back to nursing home on as needed Lasix instead of standing dose.  If concerned about overdiuresis/dehydration.   DVT prophylaxis: Heparin subcu code Status: DNR Family Communication: Son over the phone - will update the rest of the family  Status is: Inpt  Dispo: The patient is from: [facility] independent living              Anticipated d/c is to: Same              Anticipated d/c date is: 48 to 72 hours              Patient currently not medically stable for discharge given ongoing need for IV antibiotics, evaluation with speech to ensure ability to take p.o. safely prior to discharge  Consultants:   Speech  Procedures:   None  Antimicrobials:  Unasyn  Subjective: No acute issues or events overnight denies chest pain, headache, fever, chills.  She does report ongoing choking sensation when attempting to take p.o. as well as shortness of breath worse with exertion  Objective: Vitals:   12/22/19 0500 12/22/19 0755 12/22/19 1135 12/22/19 1403  BP: (!) 154/71   132/67  Pulse: 89   85  Resp: 18     Temp:  97.6 F (36.4 C) 97.6 F (36.4 C)   TempSrc:  Oral Oral   SpO2: 98%   96%  Weight:      Height:        Intake/Output Summary (Last 24 hours) at 12/22/2019 1507 Last data filed at 12/22/2019 1258 Gross per 24 hour  Intake 100 ml  Output 1600 ml  Net -1500 ml   Filed Weights   12/21/19 1400 12/21/19 1825 12/22/19 0455  Weight: 49.4 kg 56.9 kg 56 kg    Examination:  General:  Pleasantly resting in bed, No acute distress. HEENT:  Normocephalic atraumatic.  Sclerae nonicteric,  noninjected.  Extraocular movements intact bilaterally. Neck:  Without mass or deformity.  Trachea is midline. Lungs:  Clear to auscultate bilaterally without rhonchi, wheeze, or rales. Heart:  Regular rate and rhythm.  Without murmurs, rubs, or gallops. Abdomen:  Soft, nontender, nondistended.  Without guarding or rebound. Extremities: Without cyanosis, clubbing, edema, or obvious deformity. Vascular:  Dorsalis pedis and posterior tibial pulses palpable bilaterally. Skin:  Warm and dry, no erythema, no ulcerations.  Data Reviewed: I have personally reviewed following labs and imaging studies  CBC: Recent Labs  Lab 12/21/19 1050  WBC 8.0  NEUTROABS 6.3  HGB 12.1  HCT 38.6  MCV 98.0  PLT 952   Basic Metabolic Panel: Recent Labs  Lab 12/21/19 1309 12/22/19 0345  NA 136 138  K 5.2* 5.4*  CL 99 96*  CO2 29 32  GLUCOSE 99 100*  BUN 29* 29*  CREATININE 1.11* 1.17*  CALCIUM 10.1 9.5   GFR: Estimated Creatinine Clearance: 20.3 mL/min (A) (by C-G formula based on SCr of 1.17 mg/dL (H)). Liver Function Tests: No results for input(s): AST, ALT, ALKPHOS, BILITOT, PROT, ALBUMIN in the last 168 hours. No results for input(s): LIPASE, AMYLASE in the last 168 hours. No results for input(s): AMMONIA in the last 168 hours. Coagulation Profile: No results for input(s): INR, PROTIME in the last 168 hours. Cardiac Enzymes: No results for input(s): CKTOTAL, CKMB, CKMBINDEX, TROPONINI in the last 168 hours. BNP (last 3 results) No results for input(s): PROBNP in the last 8760 hours. HbA1C: No results for input(s): HGBA1C in the last 72 hours. CBG: No results for input(s): GLUCAP in the last 168 hours. Lipid Profile: No results for input(s): CHOL, HDL, LDLCALC, TRIG, CHOLHDL, LDLDIRECT in the last 72 hours. Thyroid Function Tests: No results for input(s): TSH, T4TOTAL, FREET4, T3FREE, THYROIDAB in the last 72 hours. Anemia Panel: No results for input(s): VITAMINB12, FOLATE, FERRITIN,  TIBC, IRON, RETICCTPCT in the last 72 hours. Sepsis Labs: Recent Labs  Lab 12/21/19 1520 12/22/19 0345  PROCALCITON 0.11 0.14    Recent Results (from the past 240 hour(s))  Respiratory Panel by RT PCR (Flu A&B, Covid) - Nasopharyngeal Swab     Status: None   Collection Time: 12/21/19 10:50 AM   Specimen: Nasopharyngeal Swab  Result Value Ref Range Status   SARS Coronavirus 2 by RT PCR NEGATIVE NEGATIVE Final    Comment: (NOTE) SARS-CoV-2 target nucleic acids are NOT DETECTED.  The SARS-CoV-2 RNA is generally detectable in upper respiratoy specimens during the acute phase of infection. The lowest concentration of SARS-CoV-2 viral copies this assay can detect is 131 copies/mL. A negative result does not preclude SARS-Cov-2 infection and should not be used as the sole basis for treatment or other patient management decisions. A negative result may occur with  improper specimen collection/handling, submission of specimen other than nasopharyngeal swab, presence of viral mutation(s) within the areas targeted by this assay, and inadequate number of viral  copies (<131 copies/mL). A negative result must be combined with clinical observations, patient history, and epidemiological information. The expected result is Negative.  Fact Sheet for Patients:  PinkCheek.be  Fact Sheet for Healthcare Providers:  GravelBags.it  This test is no t yet approved or cleared by the [place] FDA and  has been authorized for detection and/or diagnosis of SARS-CoV-2 by FDA under an Emergency Use Authorization (EUA). This EUA will remain  in effect (meaning this test can be used) for the duration of the COVID-19 declaration under Section 564(b)(1) of the Act, 21 U.S.C. section 360bbb-3(b)(1), unless the authorization is terminated or revoked sooner.     Influenza A by PCR NEGATIVE NEGATIVE Final   Influenza B by PCR NEGATIVE NEGATIVE  Final    Comment: (NOTE) The Xpert Xpress SARS-CoV-2/FLU/RSV assay is intended as an aid in  the diagnosis of influenza from Nasopharyngeal swab specimens and  should not be used as a sole basis for treatment. Nasal washings and  aspirates are unacceptable for Xpert Xpress SARS-CoV-2/FLU/RSV  testing.  Fact Sheet for Patients: PinkCheek.be  Fact Sheet for Healthcare Providers: GravelBags.it  This test is not yet approved or cleared by the [place] FDA and  has been authorized for detection and/or diagnosis of SARS-CoV-2 by  FDA under an Emergency Use Authorization (EUA). This EUA will remain  in effect (meaning this test can be used) for the duration of the  Covid-19 declaration under Section 564(b)(1) of the Act, 21  U.S.C. section 360bbb-3(b)(1), unless the authorization is  terminated or revoked. Performed at [facility], [place] [address] [place], [place] [address]   MRSA PCR Screening     Status: None   Collection Time: 12/22/19  5:16 AM   Specimen: Nasopharyngeal  Result Value Ref Range Status   MRSA by PCR NEGATIVE NEGATIVE Final    Comment:        The GeneXpert MRSA Assay (FDA approved for NASAL specimens only), is one component of a comprehensive MRSA colonization surveillance program. It is not intended to diagnose MRSA infection nor to guide or monitor treatment for MRSA infections. Performed at [facility], [place] [address], [place], [place] [address]          Radiology Studies: CT CHEST WO CONTRAST  Result Date: 12/21/2019 CLINICAL DATA:  Cardiomegaly, follow-up EXAM: CT CHEST WITHOUT CONTRAST TECHNIQUE: Multidetector CT imaging of the chest was performed following the standard protocol without IV contrast. COMPARISON:  Chest x-ray today FINDINGS: Cardiovascular: Cardiomegaly. Pacer wires noted in the right heart. Densely calcified mitral valve annulus and aorta. Scattered coronary  artery calcifications. Mediastinum/Nodes: Borderline sized mediastinal lymph nodes. Right paratracheal node has a short axis diameter of 10 mm. No axillary adenopathy. Lungs/Pleura: Areas of peripheral interstitial thickening most pronounced in the mid and lower lung zones, likely fibrosis. Small bilateral pleural effusions. Diffuse interstitial thickening and ground-glass opacities within the lungs may reflect edema. Upper Abdomen: Aortic atherosclerosis. Bilateral renal cysts. No hydronephrosis. Scattered colonic diverticula in the visualized transverse colon and descending colon. No acute findings. Musculoskeletal: Chest wall soft tissues are unremarkable. No acute bony abnormality. IMPRESSION: Cardiomegaly. Fused interstitial thickening and ground-glass opacities may reflect mild interstitial edema. Small bilateral pleural effusions. Findings likely superimposed on chronic lung disease/fibrosis. Coronary artery disease. Aortic Atherosclerosis (ICD10-I70.0). Electronically Signed   By: Rolm Baptise M.D.   On: 12/21/2019 17:15   DG Chest Port 1 View  Result Date: 12/21/2019 CLINICAL DATA:  Aspiration, shortness of breath. EXAM:  PORTABLE CHEST 1 VIEW COMPARISON:  Prior chest radiographs 11/14/2019 and earlier. FINDINGS: Redemonstrated left chest dual lead implantable cardiac device. Unchanged cardiomegaly. Aortic atherosclerosis. Bilateral airspace opacities, greatest within the right mid lung and right lung base. No sizable pleural effusion or evidence of pneumothorax. No acute bony abnormality is identified. Advanced degenerative changes of the left glenohumeral joint. IMPRESSION: Bilateral airspace opacities greatest within the right mid lung and right lung base. Findings may reflect pneumonia and/or edema. Unchanged cardiomegaly. Aortic Atherosclerosis (ICD10-I70.0). Electronically Signed   By: Kellie Simmering DO   On: 12/21/2019 10:31        Scheduled Meds:  acetaminophen  500 mg Oral BID    amLODipine  2.5 mg Oral Daily   budesonide (PULMICORT) nebulizer solution  0.25 mg Nebulization BID   calcium carbonate  625 mg Oral Q breakfast   cholecalciferol  1,000 Units Oral Daily   collagenase   Topical Daily   furosemide  20 mg Intravenous BID   gabapentin  100 mg Oral TID   gatifloxacin  1 drop Left Eye QID   heparin  5,000 Units Subcutaneous Q12H   ipratropium  0.5 mg Nebulization BID   metoprolol succinate  25 mg Oral Daily   mirabegron ER  50 mg Oral QHS   multivitamin with minerals  1 tablet Oral Daily   prednisoLONE  20 mg Oral Daily   sodium chloride flush  3 mL Intravenous Q12H   Continuous Infusions:  sodium chloride     ampicillin-sulbactam (UNASYN) IV 3 g (12/22/19 0509)     LOS: 1 day   Time spent: 15min  Tracey Stewart C Deshayla Empson, DO [facility]  If 7PM-7AM, please contact night-coverage [email or web address]  12/22/2019, 3:07 PM

## 2019-12-22 NOTE — Evaluation (Signed)
Physical Therapy Evaluation Patient Details Name: Julie Mcpherson MRN: [id number] DOB: 06-13-1913 Today's Date: 12/22/2019   History of Present Illness  Patient is a 86 year old female with history of congestive heart failure, chronic renal insufficiency, atrial fibrillation, pacemaker.  She presents today for evaluation of shortness of breath.  She was sent from her extended care facility after they found her oxygen saturations to be low and patient to have audible wheezing  Clinical Impression  Pt admitted with SOB. Unable to fully assess PLOF. Pt states she has assist for transfers from bed to chair, pt states she does not walk. Pt with limited participation in transfer requiring max A from therapist for squat pivot transfer. Pt will benefit from skilled PT to address deficits in balance, strength, coordination, transfer, bed mobility and safety to maximize independence with functional mobility prior to discharge.     Follow Up Recommendations SNF vs back to [facility] if facility is able to provide that level of assist    Equipment Recommendations  None recommended by PT; possible lift pt wants to return to [facility] and they do not have the appropriate equipment or are not able to provide necessary level of assist    Recommendations for Other Services       Precautions / Restrictions Precautions Precautions: Fall Precaution Comments: prevalon boot      Mobility  Bed Mobility Overal bed mobility: Needs Assistance Bed Mobility: Supine to Sit     Supine to sit: HOB elevated;Mod assist          Transfers Overall transfer level: Needs assistance   Transfers: Squat Pivot Transfers     Squat pivot transfers: Max assist     General transfer comment: pt with little participation in transfer  Ambulation/Gait             General Gait Details: pt does not ambulate  Stairs            Wheelchair Mobility    Modified Rankin (Stroke Patients Only)        Balance Overall balance assessment: Needs assistance Sitting-balance support: Single extremity supported;Feet supported Sitting balance-Leahy Scale: Poor         Standing balance comment: pt does not stand                             Pertinent Vitals/Pain Pain Assessment: No/denies pain    Home Living Family/patient expects to be discharged to:: Assisted living               Home Equipment: Electric scooter;Shower seat;Other (comment) (bedrails)      Prior Function Level of Independence: Needs assistance   Gait / Transfers Assistance Needed: reports transfers only, uses electric scooter for mobility   ADL's / Homemaking Assistance Needed: unable to determine; pt HOH and states she has someone transfer her from the bed to a lift chair and back        Hand Dominance   Dominant Hand: Right    Extremity/Trunk Assessment   Upper Extremity Assessment Upper Extremity Assessment: Generalized weakness    Lower Extremity Assessment Lower Extremity Assessment: Generalized weakness    Cervical / Trunk Assessment Cervical / Trunk Assessment: Kyphotic  Communication   Communication: HOH  Cognition Arousal/Alertness: Awake/alert Behavior During Therapy: WFL for tasks assessed/performed Overall Cognitive Status: No family/caregiver present to determine baseline cognitive functioning  General Comments      Exercises     Assessment/Plan    PT Assessment Patient needs continued PT services  PT Problem List Decreased strength;Decreased mobility;Decreased safety awareness;Decreased coordination;Decreased activity tolerance;Decreased balance       PT Treatment Interventions Therapeutic exercise;Balance training;Functional mobility training;Therapeutic activities;Patient/family education    PT Goals (Current goals can be found in the Care Plan section)  Acute Rehab PT Goals Patient Stated Goal: I  want to eat PT Goal Formulation: With patient Time For Goal Achievement: 01/05/20 Potential to Achieve Goals: Fair    Frequency Min 2X/week   Barriers to discharge        Co-evaluation               AM-PAC PT "6 Clicks" Mobility  Outcome Measure Help needed turning from your back to your side while in a flat bed without using bedrails?: A Little Help needed moving from lying on your back to sitting on the side of a flat bed without using bedrails?: A Lot Help needed moving to and from a bed to a chair (including a wheelchair)?: A Lot Help needed standing up from a chair using your arms (e.g., wheelchair or bedside chair)?: Total Help needed to walk in hospital room?: Total Help needed climbing 3-5 steps with a railing? : Total 6 Click Score: 10    End of Session Equipment Utilized During Treatment: Gait belt;Oxygen Activity Tolerance: Patient limited by fatigue Patient left: in chair;with call bell/phone within reach;with chair alarm set Nurse Communication: Mobility status PT Visit Diagnosis: Unsteadiness on feet (R26.81);Muscle weakness (generalized) (M62.81)    Time: 0076-2263 PT Time Calculation (min) (ACUTE ONLY): 18 min   Charges:   PT Evaluation $PT Eval Low Complexity: 1 Low          Lyanne Co, DPT Acute Rehabilitation Services [phone]  [place] 12/22/2019, 2:11 PM

## 2019-12-23 DIAGNOSIS — E440 Moderate protein-calorie malnutrition: Secondary | ICD-10-CM | POA: Insufficient documentation

## 2019-12-23 DIAGNOSIS — J9601 Acute respiratory failure with hypoxia: Secondary | ICD-10-CM | POA: Diagnosis not present

## 2019-12-23 LAB — BASIC METABOLIC PANEL
Anion gap: 8 (ref 5–15)
BUN: 32 mg/dL — ABNORMAL HIGH (ref 8–23)
CO2: 36 mmol/L — ABNORMAL HIGH (ref 22–32)
Calcium: 9.5 mg/dL (ref 8.9–10.3)
Chloride: 95 mmol/L — ABNORMAL LOW (ref 98–111)
Creatinine, Ser: 1.19 mg/dL — ABNORMAL HIGH (ref 0.44–1.00)
GFR, Estimated: 40 mL/min — ABNORMAL LOW (ref >60)
Glucose, Bld: 91 mg/dL (ref 70–99)
Potassium: 4.1 mmol/L (ref 3.5–5.1)
Sodium: 139 mmol/L (ref 135–145)

## 2019-12-23 LAB — PROCALCITONIN: Procalcitonin: 0.23 ng/mL

## 2019-12-23 NOTE — Progress Notes (Signed)
PROGRESS NOTE    Julie Mcpherson  WGN:[id number] DOB: 02-26-1913 DOA: 12/21/2019 PCP: Lajean Manes, MD   Brief Narrative:   Julie Mcpherson is a 86 y.o. female with medical history significant of chronic diastolic CHF, LVEF 55 to 78% to third-degree diastolic dysfunction 46/9629, sick sinus syndrome status post PPM, chronic A. fib not on anticoagulation, HTN, CKD stage III, hearing impairment, presented with increasing shortness of breath.  Patient has significant heart rate 1, most history provided by patient's son over at bedside.  Son reported patient has had " aspiration pneumonia 2 days ago" was placed on p.o. antibiotics, main symptoms appears to be increasing shortness of breath.  Denied any fever chills.  Patient had pneumonia before, but not recent years. Patient was hospitalized 1 month ago for fluid overload, and was treated with IV Lasix which resolved.  Patient was discharged back, and not on routine diuresis because the patient age and other comorbidities. In ED patient stabilized on 3 L, but very quickly desats with minimal exertion.  Chest x-ray showed bilateral lower field infiltrate suspect for pneumonia versus CHF decompensation.  Vancomycin and cefepime were given in the ED.  WBC 8.0, creatinine 1.1, potassium 5.2.  Assessment & Plan:   Principal Problem:   Acute respiratory failure with hypoxia (HCC) Active Problems:   Essential hypertension   A-fib (HCC)   Pressure injury of skin   Goals of care, counseling/discussion   Chronic kidney disease, stage 3b (HCC)   CHF (congestive heart failure) (HCC)   Acute hypoxic respiratory failure in the setting of aspiration pneumonitis vs pneumonia, POA - Patient complains about essentially chronic choking with both solids and liquids -Speech recommending - Dysphpagia 2 fine chopped/honey thick -Chest x-ray and CT concerning for underlying fibrosis, likely chronic, history of working in textiles -Acute aspect continues to  be likely aspiration -Patient appears euvolemic, heart failure exacerbation less likely given markedly poor p.o. intake over the past few days due to swallowing difficulty  Hyperkalemia -Likely somewhat hemoconcentration given poor p.o. intake, continue Lasix Lab Results  Component Value Date   K 4.1 12/23/2019   Questionably acute on chronic diastolic CHF decompensation -As above, probably can be discharged back to nursing home on as needed Lasix instead of standing dose.  If concerned about overdiuresis/dehydration.   DVT prophylaxis: Heparin subcu code Status: DNR Family Communication: Son over the phone previously updated  Status is: Inpt  Dispo: The patient is from: [facility] independent living              Anticipated d/c is to: Same              Anticipated d/c date is: 24-48 hours              Patient currently not medically stable for discharge given ongoing need for IV antibiotics, evaluation with speech to ensure ability to take p.o. safely prior to discharge  Consultants:   Speech  Procedures:   None  Antimicrobials:  Unasyn  Subjective: No acute issues or events overnight denies chest pain, headache, fever, chills.  She does report ongoing choking sensation when attempting to take p.o. as well as shortness of breath worse with exertion  Objective: Vitals:   12/23/19 0046 12/23/19 0340 12/23/19 0748 12/23/19 0749  BP:  (!) 124/58    Pulse:  83 77   Resp:  (!) 24 17   Temp:  (!) 97.4 F (36.3 C)    TempSrc:  SpO2:  97% 97% 97%  Weight: 54.9 kg     Height:        Intake/Output Summary (Last 24 hours) at 12/23/2019 0803 Last data filed at 12/23/2019 0352 Gross per 24 hour  Intake 120 ml  Output 2000 ml  Net -1880 ml   Filed Weights   12/21/19 1825 12/22/19 0455 12/23/19 0046  Weight: 56.9 kg 56 kg 54.9 kg    Examination:  General:  Pleasantly resting in bed, No acute distress. HEENT:  Normocephalic atraumatic.  Sclerae nonicteric,  noninjected.  Extraocular movements intact bilaterally. Neck:  Without mass or deformity.  Trachea is midline. Lungs:  Clear to auscultate bilaterally without rhonchi, wheeze, or rales. Heart:  Regular rate and rhythm.  Without murmurs, rubs, or gallops. Abdomen:  Soft, nontender, nondistended.  Without guarding or rebound. Extremities: Without cyanosis, clubbing, edema, or obvious deformity. Vascular:  Dorsalis pedis and posterior tibial pulses palpable bilaterally. Skin:  Warm and dry, no erythema, no ulcerations.  Data Reviewed: I have personally reviewed following labs and imaging studies  CBC: Recent Labs  Lab 12/21/19 1050  WBC 8.0  NEUTROABS 6.3  HGB 12.1  HCT 38.6  MCV 98.0  PLT 924   Basic Metabolic Panel: Recent Labs  Lab 12/21/19 1309 12/22/19 0345 12/23/19 0124  NA 136 138 139  K 5.2* 5.4* 4.1  CL 99 96* 95*  CO2 29 32 36*  GLUCOSE 99 100* 91  BUN 29* 29* 32*  CREATININE 1.11* 1.17* 1.19*  CALCIUM 10.1 9.5 9.5   GFR: Estimated Creatinine Clearance: 19.6 mL/min (A) (by C-G formula based on SCr of 1.19 mg/dL (H)). Liver Function Tests: No results for input(s): AST, ALT, ALKPHOS, BILITOT, PROT, ALBUMIN in the last 168 hours. No results for input(s): LIPASE, AMYLASE in the last 168 hours. No results for input(s): AMMONIA in the last 168 hours. Coagulation Profile: No results for input(s): INR, PROTIME in the last 168 hours. Cardiac Enzymes: No results for input(s): CKTOTAL, CKMB, CKMBINDEX, TROPONINI in the last 168 hours. BNP (last 3 results) No results for input(s): PROBNP in the last 8760 hours. HbA1C: No results for input(s): HGBA1C in the last 72 hours. CBG: No results for input(s): GLUCAP in the last 168 hours. Lipid Profile: No results for input(s): CHOL, HDL, LDLCALC, TRIG, CHOLHDL, LDLDIRECT in the last 72 hours. Thyroid Function Tests: No results for input(s): TSH, T4TOTAL, FREET4, T3FREE, THYROIDAB in the last 72 hours. Anemia Panel: No  results for input(s): VITAMINB12, FOLATE, FERRITIN, TIBC, IRON, RETICCTPCT in the last 72 hours. Sepsis Labs: Recent Labs  Lab 12/21/19 1520 12/22/19 0345 12/23/19 0124  PROCALCITON 0.11 0.14 0.23    Recent Results (from the past 240 hour(s))  Respiratory Panel by RT PCR (Flu A&B, Covid) - Nasopharyngeal Swab     Status: None   Collection Time: 12/21/19 10:50 AM   Specimen: Nasopharyngeal Swab  Result Value Ref Range Status   SARS Coronavirus 2 by RT PCR NEGATIVE NEGATIVE Final    Comment: (NOTE) SARS-CoV-2 target nucleic acids are NOT DETECTED.  The SARS-CoV-2 RNA is generally detectable in upper respiratoy specimens during the acute phase of infection. The lowest concentration of SARS-CoV-2 viral copies this assay can detect is 131 copies/mL. A negative result does not preclude SARS-Cov-2 infection and should not be used as the sole basis for treatment or other patient management decisions. A negative result may occur with  improper specimen collection/handling, submission of specimen other than nasopharyngeal swab, presence of viral mutation(s) within the  areas targeted by this assay, and inadequate number of viral copies (<131 copies/mL). A negative result must be combined with clinical observations, patient history, and epidemiological information. The expected result is Negative.  Fact Sheet for Patients:  PinkCheek.be  Fact Sheet for Healthcare Providers:  GravelBags.it  This test is no t yet approved or cleared by the [place] FDA and  has been authorized for detection and/or diagnosis of SARS-CoV-2 by FDA under an Emergency Use Authorization (EUA). This EUA will remain  in effect (meaning this test can be used) for the duration of the COVID-19 declaration under Section 564(b)(1) of the Act, 21 U.S.C. section 360bbb-3(b)(1), unless the authorization is terminated or revoked sooner.     Influenza A by  PCR NEGATIVE NEGATIVE Final   Influenza B by PCR NEGATIVE NEGATIVE Final    Comment: (NOTE) The Xpert Xpress SARS-CoV-2/FLU/RSV assay is intended as an aid in  the diagnosis of influenza from Nasopharyngeal swab specimens and  should not be used as a sole basis for treatment. Nasal washings and  aspirates are unacceptable for Xpert Xpress SARS-CoV-2/FLU/RSV  testing.  Fact Sheet for Patients: PinkCheek.be  Fact Sheet for Healthcare Providers: GravelBags.it  This test is not yet approved or cleared by the [place] FDA and  has been authorized for detection and/or diagnosis of SARS-CoV-2 by  FDA under an Emergency Use Authorization (EUA). This EUA will remain  in effect (meaning this test can be used) for the duration of the  Covid-19 declaration under Section 564(b)(1) of the Act, 21  U.S.C. section 360bbb-3(b)(1), unless the authorization is  terminated or revoked. Performed at [facility], [place] [address] [place], [place] [address]   MRSA PCR Screening     Status: None   Collection Time: 12/22/19  5:16 AM   Specimen: Nasopharyngeal  Result Value Ref Range Status   MRSA by PCR NEGATIVE NEGATIVE Final    Comment:        The GeneXpert MRSA Assay (FDA approved for NASAL specimens only), is one component of a comprehensive MRSA colonization surveillance program. It is not intended to diagnose MRSA infection nor to guide or monitor treatment for MRSA infections. Performed at [facility], [place] [address] [place], [place] [address]          Radiology Studies: CT CHEST WO CONTRAST  Result Date: 12/21/2019 CLINICAL DATA:  Cardiomegaly, follow-up EXAM: CT CHEST WITHOUT CONTRAST TECHNIQUE: Multidetector CT imaging of the chest was performed following the standard protocol without IV contrast. COMPARISON:  Chest x-ray today FINDINGS: Cardiovascular: Cardiomegaly. Pacer wires noted in the right heart.  Densely calcified mitral valve annulus and aorta. Scattered coronary artery calcifications. Mediastinum/Nodes: Borderline sized mediastinal lymph nodes. Right paratracheal node has a short axis diameter of 10 mm. No axillary adenopathy. Lungs/Pleura: Areas of peripheral interstitial thickening most pronounced in the mid and lower lung zones, likely fibrosis. Small bilateral pleural effusions. Diffuse interstitial thickening and ground-glass opacities within the lungs may reflect edema. Upper Abdomen: Aortic atherosclerosis. Bilateral renal cysts. No hydronephrosis. Scattered colonic diverticula in the visualized transverse colon and descending colon. No acute findings. Musculoskeletal: Chest wall soft tissues are unremarkable. No acute bony abnormality. IMPRESSION: Cardiomegaly. Fused interstitial thickening and ground-glass opacities may reflect mild interstitial edema. Small bilateral pleural effusions. Findings likely superimposed on chronic lung disease/fibrosis. Coronary artery disease. Aortic Atherosclerosis (ICD10-I70.0). Electronically Signed   By: Rolm Baptise M.D.   On: 12/21/2019 17:15   DG Chest Port 1 View  Result  Date: 12/21/2019 CLINICAL DATA:  Aspiration, shortness of breath. EXAM: PORTABLE CHEST 1 VIEW COMPARISON:  Prior chest radiographs 11/14/2019 and earlier. FINDINGS: Redemonstrated left chest dual lead implantable cardiac device. Unchanged cardiomegaly. Aortic atherosclerosis. Bilateral airspace opacities, greatest within the right mid lung and right lung base. No sizable pleural effusion or evidence of pneumothorax. No acute bony abnormality is identified. Advanced degenerative changes of the left glenohumeral joint. IMPRESSION: Bilateral airspace opacities greatest within the right mid lung and right lung base. Findings may reflect pneumonia and/or edema. Unchanged cardiomegaly. Aortic Atherosclerosis (ICD10-I70.0). Electronically Signed   By: Kellie Simmering DO   On: 12/21/2019 10:31    DG Swallowing Func-Speech Pathology  Result Date: 12/22/2019 Objective Swallowing Evaluation: Type of Study: MBS-Modified Barium Swallow Study  Patient Details Name: KENNAH HEHR MRN: [id number] Date of Birth: 11-Apr-1913 Today's Date: 12/22/2019 Time: SLP Start Time (ACUTE ONLY): 1305 -SLP Stop Time (ACUTE ONLY): 1325 SLP Time Calculation (min) (ACUTE ONLY): 20 min Past Medical History: Past Medical History: Diagnosis Date . Arthritis  . Atrial fibrillation ([place])  . Blood transfusion  . Chronic kidney disease   stage 3 renal disease . Diverticular disease  . DJD (degenerative joint disease)  . DJD (degenerative joint disease)  . DVT (deep venous thrombosis) ([place]) early 1980's  left medial upper thigh . Fracture, shoulder ~ 2003; 1997  left; right . GERD (gastroesophageal reflux disease)  . High cholesterol  . High cholesterol  . Hypertension  . Overactive bladder  . Pacemaker  . Skin cancer of face  . Skin cancer of face  . Syncope secondary to post termination pauses 05/03/11  "passed out; first time ever" . Urinary frequency  Past Surgical History: Past Surgical History: Procedure Laterality Date . APPENDECTOMY  ~ 1938 . BLADDER SUSPENSION  1985 . CARPAL TUNNEL RELEASE  04/18/2011  right; Procedure: CARPAL TUNNEL RELEASE;  Surgeon: Cammie Sickle., MD;  Location: [facility];  Service: Orthopedics;  Laterality: Right; . CARPAL TUNNEL RELEASE  12/05/2011  Procedure: CARPAL TUNNEL RELEASE;  Surgeon: Cammie Sickle., MD;  Location: [facility];  Service: Orthopedics;  Laterality: Left; . CATARACT EXTRACTION W/ INTRAOCULAR LENS  IMPLANT, BILATERAL   . HERNIA REPAIR  1986  "stomach" . JOINT REPLACEMENT    hips . PERMANENT PACEMAKER INSERTION N/A 05/06/2011  Procedure: PERMANENT PACEMAKER INSERTION;  Surgeon: Deboraha Sprang, MD;  Location: [facility] CATH LAB;  Service: Cardiovascular;  Laterality: N/A; . SKIN CANCER EXCISION  2012  "twice" . TEMPORARY PACEMAKER INSERTION N/A 05/05/2011   Procedure: TEMPORARY PACEMAKER INSERTION;  Surgeon: Clent Demark, MD;  Location: [facility] CATH LAB;  Service: Cardiovascular;  Laterality: N/A; . TOTAL HIP ARTHROPLASTY    "2 on right; 1 on left" HPI: Pt is a 86 y.o. female with medical history significant of chronic diastolic CHF, LVEF 55 to 73% to third-degree diastolic dysfunction 71/0626, sick sinus syndrome status post PPM, chronic A. fib not on anticoagulation, HTN, CKD stage III, hearing impairment, who presented with increasing shortness of breath. Son reported patient has had " aspiration pneumonia 2 days ago". Son reported patient has had " aspiration pneumonia 2 days ago". CT chest: Cardiomegaly. Fused interstitial thickening and ground-glass opacities may reflect mild interstitial edema.  No data recorded Assessment / Plan / Recommendation CHL IP CLINICAL IMPRESSIONS 12/22/2019 Clinical Impression Pt presented with pharyngeal dysphagia characterized by reduced lingual retraction, a pharyngeal delay, and reduced anterior laryngeal movement. She demonstrated vallecular residue, pyriform sinus residue, penetration (PAS  3) and silent aspiration (PAS 8) of thin liquids, and nectar thick liquids via cup and straw. Pt's cough was weak and prompted coughing was ineffective in expelling penetrated/aspirated material. Amount of residue increased with advancement of solid consistencies. Postural modifications were attempted, but pt was unable to consistently demonstrate these. A dysphagia 2 diet with honey thick liquids is recommended at this time. SLP will follow for dysphagia treatment.  SLP Visit Diagnosis Dysphagia, pharyngeal phase (R13.13) Attention and concentration deficit following -- Frontal lobe and executive function deficit following -- Impact on safety and function Moderate aspiration risk;Mild aspiration risk   CHL IP TREATMENT RECOMMENDATION 12/22/2019 Treatment Recommendations Therapy as outlined in treatment plan below   Prognosis 12/22/2019  Prognosis for Safe Diet Advancement Fair Barriers to Reach Goals Severity of deficits Barriers/Prognosis Comment -- CHL IP DIET RECOMMENDATION 12/22/2019 SLP Diet Recommendations Dysphagia 2 (Fine chop) solids;Honey thick liquids Liquid Administration via Cup;Straw Medication Administration Crushed with puree Compensations Slow rate;Small sips/bites Postural Changes Remain semi-upright after after feeds/meals (Comment);Seated upright at 90 degrees   CHL IP OTHER RECOMMENDATIONS 12/22/2019 Recommended Consults -- Oral Care Recommendations Oral care BID Other Recommendations --   CHL IP FOLLOW UP RECOMMENDATIONS 12/22/2019 Follow up Recommendations Skilled Nursing facility   [facility] IP FREQUENCY AND DURATION 12/22/2019 Speech Therapy Frequency (ACUTE ONLY) min 2x/week Treatment Duration 2 weeks      CHL IP ORAL PHASE 12/22/2019 Oral Phase WFL Oral - Pudding Teaspoon -- Oral - Pudding Cup -- Oral - Honey Teaspoon -- Oral - Honey Cup -- Oral - Nectar Teaspoon -- Oral - Nectar Cup -- Oral - Nectar Straw -- Oral - Thin Teaspoon -- Oral - Thin Cup -- Oral - Thin Straw -- Oral - Puree -- Oral - Mech Soft -- Oral - Regular -- Oral - Multi-Consistency -- Oral - Pill -- Oral Phase - Comment --  CHL IP PHARYNGEAL PHASE 12/22/2019 Pharyngeal Phase Impaired Pharyngeal- Pudding Teaspoon -- Pharyngeal -- Pharyngeal- Pudding Cup -- Pharyngeal -- Pharyngeal- Honey Teaspoon -- Pharyngeal -- Pharyngeal- Honey Cup Pharyngeal residue - valleculae;Pharyngeal residue - pyriform;Reduced tongue base retraction;Reduced anterior laryngeal mobility Pharyngeal -- Pharyngeal- Nectar Teaspoon -- Pharyngeal -- Pharyngeal- Nectar Cup Pharyngeal residue - valleculae;Pharyngeal residue - pyriform;Reduced tongue base retraction;Reduced anterior laryngeal mobility;Penetration/Aspiration before swallow;Penetration/Aspiration during swallow Pharyngeal Material enters airway, remains ABOVE vocal cords and not ejected out;Material enters airway, passes BELOW  cords without attempt by patient to eject out (silent aspiration) Pharyngeal- Nectar Straw Pharyngeal residue - valleculae;Pharyngeal residue - pyriform;Reduced tongue base retraction;Reduced anterior laryngeal mobility;Penetration/Aspiration during swallow Pharyngeal Material enters airway, passes BELOW cords without attempt by patient to eject out (silent aspiration) Pharyngeal- Thin Teaspoon -- Pharyngeal -- Pharyngeal- Thin Cup Pharyngeal residue - valleculae;Pharyngeal residue - pyriform;Reduced tongue base retraction;Reduced anterior laryngeal mobility;Penetration/Aspiration during swallow Pharyngeal Material enters airway, passes BELOW cords without attempt by patient to eject out (silent aspiration);Material enters airway, remains ABOVE vocal cords and not ejected out Pharyngeal- Thin Straw Pharyngeal residue - valleculae;Pharyngeal residue - pyriform;Reduced tongue base retraction;Reduced anterior laryngeal mobility;Penetration/Aspiration during swallow Pharyngeal Material enters airway, remains ABOVE vocal cords and not ejected out;Material enters airway, passes BELOW cords without attempt by patient to eject out (silent aspiration) Pharyngeal- Puree Pharyngeal residue - valleculae;Pharyngeal residue - pyriform;Reduced tongue base retraction;Reduced anterior laryngeal mobility Pharyngeal -- Pharyngeal- Mechanical Soft Pharyngeal residue - valleculae;Pharyngeal residue - pyriform;Reduced tongue base retraction;Reduced anterior laryngeal mobility Pharyngeal -- Pharyngeal- Regular Pharyngeal residue - valleculae;Pharyngeal residue - pyriform;Reduced tongue base retraction;Reduced anterior laryngeal mobility Pharyngeal -- Pharyngeal- Multi-consistency --  Pharyngeal -- Pharyngeal- Pill Pharyngeal residue - valleculae;Pharyngeal residue - pyriform;Reduced tongue base retraction;Reduced anterior laryngeal mobility Pharyngeal -- Pharyngeal Comment --  ,Shanika I. Hardin Negus, [place], [place]  Office number [phone] Pager [phone] [place] Marshall 12/22/2019, 4:46 PM                   Scheduled Meds: . acetaminophen  500 mg Oral BID  . amLODipine  2.5 mg Oral Daily  . budesonide (PULMICORT) nebulizer solution  0.25 mg Nebulization BID  . calcium carbonate  625 mg Oral Q breakfast  . cholecalciferol  1,000 Units Oral Daily  . collagenase   Topical Daily  . furosemide  20 mg Intravenous BID  . gabapentin  100 mg Oral TID  . gatifloxacin  1 drop Left Eye QID  . heparin  5,000 Units Subcutaneous Q12H  . ipratropium  0.5 mg Nebulization BID  . metoprolol succinate  25 mg Oral Daily  . mirabegron ER  50 mg Oral QHS  . multivitamin with minerals  1 tablet Oral Daily  . prednisoLONE  20 mg Oral Daily  . sodium chloride flush  3 mL Intravenous Q12H   Continuous Infusions: . sodium chloride    . ampicillin-sulbactam (UNASYN) IV 3 g (12/23/19 0423)     LOS: 2 days   Time spent: 62min  Yorley Buch C Zakarie Sturdivant, DO [facility]  If 7PM-7AM, please contact night-coverage [email or web address]  12/23/2019, 8:03 AM

## 2019-12-23 NOTE — Progress Notes (Signed)
  Speech Language Pathology Treatment: Dysphagia  Patient Details Name: Julie Mcpherson MRN: [id number] DOB: 03/26/1913 Today's Date: 12/23/2019 Time: 7253-6644 SLP Time Calculation (min) (ACUTE ONLY): 10 min  Assessment / Plan / Recommendation Clinical Impression  Pt was seen for dysphagia treatment. Pt's nurse reported that the pt has been tolerating the current diet without overt s/sx of aspiration. No s/sx of aspiration were noted with puree solids or honey thick liquids via cup. One to two secondary swallows were observed with solids, suggesting residue. Pt refused additional boluses stating that she has had enough. SLP will continue to follow pt.    HPI HPI: Pt is a 86 y.o. female with medical history significant of chronic diastolic CHF, LVEF 55 to 03% to third-degree diastolic dysfunction 47/4259, sick sinus syndrome status post PPM, chronic A. fib not on anticoagulation, HTN, CKD stage III, hearing impairment, who presented with increasing shortness of breath. Son reported patient has had " aspiration pneumonia 2 days ago". Son reported patient has had " aspiration pneumonia 2 days ago". CT chest: Cardiomegaly. Fused interstitial thickening and ground-glass opacities may reflect mild interstitial edema.       SLP Plan  Continue with current plan of care       Recommendations  Diet recommendations: Dysphagia 2 (fine chop);Honey-thick liquid Liquids provided via: Cup;Straw Medication Administration: Crushed with puree Supervision: Staff to assist with self feeding Compensations: Minimize environmental distractions;Slow rate;Small sips/bites Postural Changes and/or Swallow Maneuvers: Seated upright 90 degrees;Upright 30-60 min after meal                Oral Care Recommendations: Oral care BID Follow up Recommendations: Skilled Nursing facility SLP Visit Diagnosis: Dysphagia, pharyngeal phase (R13.13) Plan: Continue with current plan of care       Julie Mcpherson I.  [name], [place], [place] Office number [phone] Pager [place] 12/23/2019, 9:22 AM

## 2019-12-23 NOTE — Progress Notes (Signed)
Initial Nutrition Assessment  DOCUMENTATION CODES:   Non-severe (moderate) malnutrition in context of chronic illness  INTERVENTION:   -Magic cup TID with meals, each supplement provides 290 kcal and 9 grams of protein -MVI with minerals daily  NUTRITION DIAGNOSIS:   Moderate Malnutrition related to chronic illness (CHF) as evidenced by mild fat depletion, moderate fat depletion, moderate muscle depletion, severe muscle depletion.  GOAL:   Patient will meet greater than or equal to 90% of their needs  MONITOR:   PO intake, Supplement acceptance, Diet advancement, Labs, Weight trends, Skin, I & O's  REASON FOR ASSESSMENT:   Low [place]    ASSESSMENT:   Julie Mcpherson is a 86 y.o. female with medical history significant of chronic diastolic CHF, LVEF 55 to 25% to third-degree diastolic dysfunction 85/2778, sick sinus syndrome status post PPM, chronic A. fib not on anticoagulation, HTN, CKD stage III, hearing impairment, presented with increasing shortness of breath.  Pt admitted with acute hypoxic respiratory failure in the setting of aspiration pneumonia.   11/18- s/p MBSS- advanced to dysphagia 2 diet with honey thick liquids  Reviewed I/O's: -1.9 L x 24 hours and -2.3 L since admission  UOP: 2 L x 24 hours  Spoke with pt at bedside, who is very HOH. She reports poor oral intake PTA secondary to dysphagia. Noted meal completion 25-50%. Pt shares she ate "some" breakfast this morning and has no difficulties tolerating current diet. She was residing at [facility] PTA.   Pt endorses gradual weight loss since her husband passed away in 10-May-2003. She is unsure of her UBW. Reviewed wt hx; noted wt has been stable over the past month, however, with gradual weight loss over the past several years.   Discussed importance of good meal and supplement intake to promote healing.   Medications reviewed and calcium with vitamin D, vitamin D3, lasix, and unasyn.   Labs reviewed.    NUTRITION - FOCUSED PHYSICAL EXAM:    Most Recent Value  Orbital Region Severe depletion  Upper Arm Region Moderate depletion  Thoracic and Lumbar Region Mild depletion  Buccal Region Moderate depletion  Temple Region Severe depletion  Clavicle Bone Region Severe depletion  Clavicle and Acromion Bone Region Severe depletion  Scapular Bone Region Severe depletion  Dorsal Hand Severe depletion  Patellar Region Moderate depletion  Anterior Thigh Region Moderate depletion  Posterior Calf Region Moderate depletion  Edema (RD Assessment) Mild  Hair Reviewed  Eyes Reviewed  Mouth Reviewed  Skin Reviewed  Nails Reviewed       Diet Order:   Diet Order            DIET DYS 2 Room service appropriate? Yes with Assist; Fluid consistency: Honey Thick  Diet effective now                 EDUCATION NEEDS:   Education needs have been addressed  Skin:  Skin Assessment: Skin Integrity Issues: Skin Integrity Issues:: Stage III, Stage I Stage I: buttocks Stage III: bilaterla heels  Last BM:  Unknown  Height:   Ht Readings from Last 1 Encounters:  12/21/19 5\' 6"  (1.676 m)    Weight:   Wt Readings from Last 1 Encounters:  12/23/19 54.9 kg    Ideal Body Weight:  59.1 kg  BMI:  Body mass index is 19.54 kg/m.  Estimated Nutritional Needs:   Kcal:  1700-1900  Protein:  80-95 grams  Fluid:  > 1.7 L    Loistine Chance, RD,  LDN, [place] Registered Dietitian II Certified Diabetes Care and Education Specialist Please refer to [facility] for RD and/or RD on-call/weekend/after hours pager

## 2019-12-23 NOTE — Plan of Care (Signed)
  Problem: Education: Goal: Knowledge of General Education information will improve Description Including pain rating scale, medication(s)/side effects and non-pharmacologic comfort measures Outcome: Progressing   Problem: Health Behavior/Discharge Planning: Goal: Ability to manage health-related needs will improve Outcome: Progressing   

## 2019-12-24 DIAGNOSIS — J9601 Acute respiratory failure with hypoxia: Secondary | ICD-10-CM | POA: Diagnosis not present

## 2019-12-24 LAB — BASIC METABOLIC PANEL
Anion gap: 10 (ref 5–15)
BUN: 33 mg/dL — ABNORMAL HIGH (ref 8–23)
CO2: 36 mmol/L — ABNORMAL HIGH (ref 22–32)
Calcium: 9.3 mg/dL (ref 8.9–10.3)
Chloride: 93 mmol/L — ABNORMAL LOW (ref 98–111)
Creatinine, Ser: 1.36 mg/dL — ABNORMAL HIGH (ref 0.44–1.00)
GFR, Estimated: 34 mL/min — ABNORMAL LOW (ref >60)
Glucose, Bld: 88 mg/dL (ref 70–99)
Potassium: 4.7 mmol/L (ref 3.5–5.1)
Sodium: 139 mmol/L (ref 135–145)

## 2019-12-24 NOTE — Progress Notes (Signed)
Pharmacy Antibiotic Note  Julie Mcpherson is a 86 y.o. female admitted on 12/21/2019 with pneumonia.  Pharmacy has been consulted for Unasyn dosing.  O2 requirements improving, 4L [place] on admission >> 1.5L. WBC not checked since 11/17. Kidney function has worsened, SCr 1.1 to 1.36. May need dose reduction but dose is appropriate for now.    Height: 5\' 6"  (167.6 cm) Weight: 53.7 kg (118 lb 6.2 oz) IBW/kg (Calculated) : 59.3  Temp (24hrs), Avg:97.7 F (36.5 C), Min:97.4 F (36.3 C), Max:97.9 F (36.6 C)  Recent Labs  Lab 12/21/19 1050 12/21/19 1309 12/22/19 0345 12/23/19 0124 12/24/19 0332  WBC 8.0  --   --   --   --   CREATININE  --  1.11* 1.17* 1.19* 1.36*    Estimated Creatinine Clearance: 16.8 mL/min (A) (by C-G formula based on SCr of 1.36 mg/dL (H)).    Allergies  Allergen Reactions  . Codeine Other (See Comments)    "can't sleep; I climb the walls"  . Statins Other (See Comments)    "ocular migraine headaches"  . Sulfa Antibiotics Anaphylaxis, Itching and Rash  . Other     Adsorbed-listed on MAR no reaction listed  . Tetanus Toxoids Other (See Comments)    "think it started turning red around where I got shot; they gave me small doses and I did fine"    Antimicrobials this admission: 11/17 Unasyn >>   Dose adjustments this admission: N/a  Microbiology results: Pending  Plan:  - Continue Unasyn 3g IV q12h  - Follow up w/ antibiotic de-escalation and LOT - Monitor renal function and clinical progress   Thank you for allowing pharmacy to be a part of this patient's care.  Mercy Riding, PharmD PGY1 Acute Care Pharmacy Resident Please refer to [facility] for unit-specific pharmacist

## 2019-12-24 NOTE — Progress Notes (Signed)
PROGRESS NOTE    [name]  WCH:[id number] DOB: 08-04-1913 DOA: 12/21/2019 PCP: Lajean Manes, MD   Brief Narrative:   Julie Mcpherson is a 86 y.o. female with medical history significant of chronic diastolic CHF, LVEF 55 to 35% to third-degree diastolic dysfunction 36/1443, sick sinus syndrome status post PPM, chronic A. fib not on anticoagulation, HTN, CKD stage III, hearing impairment, presented with increasing shortness of breath.  Patient has significant heart rate 1, most history provided by patient's son over at bedside.  Son reported patient has had " aspiration pneumonia 2 days ago" was placed on p.o. antibiotics, main symptoms appears to be increasing shortness of breath.  Denied any fever chills.  Patient had pneumonia before, but not recent years. Patient was hospitalized 1 month ago for fluid overload, and was treated with IV Lasix which resolved.  Patient was discharged back, and not on routine diuresis because the patient age and other comorbidities. In ED patient stabilized on 3 L, but very quickly desats with minimal exertion.  Chest x-ray showed bilateral lower field infiltrate suspect for pneumonia versus CHF decompensation. Vancomycin and cefepime were given in the ED.  WBC 8.0, creatinine 1.1, potassium 5.2.  Assessment & Plan:   Principal Problem:   Acute respiratory failure with hypoxia (HCC) Active Problems:   Essential hypertension   A-fib (HCC)   Pressure injury of skin   Goals of care, counseling/discussion   Chronic kidney disease, stage 3b (HCC)   CHF (congestive heart failure) (HCC)   Malnutrition of moderate degree   Acute hypoxic respiratory failure in the setting of aspiration pneumonitis vs pneumonia, POA - Patient complains about essentially chronic choking with both solids and liquids - Speech recommending  - Dysphpagia 2 fine chopped/honey thick - Chest x-ray and CT concerning for underlying fibrosis, likely chronic, history of working in  textiles - Acute aspect continues to be likely aspiration - Patient appears euvolemic, heart failure exacerbation less likely given markedly poor p.o. intake over the past few days due to swallowing difficulty  Hyperkalemia -Likely somewhat hemoconcentration given poor p.o. intake, continue Lasix Lab Results  Component Value Date   K 4.7 12/24/2019   Questionably acute on chronic diastolic CHF decompensation -As above, probably can be discharged back to nursing home on as needed Lasix instead of standing dose.  If concerned about overdiuresis/dehydration.   DVT prophylaxis: Heparin subcu code Status: DNR Family Communication: Son over the phone today - lengthy discussion about disposition  Status is: Inpt  Dispo: The patient is from: [facility] independent living **[facility]**              Anticipated d/c is to: Same              Anticipated d/c date is: 24-48 hours              Patient currently not medically stable for discharge given ongoing need for IV antibiotics, evaluation with speech to ensure ability to take p.o. safely prior to discharge  Consultants:   Speech  Procedures:   None  Antimicrobials:  Unasyn  Subjective: No acute issues or events overnight denies chest pain, headache, fever, chills. Reports marked improvement in breathing/dyspnea.  Objective: Vitals:   12/23/19 2041 12/24/19 0059 12/24/19 0454 12/24/19 0738  BP: 124/69  (!) 100/57   Pulse: 76  72   Resp: 18  17   Temp: 97.9 F (36.6 C)  (!) 97.4 F (36.3  C)   TempSrc: Oral  Oral   SpO2: 96%  95% 98%  Weight:  53.7 kg    Height:        Intake/Output Summary (Last 24 hours) at 12/24/2019 0748 Last data filed at 12/24/2019 0037 Gross per 24 hour  Intake 783 ml  Output 2200 ml  Net -1417 ml   Filed Weights   12/22/19 0455 12/23/19 0046 12/24/19 0059  Weight: 56 kg 54.9 kg 53.7 kg    Examination:  General:  Pleasantly resting in bed,  No acute distress. HEENT:  Normocephalic atraumatic.  Sclerae nonicteric, noninjected. Extraocular movements intact bilaterally. Neck:  Without mass or deformity.  Trachea is midline. Lungs:  Clear to auscultate bilaterally without rhonchi, wheeze, or rales. Heart:  Regular rate and rhythm.  Without murmurs, rubs, or gallops. Abdomen:  Soft, nontender, nondistended.  Without guarding or rebound. Extremities: Without cyanosis, clubbing, edema, or obvious deformity. Vascular:  Dorsalis pedis and posterior tibial pulses palpable bilaterally. Skin:  Warm and dry, no erythema, no ulcerations.  Data Reviewed: I have personally reviewed following labs and imaging studies  CBC: Recent Labs  Lab 12/21/19 1050  WBC 8.0  NEUTROABS 6.3  HGB 12.1  HCT 38.6  MCV 98.0  PLT 676   Basic Metabolic Panel: Recent Labs  Lab 12/21/19 1309 12/22/19 0345 12/23/19 0124 12/24/19 0332  NA 136 138 139 139  K 5.2* 5.4* 4.1 4.7  CL 99 96* 95* 93*  CO2 29 32 36* 36*  GLUCOSE 99 100* 91 88  BUN 29* 29* 32* 33*  CREATININE 1.11* 1.17* 1.19* 1.36*  CALCIUM 10.1 9.5 9.5 9.3   GFR: Estimated Creatinine Clearance: 16.8 mL/min (A) (by C-G formula based on SCr of 1.36 mg/dL (H)). Liver Function Tests: No results for input(s): AST, ALT, ALKPHOS, BILITOT, PROT, ALBUMIN in the last 168 hours. No results for input(s): LIPASE, AMYLASE in the last 168 hours. No results for input(s): AMMONIA in the last 168 hours. Coagulation Profile: No results for input(s): INR, PROTIME in the last 168 hours. Cardiac Enzymes: No results for input(s): CKTOTAL, CKMB, CKMBINDEX, TROPONINI in the last 168 hours. BNP (last 3 results) No results for input(s): PROBNP in the last 8760 hours. HbA1C: No results for input(s): HGBA1C in the last 72 hours. CBG: No results for input(s): GLUCAP in the last 168 hours. Lipid Profile: No results for input(s): CHOL, HDL, LDLCALC, TRIG, CHOLHDL, LDLDIRECT in the last 72 hours. Thyroid  Function Tests: No results for input(s): TSH, T4TOTAL, FREET4, T3FREE, THYROIDAB in the last 72 hours. Anemia Panel: No results for input(s): VITAMINB12, FOLATE, FERRITIN, TIBC, IRON, RETICCTPCT in the last 72 hours. Sepsis Labs: Recent Labs  Lab 12/21/19 1520 12/22/19 0345 12/23/19 0124  PROCALCITON 0.11 0.14 0.23    Recent Results (from the past 240 hour(s))  Respiratory Panel by RT PCR (Flu A&B, Covid) - Nasopharyngeal Swab     Status: None   Collection Time: 12/21/19 10:50 AM   Specimen: Nasopharyngeal Swab  Result Value Ref Range Status   SARS Coronavirus 2 by RT PCR NEGATIVE NEGATIVE Final    Comment: (NOTE) SARS-CoV-2 target nucleic acids are NOT DETECTED.  The SARS-CoV-2 RNA is generally detectable in upper respiratoy specimens during the acute phase of infection. The lowest concentration of SARS-CoV-2 viral copies this assay can detect is 131 copies/mL. A negative result does not preclude SARS-Cov-2 infection and should not be used as the sole basis for treatment or other patient management decisions. A negative result may occur  with  improper specimen collection/handling, submission of specimen other than nasopharyngeal swab, presence of viral mutation(s) within the areas targeted by this assay, and inadequate number of viral copies (<131 copies/mL). A negative result must be combined with clinical observations, patient history, and epidemiological information. The expected result is Negative.  Fact Sheet for Patients:  PinkCheek.be  Fact Sheet for Healthcare Providers:  GravelBags.it  This test is no t yet approved or cleared by the [place] FDA and  has been authorized for detection and/or diagnosis of SARS-CoV-2 by FDA under an Emergency Use Authorization (EUA). This EUA will remain  in effect (meaning this test can be used) for the duration of the COVID-19 declaration under Section 564(b)(1) of  the Act, 21 U.S.C. section 360bbb-3(b)(1), unless the authorization is terminated or revoked sooner.     Influenza A by PCR NEGATIVE NEGATIVE Final   Influenza B by PCR NEGATIVE NEGATIVE Final    Comment: (NOTE) The Xpert Xpress SARS-CoV-2/FLU/RSV assay is intended as an aid in  the diagnosis of influenza from Nasopharyngeal swab specimens and  should not be used as a sole basis for treatment. Nasal washings and  aspirates are unacceptable for Xpert Xpress SARS-CoV-2/FLU/RSV  testing.  Fact Sheet for Patients: PinkCheek.be  Fact Sheet for Healthcare Providers: GravelBags.it  This test is not yet approved or cleared by the [place] FDA and  has been authorized for detection and/or diagnosis of SARS-CoV-2 by  FDA under an Emergency Use Authorization (EUA). This EUA will remain  in effect (meaning this test can be used) for the duration of the  Covid-19 declaration under Section 564(b)(1) of the Act, 21  U.S.C. section 360bbb-3(b)(1), unless the authorization is  terminated or revoked. Performed at [facility], [place] [address] [place], [place] [address]   MRSA PCR Screening     Status: None   Collection Time: 12/22/19  5:16 AM   Specimen: Nasopharyngeal  Result Value Ref Range Status   MRSA by PCR NEGATIVE NEGATIVE Final    Comment:        The GeneXpert MRSA Assay (FDA approved for NASAL specimens only), is one component of a comprehensive MRSA colonization surveillance program. It is not intended to diagnose MRSA infection nor to guide or monitor treatment for MRSA infections. Performed at [facility], [place] [address], [place], [place] [address]     Radiology Studies: DG Swallowing Func-Speech Pathology  Result Date: 12/22/2019 Objective Swallowing Evaluation: Type of Study: MBS-Modified Barium Swallow Study  Patient Details Name: NAVEAH BRAVE MRN: [id number] Date of Birth: 1913-12-22  Today's Date: 12/22/2019 Time: SLP Start Time (ACUTE ONLY): 1305 -SLP Stop Time (ACUTE ONLY): 1325 SLP Time Calculation (min) (ACUTE ONLY): 20 min Past Medical History: Past Medical History: Diagnosis Date . Arthritis  . Atrial fibrillation ([place])  . Blood transfusion  . Chronic kidney disease   stage 3 renal disease . Diverticular disease  . DJD (degenerative joint disease)  . DJD (degenerative joint disease)  . DVT (deep venous thrombosis) ([place]) early 1980's  left medial upper thigh . Fracture, shoulder ~ 2003; 1997  left; right . GERD (gastroesophageal reflux disease)  . High cholesterol  . High cholesterol  . Hypertension  . Overactive bladder  . Pacemaker  . Skin cancer of face  . Skin cancer of face  . Syncope secondary to post termination pauses 05/03/11  "passed out; first time ever" . Urinary frequency  Past Surgical History: Past Surgical History: Procedure Laterality Date .  APPENDECTOMY  ~ 1938 . BLADDER SUSPENSION  1985 . CARPAL TUNNEL RELEASE  04/18/2011  right; Procedure: CARPAL TUNNEL RELEASE;  Surgeon: Cammie Sickle., MD;  Location: [place];  Service: Orthopedics;  Laterality: Right; . CARPAL TUNNEL RELEASE  12/05/2011  Procedure: CARPAL TUNNEL RELEASE;  Surgeon: Cammie Sickle., MD;  Location: [place];  Service: Orthopedics;  Laterality: Left; . CATARACT EXTRACTION W/ INTRAOCULAR LENS  IMPLANT, BILATERAL   . HERNIA REPAIR  1986  "stomach" . JOINT REPLACEMENT    hips . PERMANENT PACEMAKER INSERTION N/A 05/06/2011  Procedure: PERMANENT PACEMAKER INSERTION;  Surgeon: Deboraha Sprang, MD;  Location: [facility] CATH LAB;  Service: Cardiovascular;  Laterality: N/A; . SKIN CANCER EXCISION  2012  "twice" . TEMPORARY PACEMAKER INSERTION N/A 05/05/2011  Procedure: TEMPORARY PACEMAKER INSERTION;  Surgeon: Clent Demark, MD;  Location: [facility] CATH LAB;  Service: Cardiovascular;  Laterality: N/A; . TOTAL HIP ARTHROPLASTY    "2 on right; 1 on left" HPI: Pt is a 86 y.o. female with  medical history significant of chronic diastolic CHF, LVEF 55 to 32% to third-degree diastolic dysfunction 67/1245, sick sinus syndrome status post PPM, chronic A. fib not on anticoagulation, HTN, CKD stage III, hearing impairment, who presented with increasing shortness of breath. Son reported patient has had " aspiration pneumonia 2 days ago". Son reported patient has had " aspiration pneumonia 2 days ago". CT chest: Cardiomegaly. Fused interstitial thickening and ground-glass opacities may reflect mild interstitial edema.  No data recorded Assessment / Plan / Recommendation CHL IP CLINICAL IMPRESSIONS 12/22/2019 Clinical Impression Pt presented with pharyngeal dysphagia characterized by reduced lingual retraction, a pharyngeal delay, and reduced anterior laryngeal movement. She demonstrated vallecular residue, pyriform sinus residue, penetration (PAS 3) and silent aspiration (PAS 8) of thin liquids, and nectar thick liquids via cup and straw. Pt's cough was weak and prompted coughing was ineffective in expelling penetrated/aspirated material. Amount of residue increased with advancement of solid consistencies. Postural modifications were attempted, but pt was unable to consistently demonstrate these. A dysphagia 2 diet with honey thick liquids is recommended at this time. SLP will follow for dysphagia treatment.  SLP Visit Diagnosis Dysphagia, pharyngeal phase (R13.13) Attention and concentration deficit following -- Frontal lobe and executive function deficit following -- Impact on safety and function Moderate aspiration risk;Mild aspiration risk   CHL IP TREATMENT RECOMMENDATION 12/22/2019 Treatment Recommendations Therapy as outlined in treatment plan below   Prognosis 12/22/2019 Prognosis for Safe Diet Advancement Fair Barriers to Reach Goals Severity of deficits Barriers/Prognosis Comment -- CHL IP DIET RECOMMENDATION 12/22/2019 SLP Diet Recommendations Dysphagia 2 (Fine chop) solids;Honey thick liquids  Liquid Administration via Cup;Straw Medication Administration Crushed with puree Compensations Slow rate;Small sips/bites Postural Changes Remain semi-upright after after feeds/meals (Comment);Seated upright at 90 degrees   CHL IP OTHER RECOMMENDATIONS 12/22/2019 Recommended Consults -- Oral Care Recommendations Oral care BID Other Recommendations --   CHL IP FOLLOW UP RECOMMENDATIONS 12/22/2019 Follow up Recommendations Skilled Nursing facility   [facility] IP FREQUENCY AND DURATION 12/22/2019 Speech Therapy Frequency (ACUTE ONLY) min 2x/week Treatment Duration 2 weeks      CHL IP ORAL PHASE 12/22/2019 Oral Phase WFL Oral - Pudding Teaspoon -- Oral - Pudding Cup -- Oral - Honey Teaspoon -- Oral - Honey Cup -- Oral - Nectar Teaspoon -- Oral - Nectar Cup -- Oral - Nectar Straw -- Oral - Thin Teaspoon -- Oral - Thin Cup -- Oral - Thin Straw -- Oral - Puree -- Oral -  Mech Soft -- Oral - Regular -- Oral - Multi-Consistency -- Oral - Pill -- Oral Phase - Comment --  CHL IP PHARYNGEAL PHASE 12/22/2019 Pharyngeal Phase Impaired Pharyngeal- Pudding Teaspoon -- Pharyngeal -- Pharyngeal- Pudding Cup -- Pharyngeal -- Pharyngeal- Honey Teaspoon -- Pharyngeal -- Pharyngeal- Honey Cup Pharyngeal residue - valleculae;Pharyngeal residue - pyriform;Reduced tongue base retraction;Reduced anterior laryngeal mobility Pharyngeal -- Pharyngeal- Nectar Teaspoon -- Pharyngeal -- Pharyngeal- Nectar Cup Pharyngeal residue - valleculae;Pharyngeal residue - pyriform;Reduced tongue base retraction;Reduced anterior laryngeal mobility;Penetration/Aspiration before swallow;Penetration/Aspiration during swallow Pharyngeal Material enters airway, remains ABOVE vocal cords and not ejected out;Material enters airway, passes BELOW cords without attempt by patient to eject out (silent aspiration) Pharyngeal- Nectar Straw Pharyngeal residue - valleculae;Pharyngeal residue - pyriform;Reduced tongue base retraction;Reduced anterior laryngeal  mobility;Penetration/Aspiration during swallow Pharyngeal Material enters airway, passes BELOW cords without attempt by patient to eject out (silent aspiration) Pharyngeal- Thin Teaspoon -- Pharyngeal -- Pharyngeal- Thin Cup Pharyngeal residue - valleculae;Pharyngeal residue - pyriform;Reduced tongue base retraction;Reduced anterior laryngeal mobility;Penetration/Aspiration during swallow Pharyngeal Material enters airway, passes BELOW cords without attempt by patient to eject out (silent aspiration);Material enters airway, remains ABOVE vocal cords and not ejected out Pharyngeal- Thin Straw Pharyngeal residue - valleculae;Pharyngeal residue - pyriform;Reduced tongue base retraction;Reduced anterior laryngeal mobility;Penetration/Aspiration during swallow Pharyngeal Material enters airway, remains ABOVE vocal cords and not ejected out;Material enters airway, passes BELOW cords without attempt by patient to eject out (silent aspiration) Pharyngeal- Puree Pharyngeal residue - valleculae;Pharyngeal residue - pyriform;Reduced tongue base retraction;Reduced anterior laryngeal mobility Pharyngeal -- Pharyngeal- Mechanical Soft Pharyngeal residue - valleculae;Pharyngeal residue - pyriform;Reduced tongue base retraction;Reduced anterior laryngeal mobility Pharyngeal -- Pharyngeal- Regular Pharyngeal residue - valleculae;Pharyngeal residue - pyriform;Reduced tongue base retraction;Reduced anterior laryngeal mobility Pharyngeal -- Pharyngeal- Multi-consistency -- Pharyngeal -- Pharyngeal- Pill Pharyngeal residue - valleculae;Pharyngeal residue - pyriform;Reduced tongue base retraction;Reduced anterior laryngeal mobility Pharyngeal -- Pharyngeal Comment --  ,Shanika I. Hardin Negus, [place], [place] Office number [phone] Pager [phone] Horton Marshall 12/22/2019, 4:46 PM               Scheduled Meds: . acetaminophen  500 mg Oral BID  . amLODipine  2.5 mg Oral Daily  . budesonide  (PULMICORT) nebulizer solution  0.25 mg Nebulization BID  . calcium carbonate  625 mg Oral Q breakfast  . cholecalciferol  1,000 Units Oral Daily  . collagenase   Topical Daily  . furosemide  20 mg Intravenous BID  . gabapentin  100 mg Oral TID  . heparin  5,000 Units Subcutaneous Q12H  . ipratropium  0.5 mg Nebulization BID  . metoprolol succinate  25 mg Oral Daily  . mirabegron ER  50 mg Oral QHS  . multivitamin with minerals  1 tablet Oral Daily  . prednisoLONE  20 mg Oral Daily  . sodium chloride flush  3 mL Intravenous Q12H   Continuous Infusions: . sodium chloride    . ampicillin-sulbactam (UNASYN) IV 3 g (12/24/19 0508)     LOS: 3 days   Time spent: 70min  Tabita Corbo C Chatham How[place], DO [facility]  If 7PM-7AM, please contact night-coverage [email or web address]  12/24/2019, 7:48 AM

## 2019-12-24 NOTE — Progress Notes (Signed)
SATURATION QUALIFICATIONS: (This note is used to comply with regulatory documentation for home oxygen)  Patient Saturations on Room Air at Rest = 83%  Patient Saturations on Room Air while Ambulating = can't perform    Please briefly explain why patient needs home oxygen: patient desat on RA at rest

## 2019-12-25 LAB — BASIC METABOLIC PANEL
Anion gap: 9 (ref 5–15)
BUN: 42 mg/dL — ABNORMAL HIGH (ref 8–23)
CO2: 41 mmol/L — ABNORMAL HIGH (ref 22–32)
Calcium: 10 mg/dL (ref 8.9–10.3)
Chloride: 91 mmol/L — ABNORMAL LOW (ref 98–111)
Creatinine, Ser: 1.64 mg/dL — ABNORMAL HIGH (ref 0.44–1.00)
GFR, Estimated: 27 mL/min — ABNORMAL LOW (ref >60)
Glucose, Bld: 100 mg/dL — ABNORMAL HIGH (ref 70–99)
Potassium: 5.2 mmol/L — ABNORMAL HIGH (ref 3.5–5.1)
Sodium: 141 mmol/L (ref 135–145)

## 2019-12-25 MED ORDER — PREDNISONE 10 MG PO TABS: ORAL_TABLET | ORAL | 0 refills | Status: AC

## 2019-12-25 MED ORDER — AMOXICILLIN-POT CLAVULANATE 875-125 MG PO TABS: 1.0000 | ORAL_TABLET | Freq: Two times a day (BID) | ORAL | 0 refills | Status: AC

## 2019-12-25 MED ORDER — MOXIFLOXACIN HCL 0.5 % OP SOLN: 1.0000 [drp] | Freq: Four times a day (QID) | OPHTHALMIC | 0 refills | Status: AC

## 2019-12-25 NOTE — TOC Progression Note (Addendum)
Transition of Care [facility]) - Progression Note    Patient Details  Name: Julie Mcpherson MRN: [id number] Date of Birth: 1913/11/28  Transition of Care [facility]) CM/SW [place], [place] Phone Number: 12/25/2019, 1:18 PM  Clinical Narrative:     CSW spoke with pt's son Julie Mcpherson by phone.  Pt will return to [facility].  CSW spoke with Claiborne Billings at [place].  Facility prefers pt to discharge to facility on Monday due to not having a nurse supervisor on weekend for medication needs.  CSW updated physician, nurse and son Julie Mcpherson.  TOC team will continue to assist for disposition planning.       Expected Discharge Plan and Services           Expected Discharge Date: 12/25/19                                     Social Determinants of Health (SDOH) Interventions    Readmission Risk Interventions No flowsheet data found.

## 2019-12-25 NOTE — Progress Notes (Signed)
Removed patients oxygen. Transferred from bed to chair and patients oxygen level decreased to 79%, Up to 85% in chair but unable to increase it any higher until placing her back on 1.5L O2 [place] which at that time increased to 95%. Educated patient on what we were trialing and she stated "I don't think that's going to work"

## 2019-12-25 NOTE — Discharge Summary (Signed)
Physician Discharge Summary  IO DIEUJUSTE TDV:[id number] DOB: February 22, 1913 DOA: 12/21/2019  PCP: Lajean Manes, MD  Admit date: 12/21/2019 Discharge date: 12/25/2019  Admitted From: SNF Disposition: Same  Recommendations for Outpatient Follow-up:  1. Follow up with PCP in 1-2 weeks  Equipment/Devices: 2 L nasal cannula oxygen around-the-clock  Discharge Condition: Guarded CODE STATUS: DNR Diet recommendation: Regular diet as tolerated  Brief/Interim Summary: Julie Yono Hilliardis a 86 y.o.femalewith medical history significant ofchronic diastolic CHF,LVEF 55 to 69% to third-degree diastolic dysfunction 48/5462,VOJJ sinus syndrome status post PPM, chronic A. fib not on anticoagulation, HTN, CKD stage III, hearing impairment, presented with increasing shortness of breath.Patient has significant heart rate 1, most history provided by patient's son over at bedside. Son reported patient has had "aspiration pneumonia 2 days ago"was placed on p.o. antibiotics,main symptoms appears to be increasing shortness of breath. Denied any fever chills.Patient had pneumonia before, but not recent years. Patient was hospitalized 1 month ago for fluid overload,and was treated with IV Lasix which resolved. Patient was discharged back,and not on routine diuresis because the patient age 86 and other comorbidities. In ED patient stabilized on 3 L, but very quickly desats with minimal exertion. Chest x-ray showed bilateral lower field infiltrate suspect for pneumonia versus CHF decompensation. Vancomycin and cefepime were given in the ED. WBC 8.0, creatinine 1.1, potassium 5.2.  Patient admitted as above with acute hypoxic respiratory failure in the setting of aspiration pneumonia versus pneumonitis.  Patient has what appears to be chronic dysphagia, evaluated by speech therapy noted to require dysphagia 2 diet with fine chopped foods and honey thick liquids.  Patient's p.o. intake drastically  improved thereafter, she had no further symptoms of choking or dysphagia.  Her aspiration pneumonia continues to improve, she has been weaned down on oxygen currently requiring 2 L nasal cannula at rest and exertion likely in the setting of chronic aspiration, will hopefully be able to wean off of oxygen in the next few weeks but given advanced age and history of chronic aspiration this may be less likely.  Defer to PCP for repeat evaluation and imaging as well as oxygen weaning.  Patient will be discharged on remainder of antibiotic course with Augmentin, new oxygen at 2 L nasal cannula around-the-clock as well as steroid taper.  Patient otherwise stable and agreeable for discharge home, son updated and is agreeable with our current course of action.  Discharge Diagnoses:  Principal Problem:   Acute respiratory failure with hypoxia (HCC) Active Problems:   Essential hypertension   A-fib (HCC)   Pressure injury of skin   Goals of care, counseling/discussion   Chronic kidney disease, stage 3b (HCC)   CHF (congestive heart failure) (HCC)   Malnutrition of moderate degree    Discharge Instructions  Discharge Instructions    Call MD for:  difficulty breathing, headache or visual disturbances   Complete by: As directed    Call MD for:  persistant nausea and vomiting   Complete by: As directed    Call MD for:  temperature >100.4   Complete by: As directed    Diet - low sodium heart healthy   Complete by: As directed    Discharge wound care:   Complete by: As directed    Foam dressing  Every 3 days:  Silicone foam dressings to the right heel change every 3 days. Assess under dressings each shift for any acute changes in the wounds.  Wound care: every shift      Comments: Apply Santyl to  the left heel daily liberally. Top with saline moist gauze and dry dressing. Change daily.   For home use only DME oxygen   Complete by: As directed    Length of Need: 6 Months   Mode or (Route): Nasal  cannula   Liters per Minute: 2   Frequency: Continuous (stationary and portable oxygen unit needed)   Oxygen conserving device: No   Oxygen delivery system: Gas   Increase activity slowly   Complete by: As directed      Allergies as of 12/25/2019      Reactions   Codeine Other (See Comments)   "can't sleep; I climb the walls"   Statins Other (See Comments)   "ocular migraine headaches"   Sulfa Antibiotics Anaphylaxis, Itching, Rash   Other    Adsorbed-listed on MAR no reaction listed   Tetanus Toxoids Other (See Comments)   "think it started turning red around where I got shot; they gave me small doses and I did fine"      Medication List    STOP taking these medications   cephALEXin 500 MG capsule Commonly known as: KEFLEX     TAKE these medications   acetaminophen 500 MG tablet Commonly known as: TYLENOL Take 500 mg by mouth 2 (two) times daily.   amLODipine 2.5 MG tablet Commonly known as: NORVASC Take 2.5 mg by mouth daily. What changed: Another medication with the same name was removed. Continue taking this medication, and follow the directions you see here.   amoxicillin-clavulanate 875-125 MG tablet Commonly known as: Augmentin Take 1 tablet by mouth 2 (two) times daily for 3 days.   calcium carbonate 600 MG Tabs tablet Commonly known as: OS-CAL Take 600 mg by mouth daily with breakfast.   cholecalciferol 1000 units tablet Commonly known as: VITAMIN D Take 1,000 Units by mouth daily.   gabapentin 100 MG capsule Commonly known as: Neurontin Take 1 capsule (100 mg total) by mouth 3 (three) times daily.   magnesium hydroxide 400 MG/5ML suspension Commonly known as: MILK OF MAGNESIA Take 30 mLs by mouth daily as needed for mild constipation (x 48 hours,if no relief,add miralax by mouth daily for 3 days.).   metoprolol succinate 25 MG 24 hr tablet Commonly known as: TOPROL-XL Take 1 tablet (25 mg total) by mouth daily.   moxifloxacin 0.5 % ophthalmic  solution Commonly known as: VIGAMOX Place 1 drop into the left eye 4 (four) times daily for 10 days. Course for 10 days began 12/13/19   multivitamin with minerals Tabs tablet Take 1 tablet by mouth daily.   Myrbetriq 50 MG Tb24 tablet Generic drug: mirabegron ER Take 1 tablet (50 mg total) by mouth daily. What changed: when to take this   OVER THE COUNTER MEDICATION Take 120 mLs by mouth in the morning and at bedtime. Med pass 2.0   polyethylene glycol 17 g packet Commonly known as: MIRALAX / GLYCOLAX Take 17 g by mouth daily as needed for mild constipation (for 3 days,if no relief by milk of mag).   predniSONE 10 MG tablet Commonly known as: DELTASONE Take 2 tablets (20 mg total) by mouth daily for 3 days, THEN 1 tablet (10 mg total) daily for 3 days. Start taking on: December 25, 2019            Durable Medical Equipment  (From admission, onward)         Start     Ordered   12/25/19 0000  For home use only  DME oxygen       Question Answer Comment  Length of Need 6 Months   Mode or (Route) Nasal cannula   Liters per Minute 2   Frequency Continuous (stationary and portable oxygen unit needed)   Oxygen conserving device No   Oxygen delivery system Gas      12/25/19 1115           Discharge Care Instructions  (From admission, onward)         Start     Ordered   12/25/19 0000  Discharge wound care:       Comments: Foam dressing  Every 3 days:  Silicone foam dressings to the right heel change every 3 days. Assess under dressings each shift for any acute changes in the wounds.  Wound care: every shift      Comments: Apply Santyl to the left heel daily liberally. Top with saline moist gauze and dry dressing. Change daily.   12/25/19 1114          Allergies  Allergen Reactions  . Codeine Other (See Comments)    "can't sleep; I climb the walls"  . Statins Other (See Comments)    "ocular migraine headaches"  . Sulfa Antibiotics Anaphylaxis, Itching and  Rash  . Other     Adsorbed-listed on MAR no reaction listed  . Tetanus Toxoids Other (See Comments)    "think it started turning red around where I got shot; they gave me small doses and I did fine"    Consultations:  None   Procedures/Studies: CT CHEST WO CONTRAST  Result Date: 12/21/2019 CLINICAL DATA:  Cardiomegaly, follow-up EXAM: CT CHEST WITHOUT CONTRAST TECHNIQUE: Multidetector CT imaging of the chest was performed following the standard protocol without IV contrast. COMPARISON:  Chest x-ray today FINDINGS: Cardiovascular: Cardiomegaly. Pacer wires noted in the right heart. Densely calcified mitral valve annulus and aorta. Scattered coronary artery calcifications. Mediastinum/Nodes: Borderline sized mediastinal lymph nodes. Right paratracheal node has a short axis diameter of 10 mm. No axillary adenopathy. Lungs/Pleura: Areas of peripheral interstitial thickening most pronounced in the mid and lower lung zones, likely fibrosis. Small bilateral pleural effusions. Diffuse interstitial thickening and ground-glass opacities within the lungs may reflect edema. Upper Abdomen: Aortic atherosclerosis. Bilateral renal cysts. No hydronephrosis. Scattered colonic diverticula in the visualized transverse colon and descending colon. No acute findings. Musculoskeletal: Chest wall soft tissues are unremarkable. No acute bony abnormality. IMPRESSION: Cardiomegaly. Fused interstitial thickening and ground-glass opacities may reflect mild interstitial edema. Small bilateral pleural effusions. Findings likely superimposed on chronic lung disease/fibrosis. Coronary artery disease. Aortic Atherosclerosis (ICD10-I70.0). Electronically Signed   By: Rolm Baptise M.D.   On: 12/21/2019 17:15   DG Chest Port 1 View  Result Date: 12/21/2019 CLINICAL DATA:  Aspiration, shortness of breath. EXAM: PORTABLE CHEST 1 VIEW COMPARISON:  Prior chest radiographs 11/14/2019 and earlier. FINDINGS: Redemonstrated left chest dual  lead implantable cardiac device. Unchanged cardiomegaly. Aortic atherosclerosis. Bilateral airspace opacities, greatest within the right mid lung and right lung base. No sizable pleural effusion or evidence of pneumothorax. No acute bony abnormality is identified. Advanced degenerative changes of the left glenohumeral joint. IMPRESSION: Bilateral airspace opacities greatest within the right mid lung and right lung base. Findings may reflect pneumonia and/or edema. Unchanged cardiomegaly. Aortic Atherosclerosis (ICD10-I70.0). Electronically Signed   By: Kellie Simmering DO   On: 12/21/2019 10:31   DG Swallowing Func-Speech Pathology  Result Date: 12/22/2019 Objective Swallowing Evaluation: Type of Study: MBS-Modified Barium Swallow Study  Patient  Details Name: STEPHANINE REAS MRN: [id number] Date of Birth: February 17, 1913 Today's Date: 12/22/2019 Time: SLP Start Time (ACUTE ONLY): 1305 -SLP Stop Time (ACUTE ONLY): 1325 SLP Time Calculation (min) (ACUTE ONLY): 20 min Past Medical History: Past Medical History: Diagnosis Date . Arthritis  . Atrial fibrillation ([place])  . Blood transfusion  . Chronic kidney disease   stage 3 renal disease . Diverticular disease  . DJD (degenerative joint disease)  . DJD (degenerative joint disease)  . DVT (deep venous thrombosis) ([place]) early 1980's  left medial upper thigh . Fracture, shoulder ~ 2003; 1997  left; right . GERD (gastroesophageal reflux disease)  . High cholesterol  . High cholesterol  . Hypertension  . Overactive bladder  . Pacemaker  . Skin cancer of face  . Skin cancer of face  . Syncope secondary to post termination pauses 05/03/11  "passed out; first time ever" . Urinary frequency  Past Surgical History: Past Surgical History: Procedure Laterality Date . APPENDECTOMY  ~ 1938 . BLADDER SUSPENSION  1985 . CARPAL TUNNEL RELEASE  04/18/2011  right; Procedure: CARPAL TUNNEL RELEASE;  Surgeon: Cammie Sickle., MD;  Location: [facility];  Service: Orthopedics;   Laterality: Right; . CARPAL TUNNEL RELEASE  12/05/2011  Procedure: CARPAL TUNNEL RELEASE;  Surgeon: Cammie Sickle., MD;  Location: [facility];  Service: Orthopedics;  Laterality: Left; . CATARACT EXTRACTION W/ INTRAOCULAR LENS  IMPLANT, BILATERAL   . HERNIA REPAIR  1986  "stomach" . JOINT REPLACEMENT    hips . PERMANENT PACEMAKER INSERTION N/A 05/06/2011  Procedure: PERMANENT PACEMAKER INSERTION;  Surgeon: Deboraha Sprang, MD;  Location[facility] C[facility];  Service: Cardiovascular;  Laterality: N/A; . SKIN CANCER EXCISION  2012  "twice" . TEMPORARY PACEMAKER INSERTION N/A 05/05/2011  Procedure: TEMPORARY PACEMAKER INSERTION;  Surgeon: Clent Demark, MD;  Location: [facility] CATH LAB;  Service: Cardiovascular;  Laterality: N/A; . TOTAL HIP ARTHROPLASTY    "2 on right; 1 on left" HPI: Pt is a 86 y.o. female with medical history significant of chronic diastolic CHF, LVEF 55 to 16% to third-degree diastolic dysfunction 60/6301, sick sinus syndrome status post PPM, chronic A. fib not on anticoagulation, HTN, CKD stage III, hearing impairment, who presented with increasing shortness of breath. Son reported patient has had " aspiration pneumonia 2 days ago". Son reported patient has had " aspiration pneumonia 2 days ago". CT chest: Cardiomegaly. Fused interstitial thickening and ground-glass opacities may reflect mild interstitial edema.  No data recorded Assessment / Plan / Recommendation CHL IP CLINICAL IMPRESSIONS 12/22/2019 Clinical Impression Pt presented with pharyngeal dysphagia characterized by reduced lingual retraction, a pharyngeal delay, and reduced anterior laryngeal movement. She demonstrated vallecular residue, pyriform sinus residue, penetration (PAS 3) and silent aspiration (PAS 8) of thin liquids, and nectar thick liquids via cup and straw. Pt's cough was weak and prompted coughing was ineffective in expelling penetrated/aspirated material. Amount of residue increased with advancement of solid  consistencies. Postural modifications were attempted, but pt was unable to consistently demonstrate these. A dysphagia 2 diet with honey thick liquids is recommended at this time. SLP will follow for dysphagia treatment.  SLP Visit Diagnosis Dysphagia, pharyngeal phase (R13.13) Attention and concentration deficit following -- Frontal lobe and executive function deficit following -- Impact on safety and function Moderate aspiration risk;Mild aspiration risk   CHL IP TREATMENT RECOMMENDATION 12/22/2019 Treatment Recommendations Therapy as outlined in treatment plan below   Prognosis 12/22/2019 Prognosis for Safe Diet Advancement Fair Barriers to Reach  Goals Severity of deficits Barriers/Prognosis Comment -- CHL IP DIET RECOMMENDATION 12/22/2019 SLP Diet Recommendations Dysphagia 2 (Fine chop) solids;Honey thick liquids Liquid Administration via Cup;Straw Medication Administration Crushed with puree Compensations Slow rate;Small sips/bites Postural Changes Remain semi-upright after after feeds/meals (Comment);Seated upright at 90 degrees   CHL IP OTHER RECOMMENDATIONS 12/22/2019 Recommended Consults -- Oral Care Recommendations Oral care BID Other Recommendations --   CHL IP FOLLOW UP RECOMMENDATIONS 12/22/2019 Follow up Recommendations Skilled Nursing facility   [facility] IP FREQUENCY AND DURATION 12/22/2019 Speech Therapy Frequency (ACUTE ONLY) min 2x/week Treatment Duration 2 weeks      CHL IP ORAL PHASE 12/22/2019 Oral Phase WFL Oral - Pudding Teaspoon -- Oral - Pudding Cup -- Oral - Honey Teaspoon -- Oral - Honey Cup -- Oral - Nectar Teaspoon -- Oral - Nectar Cup -- Oral - Nectar Straw -- Oral - Thin Teaspoon -- Oral - Thin Cup -- Oral - Thin Straw -- Oral - Puree -- Oral - Mech Soft -- Oral - Regular -- Oral - Multi-Consistency -- Oral - Pill -- Oral Phase - Comment --  CHL IP PHARYNGEAL PHASE 12/22/2019 Pharyngeal Phase Impaired Pharyngeal- Pudding Teaspoon -- Pharyngeal -- Pharyngeal- Pudding Cup -- Pharyngeal --  Pharyngeal- Honey Teaspoon -- Pharyngeal -- Pharyngeal- Honey Cup Pharyngeal residue - valleculae;Pharyngeal residue - pyriform;Reduced tongue base retraction;Reduced anterior laryngeal mobility Pharyngeal -- Pharyngeal- Nectar Teaspoon -- Pharyngeal -- Pharyngeal- Nectar Cup Pharyngeal residue - valleculae;Pharyngeal residue - pyriform;Reduced tongue base retraction;Reduced anterior laryngeal mobility;Penetration/Aspiration before swallow;Penetration/Aspiration during swallow Pharyngeal Material enters airway, remains ABOVE vocal cords and not ejected out;Material enters airway, passes BELOW cords without attempt by patient to eject out (silent aspiration) Pharyngeal- Nectar Straw Pharyngeal residue - valleculae;Pharyngeal residue - pyriform;Reduced tongue base retraction;Reduced anterior laryngeal mobility;Penetration/Aspiration during swallow Pharyngeal Material enters airway, passes BELOW cords without attempt by patient to eject out (silent aspiration) Pharyngeal- Thin Teaspoon -- Pharyngeal -- Pharyngeal- Thin Cup Pharyngeal residue - valleculae;Pharyngeal residue - pyriform;Reduced tongue base retraction;Reduced anterior laryngeal mobility;Penetration/Aspiration during swallow Pharyngeal Material enters airway, passes BELOW cords without attempt by patient to eject out (silent aspiration);Material enters airway, remains ABOVE vocal cords and not ejected out Pharyngeal- Thin Straw Pharyngeal residue - valleculae;Pharyngeal residue - pyriform;Reduced tongue base retraction;Reduced anterior laryngeal mobility;Penetration/Aspiration during swallow Pharyngeal Material enters airway, remains ABOVE vocal cords and not ejected out;Material enters airway, passes BELOW cords without attempt by patient to eject out (silent aspiration) Pharyngeal- Puree Pharyngeal residue - valleculae;Pharyngeal residue - pyriform;Reduced tongue base retraction;Reduced anterior laryngeal mobility Pharyngeal -- Pharyngeal- Mechanical Soft  Pharyngeal residue - valleculae;Pharyngeal residue - pyriform;Reduced tongue base retraction;Reduced anterior laryngeal mobility Pharyngeal -- Pharyngeal- Regular Pharyngeal residue - valleculae;Pharyngeal residue - pyriform;Reduced tongue base retraction;Reduced anterior laryngeal mobility Pharyngeal -- Pharyngeal- Multi-consistency -- Pharyngeal -- Pharyngeal- Pill Pharyngeal residue - valleculae;Pharyngeal residue - pyriform;Reduced tongue base retraction;Reduced anterior laryngeal mobility Pharyngeal -- Pharyngeal Comment --  ,Shanika I. Hardin Negus, [place], [place] Office number [phone] Pager [place] 12/22/2019, 4:46 PM                Subjective: No acute issues or events overnight denies chest pain, shortness of breath, nausea, vomiting, diarrhea, constipation, headache, fevers, chills.   Discharge Exam: Vitals:   12/25/19 0900 12/25/19 1115  BP: (!) 144/77   Pulse: 81   Resp: 16   Temp: 98.1 F (36.7 C) 97.6 F (36.4 C)  SpO2: 93%    Vitals:   12/25/19 0726 12/25/19 0807 12/25/19 0900 12/25/19 1115  BP:   [name])  144/77   Pulse:   81   Resp:   16   Temp: (!) 97.3 F (36.3 C)  98.1 F (36.7 C) 97.6 F (36.4 C)  TempSrc: Oral  Oral Oral  SpO2:  99% 93%   Weight:      Height:        General: Pt is alert, awake, not in acute distress Cardiovascular: RRR, S1/S2 +, no rubs, no gallops Respiratory: Diffuse bibasilar rhonchi without overt wheezes or rales. Abdominal: Soft, NT, ND, bowel sounds + Extremities: no edema, no cyanosis    The results of significant diagnostics from this hospitalization (including imaging, microbiology, ancillary and laboratory) are listed below for reference.     Microbiology: Recent Results (from the past 240 hour(s))  Respiratory Panel by RT PCR (Flu A&B, Covid) - Nasopharyngeal Swab     Status: None   Collection Time: 12/21/19 10:50 AM   Specimen: Nasopharyngeal Swab  Result Value Ref Range  Status   SARS Coronavirus 2 by RT PCR NEGATIVE NEGATIVE Final    Comment: (NOTE) SARS-CoV-2 target nucleic acids are NOT DETECTED.  The SARS-CoV-2 RNA is generally detectable in upper respiratoy specimens during the acute phase of infection. The lowest concentration of SARS-CoV-2 viral copies this assay can detect is 131 copies/mL. A negative result does not preclude SARS-Cov-2 infection and should not be used as the sole basis for treatment or other patient management decisions. A negative result may occur with  improper specimen collection/handling, submission of specimen other than nasopharyngeal swab, presence of viral mutation(s) within the areas targeted by this assay, and inadequate number of viral copies (<131 copies/mL). A negative result must be combined with clinical observations, patient history, and epidemiological information. The expected result is Negative.  Fact Sheet for Patients:  PinkCheek.be  Fact Sheet for Healthcare Providers:  GravelBags.it  This test is no t yet approved or cleared by the [place] FDA and  has been authorized for detection and/or diagnosis of SARS-CoV-2 by FDA under an Emergency Use Authorization (EUA). This EUA will remain  in effect (meaning this test can be used) for the duration of the COVID-19 declaration under Section 564(b)(1) of the Act, 21 U.S.C. section 360bbb-3(b)(1), unless the authorization is terminated or revoked sooner.     Influenza A by PCR NEGATIVE NEGATIVE Final   Influenza B by PCR NEGATIVE NEGATIVE Final    Comment: (NOTE) The Xpert Xpress SARS-CoV-2/FLU/RSV assay is intended as an aid in  the diagnosis of influenza from Nasopharyngeal swab specimens and  should not be used as a sole basis for treatment. Nasal washings and  aspirates are unacceptable for Xpert Xpress SARS-CoV-2/FLU/RSV  testing.  Fact Sheet for  Patients: PinkCheek.be  Fact Sheet for Healthcare Providers: GravelBags.it  This test is not yet approved or cleared by the [place] FDA and  has been authorized for detection and/or diagnosis of SARS-CoV-2 by  FDA under an Emergency Use Authorization (EUA). This EUA will remain  in effect (meaning this test can be used) for the duration of the  Covid-19 declaration under Section 564(b)(1) of the Act, 21  U.S.C. section 360bbb-3(b)(1), unless the authorization is  terminated or revoked. Performed at [facility], [place] [address] [place], [place] [address]   MRSA PCR Screening     Status: None   Collection Time: 12/22/19  5:16 AM   Specimen: Nasopharyngeal  Result Value Ref Range Status   MRSA by PCR NEGATIVE NEGATIVE Final    Comment:  The GeneXpert MRSA Assay (FDA approved for NASAL specimens only), is one component of a comprehensive MRSA colonization surveillance program. It is not intended to diagnose MRSA infection nor to guide or monitor treatment for MRSA infections. Performed at [facility], [place] [address] [place], [place] [address]      Labs: BNP (last 3 results) Recent Labs    11/14/19 1143 12/21/19 1520  BNP 525.0* 275.1*   Basic Metabolic Panel: Recent Labs  Lab 12/21/19 1309 12/22/19 0345 12/23/19 0124 12/24/19 0332 12/25/19 0457  NA 136 138 139 139 141  K 5.2* 5.4* 4.1 4.7 5.2*  CL 99 96* 95* 93* 91*  CO2 29 32 36* 36* 41*  GLUCOSE 99 100* 91 88 100*  BUN 29* 29* 32* 33* 42*  CREATININE 1.11* 1.17* 1.19* 1.36* 1.64*  CALCIUM 10.1 9.5 9.5 9.3 10.0   Liver Function Tests: No results for input(s): AST, ALT, ALKPHOS, BILITOT, PROT, ALBUMIN in the last 168 hours. No results for input(s): LIPASE, AMYLASE in the last 168 hours. No results for input(s): AMMONIA in the last 168 hours. CBC: Recent Labs  Lab 12/21/19 1050  WBC 8.0  NEUTROABS 6.3  HGB 12.1  HCT  38.6  MCV 98.0  PLT 192   Cardiac Enzymes: No results for input(s): CKTOTAL, CKMB, CKMBINDEX, TROPONINI in the last 168 hours. BNP: Invalid input(s): POCBNP CBG: No results for input(s): GLUCAP in the last 168 hours. D-Dimer No results for input(s): DDIMER in the last 72 hours. Hgb A1c No results for input(s): HGBA1C in the last 72 hours. Lipid Profile No results for input(s): CHOL, HDL, LDLCALC, TRIG, CHOLHDL, LDLDIRECT in the last 72 hours. Thyroid function studies No results for input(s): TSH, T4TOTAL, T3FREE, THYROIDAB in the last 72 hours.  Invalid input(s): FREET3 Anemia work up No results for input(s): VITAMINB12, FOLATE, FERRITIN, TIBC, IRON, RETICCTPCT in the last 72 hours. Urinalysis    Component Value Date/Time   COLORURINE YELLOW 11/09/2015 1036   APPEARANCEUR CLEAR 11/09/2015 1036   LABSPEC 1.014 11/09/2015 1036   PHURINE 6.5 11/09/2015 1036   GLUCOSEU NEGATIVE 11/09/2015 1036   HGBUR NEGATIVE 11/09/2015 1036   BILIRUBINUR NEGATIVE 11/09/2015 1036   KETONESUR NEGATIVE 11/09/2015 1036   PROTEINUR NEGATIVE 11/09/2015 1036   UROBILINOGEN 0.2 05/03/2011 1950   NITRITE NEGATIVE 11/09/2015 1036   LEUKOCYTESUR NEGATIVE 11/09/2015 1036   Sepsis Labs Invalid input(s): PROCALCITONIN,  WBC,  LACTICIDVEN Microbiology Recent Results (from the past 240 hour(s))  Respiratory Panel by RT PCR (Flu A&B, Covid) - Nasopharyngeal Swab     Status: None   Collection Time: 12/21/19 10:50 AM   Specimen: Nasopharyngeal Swab  Result Value Ref Range Status   SARS Coronavirus 2 by RT PCR NEGATIVE NEGATIVE Final    Comment: (NOTE) SARS-CoV-2 target nucleic acids are NOT DETECTED.  The SARS-CoV-2 RNA is generally detectable in upper respiratoy specimens during the acute phase of infection. The lowest concentration of SARS-CoV-2 viral copies this assay can detect is 131 copies/mL. A negative result does not preclude SARS-Cov-2 infection and should not be used as the sole basis for  treatment or other patient management decisions. A negative result may occur with  improper specimen collection/handling, submission of specimen other than nasopharyngeal swab, presence of viral mutation(s) within the areas targeted by this assay, and inadequate number of viral copies (<131 copies/mL). A negative result must be combined with clinical observations, patient history, and epidemiological information. The expected result is Negative.  Fact Sheet for Patients:  PinkCheek.be  Fact Sheet for Healthcare Providers:  GravelBags.it  This test is no t yet approved or cleared by the [place] FDA and  has been authorized for detection and/or diagnosis of SARS-CoV-2 by FDA under an Emergency Use Authorization (EUA). This EUA will remain  in effect (meaning this test can be used) for the duration of the COVID-19 declaration under Section 564(b)(1) of the Act, 21 U.S.C. section 360bbb-3(b)(1), unless the authorization is terminated or revoked sooner.     Influenza A by PCR NEGATIVE NEGATIVE Final   Influenza B by PCR NEGATIVE NEGATIVE Final    Comment: (NOTE) The Xpert Xpress SARS-CoV-2/FLU/RSV assay is intended as an aid in  the diagnosis of influenza from Nasopharyngeal swab specimens and  should not be used as a sole basis for treatment. Nasal washings and  aspirates are unacceptable for Xpert Xpress SARS-CoV-2/FLU/RSV  testing.  Fact Sheet for Patients: PinkCheek.be  Fact Sheet for Healthcare Providers: GravelBags.it  This test is not yet approved or cleared by the [place] FDA and  has been authorized for detection and/or diagnosis of SARS-CoV-2 by  FDA under an Emergency Use Authorization (EUA). This EUA will remain  in effect (meaning this test can be used) for the duration of the  Covid-19 declaration under Section 564(b)(1) of the Act, 21   U.S.C. section 360bbb-3(b)(1), unless the authorization is  terminated or revoked. Performed at [facility], [place] [address] [place], [place] [address]   MRSA PCR Screening     Status: None   Collection Time: 12/22/19  5:16 AM   Specimen: Nasopharyngeal  Result Value Ref Range Status   MRSA by PCR NEGATIVE NEGATIVE Final    Comment:        The GeneXpert MRSA Assay (FDA approved for NASAL specimens only), is one component of a comprehensive MRSA colonization surveillance program. It is not intended to diagnose MRSA infection nor to guide or monitor treatment for MRSA infections. Performed at [facility], [place] [address], [place], [place] [address]      Time coordinating discharge: Over 30 minutes  SIGNED:   Little Ishikawa, DO [facility] 12/25/2019, 11:18 AM Pager   If 7PM-7AM, please contact night-coverage [email or web address]

## 2019-12-26 DIAGNOSIS — J9601 Acute respiratory failure with hypoxia: Secondary | ICD-10-CM | POA: Diagnosis not present

## 2019-12-26 LAB — BASIC METABOLIC PANEL
Anion gap: 14 (ref 5–15)
BUN: 53 mg/dL — ABNORMAL HIGH (ref 8–23)
CO2: 39 mmol/L — ABNORMAL HIGH (ref 22–32)
Calcium: 9.8 mg/dL (ref 8.9–10.3)
Chloride: 89 mmol/L — ABNORMAL LOW (ref 98–111)
Creatinine, Ser: 1.67 mg/dL — ABNORMAL HIGH (ref 0.44–1.00)
GFR, Estimated: 27 mL/min — ABNORMAL LOW (ref >60)
Glucose, Bld: 105 mg/dL — ABNORMAL HIGH (ref 70–99)
Potassium: 4.3 mmol/L (ref 3.5–5.1)
Sodium: 142 mmol/L (ref 135–145)

## 2019-12-26 LAB — SARS CORONAVIRUS 2 BY RT PCR (HOSPITAL ORDER, PERFORMED IN ~~LOC~~ HOSPITAL LAB): SARS Coronavirus 2: NEGATIVE

## 2019-12-26 NOTE — Progress Notes (Signed)
Attempted report to [facility]

## 2019-12-26 NOTE — NC FL2 (Addendum)
[place] MEDICAID FL2 LEVEL OF CARE SCREENING TOOL     IDENTIFICATION  Patient Name: Julie Mcpherson Birthdate: 02/03/1914 Sex: female Admission Date (Current Location): 12/21/2019  [facility] and [place] Number:  Herbalist and Address:  [facility], [place] [address], [place], [place] [address]      Provider Number: [id number]  Attending Physician Name and Address:  Dwyane Dee, MD  Relative Name and Phone Number:  [name], son, [phone]    Current Level of Care: Hospital Recommended Level of Care: [place] Prior Approval Number:    Date Approved/Denied:   PASRR Number: [id number]  Discharge Plan: SNF    Current Diagnoses: Patient Active Problem List   Diagnosis Date Noted  . Malnutrition of moderate degree 12/23/2019  . CHF (congestive heart failure) ([place]) 12/21/2019  . Overactive bladder   . DJD (degenerative joint disease)   . Pressure injury of right buttock, stage 1 11/22/2019  . Chronic kidney disease, stage 3b ([place]) 11/22/2019  . Dilutional hyponatremia 11/22/2019  . Iron deficiency anemia 11/22/2019  . Pressure injury of skin 11/15/2019  . Acute respiratory failure with hypoxia ([place]) 11/15/2019  . Acute diastolic CHF (congestive heart failure) ([place]) 11/15/2019  . Acute pulmonary edema (H[facility])   . Palliative care by specialist   . Goals of care, counseling/discussion   . Acute CHF (congestive heart failure) ([place]) 11/14/2019  . A-fib ([place]) 11/02/2019  . Essential hypertension 06/28/2013  . Sinus node dysfunction ([place]) 08/22/2011  . Pacemaker-St.Jude 08/15/2011  . New onset atrial fibrillation ([place]) 05/05/2011    Orientation RESPIRATION BLADDER Height & Weight     Self, Time, Situation, Place  O2 (Nasal cannula 1.5L) Incontinent, External catheter Weight: 114 lb 13.8 oz (52.1 kg) Height:  5\' 6"  (167.6 cm)  BEHAVIORAL SYMPTOMS/MOOD NEUROLOGICAL BOWEL NUTRITION STATUS      Continent Diet (Please  see DC Summary)  AMBULATORY STATUS COMMUNICATION OF NEEDS Skin   Extensive Assist Verbally PU Stage and Appropriate Care (Stage III on both heels; Stage I on buttocks; foam dressing)                       Personal Care Assistance Level of Assistance  Bathing, Feeding, Dressing Bathing Assistance: Maximum assistance Feeding assistance: Limited assistance Dressing Assistance: Limited assistance     Functional Limitations Info  Sight, Hearing Sight Info: Impaired Hearing Info: Impaired      SPECIAL CARE FACTORS FREQUENCY  PT (By licensed PT), OT (By licensed OT)     PT Frequency: 4x/week OT Frequency: 3x/week            Contractures Contractures Info: Not present    Additional Factors Info  Code Status, Allergies Code Status Info: DNR Allergies Info: Codeine, Statins, Sulfa Antibiotics, Other, Tetanus Toxoids           Current Medications (12/26/2019):  This is the current hospital active medication list Current Facility-Administered Medications  Medication Dose Route Frequency Provider Last Rate Last Admin  . 0.9 %  sodium chloride infusion  250 mL Intravenous PRN Wynetta Fines T, MD 10 mL/hr at 12/26/19 0428 250 mL at 12/26/19 0428  . acetaminophen (TYLENOL) tablet 500 mg  500 mg Oral BID Wynetta Fines T, MD   500 mg at 12/26/19 0944  . acetaminophen (TYLENOL) tablet 650 mg  650 mg Oral Q4H PRN Wynetta Fines T, MD      . amLODipine (NORVASC) tablet 2.5 mg  2.5 mg Oral  Daily Wynetta Fines T, MD   2.5 mg at 12/26/19 0944  . Ampicillin-Sulbactam (UNASYN) 3 g in sodium chloride 0.9 % 100 mL IVPB  3 g Intravenous Q12H Duanne Limerick, RPH 200 mL/hr at 12/26/19 0429 3 g at 12/26/19 0429  . budesonide (PULMICORT) nebulizer solution 0.25 mg  0.25 mg Nebulization BID Wynetta Fines T, MD   0.25 mg at 12/26/19 0816  . calcium carbonate (OS-CAL - dosed in mg of elemental calcium) tablet 625 mg  625 mg Oral Q breakfast Wynetta Fines T, MD   625 mg at 12/26/19 0944  . cholecalciferol  (VITAMIN D3) tablet 1,000 Units  1,000 Units Oral Daily Lequita Halt, MD   1,000 Units at 12/26/19 0945  . collagenase (SANTYL) ointment   Topical Daily Little Ishikawa, MD   Given at 12/26/19 0945  . furosemide (LASIX) injection 20 mg  20 mg Intravenous BID Little Ishikawa, MD   20 mg at 12/26/19 0945  . gabapentin (NEURONTIN) capsule 100 mg  100 mg Oral TID Wynetta Fines T, MD   100 mg at 12/26/19 0944  . heparin injection 5,000 Units  5,000 Units Subcutaneous Q12H Lequita Halt, MD   5,000 Units at 12/26/19 0944  . hydrALAZINE (APRESOLINE) tablet 25 mg  25 mg Oral Q6H PRN Wynetta Fines T, MD      . ipratropium (ATROVENT) nebulizer solution 0.5 mg  0.5 mg Nebulization BID Wynetta Fines T, MD   0.5 mg at 12/26/19 0816  . magnesium hydroxide (MILK OF MAGNESIA) suspension 30 mL  30 mL Oral Daily PRN Wynetta Fines T, MD      . metoprolol succinate (TOPROL-XL) 24 hr tablet 25 mg  25 mg Oral Daily Wynetta Fines T, MD   25 mg at 12/26/19 0945  . mirabegron ER (MYRBETRIQ) tablet 50 mg  50 mg Oral QHS Wynetta Fines T, MD   50 mg at 12/25/19 2235  . multivitamin with minerals tablet 1 tablet  1 tablet Oral Daily Lequita Halt, MD   1 tablet at 12/26/19 0945  . ondansetron (ZOFRAN) injection 4 mg  4 mg Intravenous Q6H PRN Wynetta Fines T, MD      . polyethylene glycol (MIRALAX / GLYCOLAX) packet 17 g  17 g Oral Daily PRN Wynetta Fines T, MD      . prednisoLONE tablet 20 mg  20 mg Oral Daily Wynetta Fines T, MD   20 mg at 12/26/19 0944  . Resource ThickenUp Clear   Oral PRN Little Ishikawa, MD      . sodium chloride flush (NS) 0.9 % injection 3 mL  3 mL Intravenous Q12H Wynetta Fines T, MD   3 mL at 12/26/19 0946  . sodium chloride flush (NS) 0.9 % injection 3 mL  3 mL Intravenous PRN Lequita Halt, MD         Discharge Medications: Please see discharge summary for a list of discharge medications.  Relevant Imaging Results:  Relevant Lab Results:   Additional Information SSN [id number]  Loreta Ave, LCSWA

## 2019-12-26 NOTE — TOC Transition Note (Signed)
Transition of Care [facility]) - CM/SW Discharge Note   Patient Details  Name: Julie Mcpherson MRN: [id number] Date of Birth: 12/25/1913  [facility] CM/SW Contact:  Loreta Ave, [place] Phone Number: 12/26/2019, 9:54 AM   Clinical Narrative:    Patient will DC to: [facility] Anticipated DC date: 12/26/19 Family notified: Merleen Milliner Transport by: Corey Harold   Per MD patient ready for DC to [facility] RN to call report prior to discharge [phone]. RN, patient, patient's family, and facility notified of DC. Discharge Summary and FL2 sent to facility. DC packet on chart. Ambulance transport requested for patient.   CSW will sign off for now as social work intervention is no longer needed. Please consult [place] again if new needs arise.     Final next level of care: Skilled Nursing Facility Barriers to Discharge: Barriers Resolved   Patient Goals and CMS Choice Patient states their goals for this hospitalization and ongoing recovery are:: Get back to her apt CMS Medicare.gov Compare Post Acute Care list provided to:: Patient Represenative (must comment) (Son, [name]) Choice offered to / list presented to : Adult Children  Discharge Placement PASRR number recieved: 05/06/11            Patient chooses bed at: [facility] Patient to be transferred to facility by: [facility] Name of family member notified: Lauralyn Shadowens Patient and family notified of of transfer: 12/26/19  Discharge Plan and Services                                     Social Determinants of Health (SDOH) Interventions     Readmission Risk Interventions No flowsheet data found.

## 2019-12-26 NOTE — Discharge Summary (Signed)
Physician Discharge Summary   Julie Mcpherson LKG:[id number] DOB: 17-Dec-1913 DOA: 12/21/2019  PCP: Lajean Manes, MD  Admit date: 12/21/2019 Discharge date: 12/26/2019  Admitted From: SNF Disposition:  SNF Discharging physician: Dwyane Dee, MD  Recommendations for Outpatient Follow-up:  1. Finish prednisone course and Augmentin course   Patient discharged to SNF in Discharge Condition: stable CODE STATUS: DNR Diet recommendation:  Diet Orders (From admission, onward)    Start     Ordered   12/25/19 0000  Diet - low sodium heart healthy        12/25/19 1114   12/22/19 1424  DIET DYS 2 Room service appropriate? Yes with Assist; Fluid consistency: Honey Thick  Diet effective now       Question Answer Comment  Room service appropriate? Yes with Assist   Fluid consistency: Honey Thick      12/22/19 1423          Hospital Course: Julie Braver Hilliardis a 86 y.o.femalewith medical history significant ofchronic diastolic CHF,LVEF 55 to 40% to third-degree diastolic dysfunction 34/7425,ZDGL sinus syndrome status post PPM, chronic A. fib not on anticoagulation, HTN, CKD stage III, hearing impairment, presented with increasing shortness of breath.Patient has significant heart rate 1, most history provided by patient's son over at bedside. Son reported patient has had "aspiration pneumonia 2 days ago"was placed on p.o. antibiotics,main symptoms appears to be increasing shortness of breath. Denied any fever chills.Patient had pneumonia before, but not recent years. Patient was hospitalized 1 month ago for fluid overload,and was treated with IV Lasix which resolved. Patient was discharged back,and not on routine diuresis because the patient age and other comorbidities. In ED patient stabilized on 3 L, but very quickly desats with minimal exertion. Chest x-ray showed bilateral lower field infiltrate suspect for pneumonia versus CHF decompensation. Vancomycin and cefepime  were given in the ED. WBC 8.0, creatinine 1.1, potassium 5.2.  Patient admitted as above with acute hypoxic respiratory failure in the setting of aspiration pneumonia versus pneumonitis.  Patient has what appears to be chronic dysphagia, evaluated by speech therapy noted to require dysphagia 2 diet with fine chopped foods and honey thick liquids.  Patient's p.o. intake drastically improved thereafter, she had no further symptoms of choking or dysphagia.  Her aspiration pneumonia continues to improve, she has been weaned down on oxygen currently requiring 2 L nasal cannula at rest and exertion likely in the setting of chronic aspiration, will hopefully be able to wean off of oxygen in the next few weeks but given advanced age and history of chronic aspiration this may be less likely.  Defer to PCP for repeat evaluation and imaging as well as oxygen weaning.  Patient will be discharged on remainder of antibiotic course with Augmentin, new oxygen at 2 L nasal cannula around-the-clock as well as steroid taper.  Patient otherwise stable and agreeable for discharge to SNF.   Principal Diagnosis: Acute respiratory failure with hypoxia [facility])  Discharge Diagnoses: Active Hospital Problems   Diagnosis Date Noted  . Acute respiratory failure with hypoxia ([place]) 11/15/2019  . Malnutrition of moderate degree 12/23/2019  . CHF (congestive heart failure) ([place]) 12/21/2019  . Chronic kidney disease, stage 3b ([place]) 11/22/2019  . Pressure injury of skin 11/15/2019  . Goals of care, counseling/discussion   . A-fib ([place]) 11/02/2019  . Essential hypertension 06/28/2013    Resolved Hospital Problems  No resolved problems to display.    Discharge Instructions    Call MD for:  difficulty breathing, headache or  visual disturbances   Complete by: As directed    Call MD for:  persistant nausea and vomiting   Complete by: As directed    Call MD for:  temperature >100.4   Complete by: As directed    Diet - low  sodium heart healthy   Complete by: As directed    Discharge wound care:   Complete by: As directed    Foam dressing  Every 3 days:  Silicone foam dressings to the right heel change every 3 days. Assess under dressings each shift for any acute changes in the wounds.  Wound care: every shift      Comments: Apply Santyl to the left heel daily liberally. Top with saline moist gauze and dry dressing. Change daily.   For home use only DME oxygen   Complete by: As directed    Length of Need: 6 Months   Mode or (Route): Nasal cannula   Liters per Minute: 2   Frequency: Continuous (stationary and portable oxygen unit needed)   Oxygen conserving device: No   Oxygen delivery system: Gas   Increase activity slowly   Complete by: As directed      Allergies as of 12/26/2019      Reactions   Codeine Other (See Comments)   "can't sleep; I climb the walls"   Statins Other (See Comments)   "ocular migraine headaches"   Sulfa Antibiotics Anaphylaxis, Itching, Rash   Other    Adsorbed-listed on MAR no reaction listed   Tetanus Toxoids Other (See Comments)   "think it started turning red around where I got shot; they gave me small doses and I did fine"      Medication List    STOP taking these medications   cephALEXin 500 MG capsule Commonly known as: KEFLEX     TAKE these medications   acetaminophen 500 MG tablet Commonly known as: TYLENOL Take 500 mg by mouth 2 (two) times daily.   amLODipine 2.5 MG tablet Commonly known as: NORVASC Take 2.5 mg by mouth daily. What changed: Another medication with the same name was removed. Continue taking this medication, and follow the directions you see here.   amoxicillin-clavulanate 875-125 MG tablet Commonly known as: Augmentin Take 1 tablet by mouth 2 (two) times daily for 3 days.   calcium carbonate 600 MG Tabs tablet Commonly known as: OS-CAL Take 600 mg by mouth daily with breakfast.   cholecalciferol 1000 units tablet Commonly  known as: VITAMIN D Take 1,000 Units by mouth daily.   gabapentin 100 MG capsule Commonly known as: Neurontin Take 1 capsule (100 mg total) by mouth 3 (three) times daily.   magnesium hydroxide 400 MG/5ML suspension Commonly known as: MILK OF MAGNESIA Take 30 mLs by mouth daily as needed for mild constipation (x 48 hours,if no relief,add miralax by mouth daily for 3 days.).   metoprolol succinate 25 MG 24 hr tablet Commonly known as: TOPROL-XL Take 1 tablet (25 mg total) by mouth daily.   moxifloxacin 0.5 % ophthalmic solution Commonly known as: VIGAMOX Place 1 drop into the left eye 4 (four) times daily for 10 days. Course for 10 days began 12/13/19   multivitamin with minerals Tabs tablet Take 1 tablet by mouth daily.   Myrbetriq 50 MG Tb24 tablet Generic drug: mirabegron ER Take 1 tablet (50 mg total) by mouth daily. What changed: when to take this   OVER THE COUNTER MEDICATION Take 120 mLs by mouth in the morning and at bedtime. Med  pass 2.0   polyethylene glycol 17 g packet Commonly known as: MIRALAX / GLYCOLAX Take 17 g by mouth daily as needed for mild constipation (for 3 days,if no relief by milk of mag).   predniSONE 10 MG tablet Commonly known as: DELTASONE Take 2 tablets (20 mg total) by mouth daily for 3 days, THEN 1 tablet (10 mg total) daily for 3 days. Start taking on: December 25, 2019            Durable Medical Equipment  (From admission, onward)         Start     Ordered   12/25/19 0000  For home use only DME oxygen       Question Answer Comment  Length of Need 6 Months   Mode or (Route) Nasal cannula   Liters per Minute 2   Frequency Continuous (stationary and portable oxygen unit needed)   Oxygen conserving device No   Oxygen delivery system Gas      12/25/19 1115           Discharge Care Instructions  (From admission, onward)         Start     Ordered   12/25/19 0000  Discharge wound care:       Comments: Foam dressing  Every  3 days:  Silicone foam dressings to the right heel change every 3 days. Assess under dressings each shift for any acute changes in the wounds.  Wound care: every shift      Comments: Apply Santyl to the left heel daily liberally. Top with saline moist gauze and dry dressing. Change daily.   12/25/19 1114          Allergies  Allergen Reactions  . Codeine Other (See Comments)    "can't sleep; I climb the walls"  . Statins Other (See Comments)    "ocular migraine headaches"  . Sulfa Antibiotics Anaphylaxis, Itching and Rash  . Other     Adsorbed-listed on MAR no reaction listed  . Tetanus Toxoids Other (See Comments)    "think it started turning red around where I got shot; they gave me small doses and I did fine"    Consultations: none  Discharge Exam: BP 98/62 (BP Location: Left Arm)   Pulse 76   Temp (!) 97.3 F (36.3 C) (Oral)   Resp 14   Ht 5\' 6"  (1.676 m)   Wt 52.1 kg   SpO2 100%   BMI 18.54 kg/m  General appearance: alert, cooperative, no distress and hard of hearing Head: Normocephalic, without obvious abnormality, atraumatic Eyes: EOMI Lungs: clear to auscultation bilaterally Heart: regular rate and rhythm and S1, S2 normal Abdomen: normal findings: bowel sounds normal and soft, non-tender Extremities: no edema Skin: mobility and turgor normal Neurologic: moves all 4 extremities  The results of significant diagnostics from this hospitalization (including imaging, microbiology, ancillary and laboratory) are listed below for reference.   Microbiology: Recent Results (from the past 240 hour(s))  Respiratory Panel by RT PCR (Flu A&B, Covid) - Nasopharyngeal Swab     Status: None   Collection Time: 12/21/19 10:50 AM   Specimen: Nasopharyngeal Swab  Result Value Ref Range Status   SARS Coronavirus 2 by RT PCR NEGATIVE NEGATIVE Final    Comment: (NOTE) SARS-CoV-2 target nucleic acids are NOT DETECTED.  The SARS-CoV-2 RNA is generally detectable in upper  respiratoy specimens during the acute phase of infection. The lowest concentration of SARS-CoV-2 viral copies this assay can detect is 131  copies/mL. A negative result does not preclude SARS-Cov-2 infection and should not be used as the sole basis for treatment or other patient management decisions. A negative result may occur with  improper specimen collection/handling, submission of specimen other than nasopharyngeal swab, presence of viral mutation(s) within the areas targeted by this assay, and inadequate number of viral copies (<131 copies/mL). A negative result must be combined with clinical observations, patient history, and epidemiological information. The expected result is Negative.  Fact Sheet for Patients:  PinkCheek.be  Fact Sheet for Healthcare Providers:  GravelBags.it  This test is no t yet approved or cleared by the [place] FDA and  has been authorized for detection and/or diagnosis of SARS-CoV-2 by FDA under an Emergency Use Authorization (EUA). This EUA will remain  in effect (meaning this test can be used) for the duration of the COVID-19 declaration under Section 564(b)(1) of the Act, 21 U.S.C. section 360bbb-3(b)(1), unless the authorization is terminated or revoked sooner.     Influenza A by PCR NEGATIVE NEGATIVE Final   Influenza B by PCR NEGATIVE NEGATIVE Final    Comment: (NOTE) The Xpert Xpress SARS-CoV-2/FLU/RSV assay is intended as an aid in  the diagnosis of influenza from Nasopharyngeal swab specimens and  should not be used as a sole basis for treatment. Nasal washings and  aspirates are unacceptable for Xpert Xpress SARS-CoV-2/FLU/RSV  testing.  Fact Sheet for Patients: PinkCheek.be  Fact Sheet for Healthcare Providers: GravelBags.it  This test is not yet approved or cleared by the [place] FDA and  has been  authorized for detection and/or diagnosis of SARS-CoV-2 by  FDA under an Emergency Use Authorization (EUA). This EUA will remain  in effect (meaning this test can be used) for the duration of the  Covid-19 declaration under Section 564(b)(1) of the Act, 21  U.S.C. section 360bbb-3(b)(1), unless the authorization is  terminated or revoked. Performed at [facility], [place] [address], [place], [place] [address]   MRSA PCR Screening     Status: None   Collection Time: 12/22/19  5:16 AM   Specimen: Nasopharyngeal  Result Value Ref Range Status   MRSA by PCR NEGATIVE NEGATIVE Final    Comment:        The GeneXpert MRSA Assay (FDA approved for NASAL specimens only), is one component of a comprehensive MRSA colonization surveillance program. It is not intended to diagnose MRSA infection nor to guide or monitor treatment for MRSA infections. Performed at [facility], [place] [address] [place], [place] [address]      Labs: BNP (last 3 results) Recent Labs    11/14/19 1143 12/21/19 1520  BNP 525.0* 010.9*   Basic Metabolic Panel: Recent Labs  Lab 12/22/19 0345 12/23/19 0124 12/24/19 0332 12/25/19 0457 12/26/19 0506  NA 138 139 139 141 142  K 5.4* 4.1 4.7 5.2* 4.3  CL 96* 95* 93* 91* 89*  CO2 32 36* 36* 41* 39*  GLUCOSE 100* 91 88 100* 105*  BUN 29* 32* 33* 42* 53*  CREATININE 1.17* 1.19* 1.36* 1.64* 1.67*  CALCIUM 9.5 9.5 9.3 10.0 9.8   Liver Function Tests: No results for input(s): AST, ALT, ALKPHOS, BILITOT, PROT, ALBUMIN in the last 168 hours. No results for input(s): LIPASE, AMYLASE in the last 168 hours. No results for input(s): AMMONIA in the last 168 hours. CBC: Recent Labs  Lab 12/21/19 1050  WBC 8.0  NEUTROABS 6.3  HGB 12.1  HCT 38.6  MCV 98.0  PLT 192  Cardiac Enzymes: No results for input(s): CKTOTAL, CKMB, CKMBINDEX, TROPONINI in the last 168 hours. BNP: Invalid input(s): POCBNP CBG: No results for input(s): GLUCAP in the last 168  hours. D-Dimer No results for input(s): DDIMER in the last 72 hours. Hgb A1c No results for input(s): HGBA1C in the last 72 hours. Lipid Profile No results for input(s): CHOL, HDL, LDLCALC, TRIG, CHOLHDL, LDLDIRECT in the last 72 hours. Thyroid function studies No results for input(s): TSH, T4TOTAL, T3FREE, THYROIDAB in the last 72 hours.  Invalid input(s): FREET3 Anemia work up No results for input(s): VITAMINB12, FOLATE, FERRITIN, TIBC, IRON, RETICCTPCT in the last 72 hours. Urinalysis    Component Value Date/Time   COLORURINE YELLOW 11/09/2015 1036   APPEARANCEUR CLEAR 11/09/2015 1036   LABSPEC 1.014 11/09/2015 1036   PHURINE 6.5 11/09/2015 1036   GLUCOSEU NEGATIVE 11/09/2015 1036   HGBUR NEGATIVE 11/09/2015 1036   BILIRUBINUR NEGATIVE 11/09/2015 1036   KETONESUR NEGATIVE 11/09/2015 1036   PROTEINUR NEGATIVE 11/09/2015 1036   UROBILINOGEN 0.2 05/03/2011 1950   NITRITE NEGATIVE 11/09/2015 1036   LEUKOCYTESUR NEGATIVE 11/09/2015 1036   Sepsis Labs Invalid input(s): PROCALCITONIN,  WBC,  LACTICIDVEN Microbiology Recent Results (from the past 240 hour(s))  Respiratory Panel by RT PCR (Flu A&B, Covid) - Nasopharyngeal Swab     Status: None   Collection Time: 12/21/19 10:50 AM   Specimen: Nasopharyngeal Swab  Result Value Ref Range Status   SARS Coronavirus 2 by RT PCR NEGATIVE NEGATIVE Final    Comment: (NOTE) SARS-CoV-2 target nucleic acids are NOT DETECTED.  The SARS-CoV-2 RNA is generally detectable in upper respiratoy specimens during the acute phase of infection. The lowest concentration of SARS-CoV-2 viral copies this assay can detect is 131 copies/mL. A negative result does not preclude SARS-Cov-2 infection and should not be used as the sole basis for treatment or other patient management decisions. A negative result may occur with  improper specimen collection/handling, submission of specimen other than nasopharyngeal swab, presence of viral mutation(s) within  the areas targeted by this assay, and inadequate number of viral copies (<131 copies/mL). A negative result must be combined with clinical observations, patient history, and epidemiological information. The expected result is Negative.  Fact Sheet for Patients:  PinkCheek.be  Fact Sheet for Healthcare Providers:  GravelBags.it  This test is no t yet approved or cleared by the [place] FDA and  has been authorized for detection and/or diagnosis of SARS-CoV-2 by FDA under an Emergency Use Authorization (EUA). This EUA will remain  in effect (meaning this test can be used) for the duration of the COVID-19 declaration under Section 564(b)(1) of the Act, 21 U.S.C. section 360bbb-3(b)(1), unless the authorization is terminated or revoked sooner.     Influenza A by PCR NEGATIVE NEGATIVE Final   Influenza B by PCR NEGATIVE NEGATIVE Final    Comment: (NOTE) The Xpert Xpress SARS-CoV-2/FLU/RSV assay is intended as an aid in  the diagnosis of influenza from Nasopharyngeal swab specimens and  should not be used as a sole basis for treatment. Nasal washings and  aspirates are unacceptable for Xpert Xpress SARS-CoV-2/FLU/RSV  testing.  Fact Sheet for Patients: PinkCheek.be  Fact Sheet for Healthcare Providers: GravelBags.it  This test is not yet approved or cleared by the [place] FDA and  has been authorized for detection and/or diagnosis of SARS-CoV-2 by  FDA under an Emergency Use Authorization (EUA). This EUA will remain  in effect (meaning this test can be used) for the duration of the  Covid-19 declaration under Section 564(b)(1) of the Act, 21  U.S.C. section 360bbb-3(b)(1), unless the authorization is  terminated or revoked. Performed at [facility], [place] [address], [place], [place] [address]   MRSA PCR Screening     Status: None   Collection  Time: 12/22/19  5:16 AM   Specimen: Nasopharyngeal  Result Value Ref Range Status   MRSA by PCR NEGATIVE NEGATIVE Final    Comment:        The GeneXpert MRSA Assay (FDA approved for NASAL specimens only), is one component of a comprehensive MRSA colonization surveillance program. It is not intended to diagnose MRSA infection nor to guide or monitor treatment for MRSA infections. Performed at [facility], [place] [address], [place], [place] [address]     Procedures/Studies: CT CHEST WO CONTRAST  Result Date: 12/21/2019 CLINICAL DATA:  Cardiomegaly, follow-up EXAM: CT CHEST WITHOUT CONTRAST TECHNIQUE: Multidetector CT imaging of the chest was performed following the standard protocol without IV contrast. COMPARISON:  Chest x-ray today FINDINGS: Cardiovascular: Cardiomegaly. Pacer wires noted in the right heart. Densely calcified mitral valve annulus and aorta. Scattered coronary artery calcifications. Mediastinum/Nodes: Borderline sized mediastinal lymph nodes. Right paratracheal node has a short axis diameter of 10 mm. No axillary adenopathy. Lungs/Pleura: Areas of peripheral interstitial thickening most pronounced in the mid and lower lung zones, likely fibrosis. Small bilateral pleural effusions. Diffuse interstitial thickening and ground-glass opacities within the lungs may reflect edema. Upper Abdomen: Aortic atherosclerosis. Bilateral renal cysts. No hydronephrosis. Scattered colonic diverticula in the visualized transverse colon and descending colon. No acute findings. Musculoskeletal: Chest wall soft tissues are unremarkable. No acute bony abnormality. IMPRESSION: Cardiomegaly. Fused interstitial thickening and ground-glass opacities may reflect mild interstitial edema. Small bilateral pleural effusions. Findings likely superimposed on chronic lung disease/fibrosis. Coronary artery disease. Aortic Atherosclerosis (ICD10-I70.0). Electronically Signed   By: Rolm Baptise M.D.   On:  12/21/2019 17:15   DG Chest Port 1 View  Result Date: 12/21/2019 CLINICAL DATA:  Aspiration, shortness of breath. EXAM: PORTABLE CHEST 1 VIEW COMPARISON:  Prior chest radiographs 11/14/2019 and earlier. FINDINGS: Redemonstrated left chest dual lead implantable cardiac device. Unchanged cardiomegaly. Aortic atherosclerosis. Bilateral airspace opacities, greatest within the right mid lung and right lung base. No sizable pleural effusion or evidence of pneumothorax. No acute bony abnormality is identified. Advanced degenerative changes of the left glenohumeral joint. IMPRESSION: Bilateral airspace opacities greatest within the right mid lung and right lung base. Findings may reflect pneumonia and/or edema. Unchanged cardiomegaly. Aortic Atherosclerosis (ICD10-I70.0). Electronically Signed   By: Kellie Simmering DO   On: 12/21/2019 10:31   DG Swallowing Func-Speech Pathology  Result Date: 12/22/2019 Objective Swallowing Evaluation: Type of Study: MBS-Modified Barium Swallow Study  Patient Details Name: ADALEY KIENE MRN: [id number] Date of Birth: [birth year] Today's Date: 12/22/2019 Time: SLP Start Time (ACUTE ONLY): 1305 -SLP Stop Time (ACUTE ONLY): 1325 SLP Time Calculation (min) (ACUTE ONLY): 20 min Past Medical History: Past Medical History: Diagnosis Date . Arthritis  . Atrial fibrillation ([place])  . Blood transfusion  . Chronic kidney disease   stage 3 renal disease . Diverticular disease  . DJD (degenerative joint disease)  . DJD (degenerative joint disease)  . DVT (deep venous thrombosis) ([place]) early 1980's  left medial upper thigh . Fracture, shoulder ~ 2003; 1997  left; right . GERD (gastroesophageal reflux disease)  . High cholesterol  . High cholesterol  . Hypertension  . Overactive bladder  . Pacemaker  . Skin cancer  of face  . Skin cancer of face  . Syncope secondary to post termination pauses 05/03/11  "passed out; first time ever" . Urinary frequency  Past Surgical History: Past Surgical History:  Procedure Laterality Date . APPENDECTOMY  ~ 1938 . BLADDER SUSPENSION  1985 . CARPAL TUNNEL RELEASE  04/18/2011  right; Procedure: CARPAL TUNNEL RELEASE;  Surgeon: Cammie Sickle., MD;  Location: [facility];  Service: Orthopedics;  Laterality: Right; . CARPAL TUNNEL RELEASE  12/05/2011  Procedure: CARPAL TUNNEL RELEASE;  Surgeon: Cammie Sickle., MD;  Location: [facility];  Service: Orthopedics;  Laterality: Left; . CATARACT EXTRACTION W/ INTRAOCULAR LENS  IMPLANT, BILATERAL   . HERNIA REPAIR  1986  "stomach" . JOINT REPLACEMENT    hips . PERMANENT PACEMAKER INSERTION N/A 05/06/2011  Procedure: PERMANENT PACEMAKER INSERTION;  Surgeon: Deboraha Sprang, MD;  Location: [facility] CATH LAB;  Service: Cardiovascular;  Laterality: N/A; . SKIN CANCER EXCISION  2012  "twice" . TEMPORARY PACEMAKER INSERTION N/A 05/05/2011  Procedure: TEMPORARY PACEMAKER INSERTION;  Surgeon: Clent Demark, MD;  Location: [facility] CATH LAB;  Service: Cardiovascular;  Laterality: N/A; . TOTAL HIP ARTHROPLASTY    "2 on right; 1 on left" HPI: Pt is a 86 y.o. female with medical history significant of chronic diastolic CHF, LVEF 55 to 86% to third-degree diastolic dysfunction 57/8469, sick sinus syndrome status post PPM, chronic A. fib not on anticoagulation, HTN, CKD stage III, hearing impairment, who presented with increasing shortness of breath. Son reported patient has had " aspiration pneumonia 2 days ago". Son reported patient has had " aspiration pneumonia 2 days ago". CT chest: Cardiomegaly. Fused interstitial thickening and ground-glass opacities may reflect mild interstitial edema.  No data recorded Assessment / Plan / Recommendation CHL IP CLINICAL IMPRESSIONS 12/22/2019 Clinical Impression Pt presented with pharyngeal dysphagia characterized by reduced lingual retraction, a pharyngeal delay, and reduced anterior laryngeal movement. She demonstrated vallecular residue, pyriform sinus residue, penetration (PAS 3) and  silent aspiration (PAS 8) of thin liquids, and nectar thick liquids via cup and straw. Pt's cough was weak and prompted coughing was ineffective in expelling penetrated/aspirated material. Amount of residue increased with advancement of solid consistencies. Postural modifications were attempted, but pt was unable to consistently demonstrate these. A dysphagia 2 diet with honey thick liquids is recommended at this time. SLP will follow for dysphagia treatment.  SLP Visit Diagnosis Dysphagia, pharyngeal phase (R13.13) Attention and concentration deficit following -- Frontal lobe and executive function deficit following -- Impact on safety and function Moderate aspiration risk;Mild aspiration risk   CHL IP TREATMENT RECOMMENDATION 12/22/2019 Treatment Recommendations Therapy as outlined in treatment plan below   Prognosis 12/22/2019 Prognosis for Safe Diet Advancement Fair Barriers to Reach Goals Severity of deficits Barriers/Prognosis Comment -- CHL IP DIET RECOMMENDATION 12/22/2019 SLP Diet Recommendations Dysphagia 2 (Fine chop) solids;Honey thick liquids Liquid Administration via Cup;Straw Medication Administration Crushed with puree Compensations Slow rate;Small sips/bites Postural Changes Remain semi-upright after after feeds/meals (Comment);Seated upright at 90 degrees   CHL IP OTHER RECOMMENDATIONS 12/22/2019 Recommended Consults -- Oral Care Recommendations Oral care BID Other Recommendations --   CHL IP FOLLOW UP RECOMMENDATIONS 12/22/2019 Follow up Recommendations Skilled Nursing facility   [facility] IP FREQUENCY AND DURATION 12/22/2019 Speech Therapy Frequency (ACUTE ONLY) min 2x/week Treatment Duration 2 weeks      CHL IP ORAL PHASE 12/22/2019 Oral Phase WFL Oral - Pudding Teaspoon -- Oral - Pudding Cup -- Oral - Honey Teaspoon -- Oral - Honey  Cup -- Oral - Nectar Teaspoon -- Oral - Nectar Cup -- Oral - Nectar Straw -- Oral - Thin Teaspoon -- Oral - Thin Cup -- Oral - Thin Straw -- Oral - Puree -- Oral - Mech  Soft -- Oral - Regular -- Oral - Multi-Consistency -- Oral - Pill -- Oral Phase - Comment --  CHL IP PHARYNGEAL PHASE 12/22/2019 Pharyngeal Phase Impaired Pharyngeal- Pudding Teaspoon -- Pharyngeal -- Pharyngeal- Pudding Cup -- Pharyngeal -- Pharyngeal- Honey Teaspoon -- Pharyngeal -- Pharyngeal- Honey Cup Pharyngeal residue - valleculae;Pharyngeal residue - pyriform;Reduced tongue base retraction;Reduced anterior laryngeal mobility Pharyngeal -- Pharyngeal- Nectar Teaspoon -- Pharyngeal -- Pharyngeal- Nectar Cup Pharyngeal residue - valleculae;Pharyngeal residue - pyriform;Reduced tongue base retraction;Reduced anterior laryngeal mobility;Penetration/Aspiration before swallow;Penetration/Aspiration during swallow Pharyngeal Material enters airway, remains ABOVE vocal cords and not ejected out;Material enters airway, passes BELOW cords without attempt by patient to eject out (silent aspiration) Pharyngeal- Nectar Straw Pharyngeal residue - valleculae;Pharyngeal residue - pyriform;Reduced tongue base retraction;Reduced anterior laryngeal mobility;Penetration/Aspiration during swallow Pharyngeal Material enters airway, passes BELOW cords without attempt by patient to eject out (silent aspiration) Pharyngeal- Thin Teaspoon -- Pharyngeal -- Pharyngeal- Thin Cup Pharyngeal residue - valleculae;Pharyngeal residue - pyriform;Reduced tongue base retraction;Reduced anterior laryngeal mobility;Penetration/Aspiration during swallow Pharyngeal Material enters airway, passes BELOW cords without attempt by patient to eject out (silent aspiration);Material enters airway, remains ABOVE vocal cords and not ejected out Pharyngeal- Thin Straw Pharyngeal residue - valleculae;Pharyngeal residue - pyriform;Reduced tongue base retraction;Reduced anterior laryngeal mobility;Penetration/Aspiration during swallow Pharyngeal Material enters airway, remains ABOVE vocal cords and not ejected out;Material enters airway, passes BELOW cords  without attempt by patient to eject out (silent aspiration) Pharyngeal- Puree Pharyngeal residue - valleculae;Pharyngeal residue - pyriform;Reduced tongue base retraction;Reduced anterior laryngeal mobility Pharyngeal -- Pharyngeal- Mechanical Soft Pharyngeal residue - valleculae;Pharyngeal residue - pyriform;Reduced tongue base retraction;Reduced anterior laryngeal mobility Pharyngeal -- Pharyngeal- Regular Pharyngeal residue - valleculae;Pharyngeal residue - pyriform;Reduced tongue base retraction;Reduced anterior laryngeal mobility Pharyngeal -- Pharyngeal- Multi-consistency -- Pharyngeal -- Pharyngeal- Pill Pharyngeal residue - valleculae;Pharyngeal residue - pyriform;Reduced tongue base retraction;Reduced anterior laryngeal mobility Pharyngeal -- Pharyngeal Comment --  ,Shanika I. Hardin Negus, [place], [place] Office number [phone] Pager [place] 12/22/2019, 4:46 PM                Time coordinating discharge: Over 30 minutes    Dwyane Dee, MD  [facility] 12/26/2019, 10:55 AM

## 2019-12-26 NOTE — Consult Note (Signed)
   [facility] Inpatient Consult   44/31/5400  [place] 86/76/1948 [id number]    [place] [facility] [ACO] Patient:  Medicare NextGen  Patient was previously followed by [facility] RN PAC.   Plan:  Will alert [place] [facility] RN PAC with Medicare NextGen ACO transitioning to a [facility] affiliated facility for [place] rehab..  For questions, please contact:   Natividad Brood, RN BSN [facility] Liaison  [phone] business mobile phone Toll free office [phone]  Fax number: [phone] [email or web address]

## 2019-12-26 NOTE — Care Management Important Message (Signed)
Important Message  Patient Details  Name: Julie Mcpherson MRN: [id number] Date of Birth: 10-09-1913   Medicare Important Message Given:  Yes     Shelda Altes 12/26/2019, 10:55 AM

## 2019-12-26 NOTE — Progress Notes (Signed)
Report given to RN at [facility]

## 2019-12-28 DIAGNOSIS — J690 Pneumonitis due to inhalation of food and vomit: Secondary | ICD-10-CM | POA: Diagnosis not present

## 2019-12-28 DIAGNOSIS — L89629 Pressure ulcer of left heel, unspecified stage: Secondary | ICD-10-CM | POA: Diagnosis not present

## 2019-12-28 DIAGNOSIS — N3281 Overactive bladder: Secondary | ICD-10-CM | POA: Diagnosis not present

## 2019-12-28 DIAGNOSIS — I5042 Chronic combined systolic (congestive) and diastolic (congestive) heart failure: Secondary | ICD-10-CM | POA: Diagnosis not present

## 2019-12-28 DIAGNOSIS — I10 Essential (primary) hypertension: Secondary | ICD-10-CM | POA: Diagnosis not present

## 2019-12-28 DIAGNOSIS — I482 Chronic atrial fibrillation, unspecified: Secondary | ICD-10-CM | POA: Diagnosis not present

## 2019-12-28 DIAGNOSIS — E559 Vitamin D deficiency, unspecified: Secondary | ICD-10-CM | POA: Diagnosis not present

## 2019-12-28 DIAGNOSIS — M6281 Muscle weakness (generalized): Secondary | ICD-10-CM | POA: Diagnosis not present

## 2019-12-28 DIAGNOSIS — K5900 Constipation, unspecified: Secondary | ICD-10-CM | POA: Diagnosis not present

## 2019-12-28 DIAGNOSIS — G619 Inflammatory polyneuropathy, unspecified: Secondary | ICD-10-CM | POA: Diagnosis not present

## 2019-12-28 DIAGNOSIS — N182 Chronic kidney disease, stage 2 (mild): Secondary | ICD-10-CM | POA: Diagnosis not present

## 2020-01-04 DIAGNOSIS — I10 Essential (primary) hypertension: Secondary | ICD-10-CM | POA: Diagnosis not present

## 2020-01-04 DIAGNOSIS — N182 Chronic kidney disease, stage 2 (mild): Secondary | ICD-10-CM | POA: Diagnosis not present

## 2020-01-04 DIAGNOSIS — R1310 Dysphagia, unspecified: Secondary | ICD-10-CM | POA: Diagnosis not present

## 2020-01-04 DIAGNOSIS — R54 Age-related physical debility: Secondary | ICD-10-CM | POA: Diagnosis not present

## 2020-01-04 DIAGNOSIS — E440 Moderate protein-calorie malnutrition: Secondary | ICD-10-CM | POA: Diagnosis not present

## 2020-01-04 DIAGNOSIS — L89629 Pressure ulcer of left heel, unspecified stage: Secondary | ICD-10-CM | POA: Diagnosis not present

## 2020-01-04 DIAGNOSIS — M6281 Muscle weakness (generalized): Secondary | ICD-10-CM | POA: Diagnosis not present

## 2020-01-04 DIAGNOSIS — M810 Age-related osteoporosis without current pathological fracture: Secondary | ICD-10-CM | POA: Diagnosis not present

## 2020-01-04 DIAGNOSIS — J9601 Acute respiratory failure with hypoxia: Secondary | ICD-10-CM | POA: Diagnosis not present

## 2020-01-04 DIAGNOSIS — E559 Vitamin D deficiency, unspecified: Secondary | ICD-10-CM | POA: Diagnosis not present

## 2020-01-04 DIAGNOSIS — I482 Chronic atrial fibrillation, unspecified: Secondary | ICD-10-CM | POA: Diagnosis not present

## 2020-01-04 DIAGNOSIS — J690 Pneumonitis due to inhalation of food and vomit: Secondary | ICD-10-CM | POA: Diagnosis not present

## 2020-01-05 DIAGNOSIS — I509 Heart failure, unspecified: Secondary | ICD-10-CM | POA: Diagnosis not present

## 2020-01-05 DIAGNOSIS — H109 Unspecified conjunctivitis: Secondary | ICD-10-CM | POA: Diagnosis not present

## 2020-01-05 DIAGNOSIS — L8996 Pressure-induced deep tissue damage of unspecified site: Secondary | ICD-10-CM | POA: Diagnosis not present

## 2020-01-05 DIAGNOSIS — E440 Moderate protein-calorie malnutrition: Secondary | ICD-10-CM | POA: Diagnosis not present

## 2020-01-05 DIAGNOSIS — H04129 Dry eye syndrome of unspecified lacrimal gland: Secondary | ICD-10-CM | POA: Diagnosis not present

## 2020-01-05 DIAGNOSIS — F330 Major depressive disorder, recurrent, mild: Secondary | ICD-10-CM | POA: Diagnosis not present

## 2020-01-05 DIAGNOSIS — K5900 Constipation, unspecified: Secondary | ICD-10-CM | POA: Diagnosis not present

## 2020-01-05 DIAGNOSIS — A498 Other bacterial infections of unspecified site: Secondary | ICD-10-CM | POA: Diagnosis not present

## 2020-01-06 DIAGNOSIS — A498 Other bacterial infections of unspecified site: Secondary | ICD-10-CM | POA: Diagnosis not present

## 2020-01-06 DIAGNOSIS — H109 Unspecified conjunctivitis: Secondary | ICD-10-CM | POA: Diagnosis not present

## 2020-01-06 DIAGNOSIS — E440 Moderate protein-calorie malnutrition: Secondary | ICD-10-CM | POA: Diagnosis not present

## 2020-01-06 DIAGNOSIS — L8996 Pressure-induced deep tissue damage of unspecified site: Secondary | ICD-10-CM | POA: Diagnosis not present

## 2020-01-06 DIAGNOSIS — K5900 Constipation, unspecified: Secondary | ICD-10-CM | POA: Diagnosis not present

## 2020-01-06 DIAGNOSIS — I509 Heart failure, unspecified: Secondary | ICD-10-CM | POA: Diagnosis not present

## 2020-01-10 DIAGNOSIS — H109 Unspecified conjunctivitis: Secondary | ICD-10-CM | POA: Diagnosis not present

## 2020-01-10 DIAGNOSIS — A498 Other bacterial infections of unspecified site: Secondary | ICD-10-CM | POA: Diagnosis not present

## 2020-01-10 DIAGNOSIS — I509 Heart failure, unspecified: Secondary | ICD-10-CM | POA: Diagnosis not present

## 2020-01-10 DIAGNOSIS — K5900 Constipation, unspecified: Secondary | ICD-10-CM | POA: Diagnosis not present

## 2020-01-10 DIAGNOSIS — L8996 Pressure-induced deep tissue damage of unspecified site: Secondary | ICD-10-CM | POA: Diagnosis not present

## 2020-01-10 DIAGNOSIS — E440 Moderate protein-calorie malnutrition: Secondary | ICD-10-CM | POA: Diagnosis not present

## 2020-01-11 DIAGNOSIS — F4323 Adjustment disorder with mixed anxiety and depressed mood: Secondary | ICD-10-CM | POA: Diagnosis not present

## 2020-01-11 DIAGNOSIS — I509 Heart failure, unspecified: Secondary | ICD-10-CM | POA: Diagnosis not present

## 2020-01-11 DIAGNOSIS — J9601 Acute respiratory failure with hypoxia: Secondary | ICD-10-CM | POA: Diagnosis not present

## 2020-01-11 DIAGNOSIS — E440 Moderate protein-calorie malnutrition: Secondary | ICD-10-CM | POA: Diagnosis not present

## 2020-01-11 DIAGNOSIS — M6281 Muscle weakness (generalized): Secondary | ICD-10-CM | POA: Diagnosis not present

## 2020-01-11 DIAGNOSIS — A498 Other bacterial infections of unspecified site: Secondary | ICD-10-CM | POA: Diagnosis not present

## 2020-01-11 DIAGNOSIS — K5900 Constipation, unspecified: Secondary | ICD-10-CM | POA: Diagnosis not present

## 2020-01-11 DIAGNOSIS — H109 Unspecified conjunctivitis: Secondary | ICD-10-CM | POA: Diagnosis not present

## 2020-01-11 DIAGNOSIS — J690 Pneumonitis due to inhalation of food and vomit: Secondary | ICD-10-CM | POA: Diagnosis not present

## 2020-01-11 DIAGNOSIS — L89629 Pressure ulcer of left heel, unspecified stage: Secondary | ICD-10-CM | POA: Diagnosis not present

## 2020-01-11 DIAGNOSIS — R1310 Dysphagia, unspecified: Secondary | ICD-10-CM | POA: Diagnosis not present

## 2020-01-11 DIAGNOSIS — N182 Chronic kidney disease, stage 2 (mild): Secondary | ICD-10-CM | POA: Diagnosis not present

## 2020-01-11 DIAGNOSIS — I10 Essential (primary) hypertension: Secondary | ICD-10-CM | POA: Diagnosis not present

## 2020-01-11 DIAGNOSIS — R54 Age-related physical debility: Secondary | ICD-10-CM | POA: Diagnosis not present

## 2020-01-11 DIAGNOSIS — L8996 Pressure-induced deep tissue damage of unspecified site: Secondary | ICD-10-CM | POA: Diagnosis not present

## 2020-01-11 DIAGNOSIS — E559 Vitamin D deficiency, unspecified: Secondary | ICD-10-CM | POA: Diagnosis not present

## 2020-01-11 DIAGNOSIS — M810 Age-related osteoporosis without current pathological fracture: Secondary | ICD-10-CM | POA: Diagnosis not present

## 2020-01-11 DIAGNOSIS — I482 Chronic atrial fibrillation, unspecified: Secondary | ICD-10-CM | POA: Diagnosis not present

## 2020-01-12 DIAGNOSIS — A498 Other bacterial infections of unspecified site: Secondary | ICD-10-CM | POA: Diagnosis not present

## 2020-01-12 DIAGNOSIS — K5900 Constipation, unspecified: Secondary | ICD-10-CM | POA: Diagnosis not present

## 2020-01-12 DIAGNOSIS — H109 Unspecified conjunctivitis: Secondary | ICD-10-CM | POA: Diagnosis not present

## 2020-01-12 DIAGNOSIS — L8996 Pressure-induced deep tissue damage of unspecified site: Secondary | ICD-10-CM | POA: Diagnosis not present

## 2020-01-12 DIAGNOSIS — I509 Heart failure, unspecified: Secondary | ICD-10-CM | POA: Diagnosis not present

## 2020-01-12 DIAGNOSIS — E440 Moderate protein-calorie malnutrition: Secondary | ICD-10-CM | POA: Diagnosis not present

## 2020-01-23 DIAGNOSIS — F4323 Adjustment disorder with mixed anxiety and depressed mood: Secondary | ICD-10-CM | POA: Diagnosis not present

## 2020-01-25 DIAGNOSIS — F4323 Adjustment disorder with mixed anxiety and depressed mood: Secondary | ICD-10-CM | POA: Diagnosis not present

## 2020-02-02 ENCOUNTER — Ambulatory Visit (INDEPENDENT_AMBULATORY_CARE_PROVIDER_SITE_OTHER): Payer: Medicare Other

## 2020-02-02 DIAGNOSIS — I495 Sick sinus syndrome: Secondary | ICD-10-CM | POA: Diagnosis not present

## 2020-02-03 LAB — CUP PACEART REMOTE DEVICE CHECK
Battery Remaining Longevity: 26 mo
Battery Remaining Percentage: 22 %
Battery Voltage: 2.78 V
Brady Statistic AP VP Percent: 1 %
Brady Statistic AP VS Percent: 88 %
Brady Statistic AS VP Percent: 1 %
Brady Statistic AS VS Percent: 11 %
Brady Statistic RA Percent Paced: 74 %
Brady Statistic RV Percent Paced: 12 %
Date Time Interrogation Session: 20211230213332
Implantable Lead Implant Date: 20130402
Implantable Lead Implant Date: 20130402
Implantable Lead Location: 753859
Implantable Lead Location: 753860
Implantable Lead Model: 1948
Implantable Pulse Generator Implant Date: 20130402
Lead Channel Impedance Value: 350 Ohm
Lead Channel Impedance Value: 650 Ohm
Lead Channel Pacing Threshold Amplitude: 0.625 V
Lead Channel Pacing Threshold Amplitude: 0.75 V
Lead Channel Pacing Threshold Pulse Width: 0.5 ms
Lead Channel Pacing Threshold Pulse Width: 0.5 ms
Lead Channel Sensing Intrinsic Amplitude: 1.3 mV
Lead Channel Sensing Intrinsic Amplitude: 12 mV
Lead Channel Setting Pacing Amplitude: 1.625
Lead Channel Setting Pacing Amplitude: 2.5 V
Lead Channel Setting Pacing Pulse Width: 0.5 ms
Lead Channel Setting Sensing Sensitivity: 2 mV
Pulse Gen Model: 2110
Pulse Gen Serial Number: 7317017

## 2020-02-08 DIAGNOSIS — F4323 Adjustment disorder with mixed anxiety and depressed mood: Secondary | ICD-10-CM | POA: Diagnosis not present

## 2020-02-13 DIAGNOSIS — M6281 Muscle weakness (generalized): Secondary | ICD-10-CM | POA: Diagnosis not present

## 2020-02-13 DIAGNOSIS — I5033 Acute on chronic diastolic (congestive) heart failure: Secondary | ICD-10-CM | POA: Diagnosis not present

## 2020-02-13 DIAGNOSIS — I482 Chronic atrial fibrillation, unspecified: Secondary | ICD-10-CM | POA: Diagnosis not present

## 2020-02-13 DIAGNOSIS — I10 Essential (primary) hypertension: Secondary | ICD-10-CM | POA: Diagnosis not present

## 2020-02-13 DIAGNOSIS — R54 Age-related physical debility: Secondary | ICD-10-CM | POA: Diagnosis not present

## 2020-02-13 DIAGNOSIS — R1310 Dysphagia, unspecified: Secondary | ICD-10-CM | POA: Diagnosis not present

## 2020-02-13 DIAGNOSIS — L89629 Pressure ulcer of left heel, unspecified stage: Secondary | ICD-10-CM | POA: Diagnosis not present

## 2020-02-15 ENCOUNTER — Telehealth: Payer: Self-pay

## 2020-02-15 NOTE — Telephone Encounter (Signed)
-----   Message from Deboraha Sprang, MD sent at 02/15/2020  8:54 AM EST ----- Remote reviewed. This remote is abnormal for persistent afib  Could you do me a favor please, M, and call patient and ask how she is doing in her afib; then we have to have a discussion re anticoagulation and that we can do or can do in Afib clinic .  Thx  SK

## 2020-02-15 NOTE — Telephone Encounter (Signed)
Spoke with pt who reports no symptoms with AFIB.  Pt denies CP, SOB or dizziness.  Pt states she is doing well.  Pt would appreciate a virtual visit with Dr Caryl Comes d/t the cost of transportation.  Pt advised will request Dr Olin Pia scheduler contact her to schedule virtual visit.  Reviewed ED precautions with pt.  Pt verbalizes understanding and thanked Therapist, sports for the call.

## 2020-02-16 NOTE — Progress Notes (Signed)
Remote pacemaker transmission.   

## 2020-02-23 ENCOUNTER — Other Ambulatory Visit: Payer: Self-pay

## 2020-02-23 ENCOUNTER — Telehealth: Payer: Self-pay

## 2020-02-23 ENCOUNTER — Telehealth (INDEPENDENT_AMBULATORY_CARE_PROVIDER_SITE_OTHER): Payer: Medicare Other | Admitting: Internal Medicine

## 2020-02-23 VITALS — BP 139/80 | HR 78 | Temp 97.8°F | Resp 18 | Wt 117.8 lb

## 2020-02-23 DIAGNOSIS — Z79899 Other long term (current) drug therapy: Secondary | ICD-10-CM

## 2020-02-23 DIAGNOSIS — I495 Sick sinus syndrome: Secondary | ICD-10-CM | POA: Diagnosis not present

## 2020-02-23 DIAGNOSIS — Z950 Presence of cardiac pacemaker: Secondary | ICD-10-CM | POA: Diagnosis not present

## 2020-02-23 DIAGNOSIS — I480 Paroxysmal atrial fibrillation: Secondary | ICD-10-CM

## 2020-02-23 MED ORDER — APIXABAN 2.5 MG PO TABS: 2.5000 mg | ORAL_TABLET | Freq: Two times a day (BID) | ORAL | 3 refills | Status: AC

## 2020-02-23 NOTE — Patient Instructions (Addendum)
Medication Instructions:  Your physician has recommended you make the following change in your medication:   ** Begin Eliquis 2.5mg  - 1 tablet by mouth twice daily. *If you need a refill on your cardiac medications before your next appointment, please call your pharmacy*   Lab Work: CBC in 6 months - order has been placed.  Please call and schedule appointment.  If you have labs (blood work) drawn today and your tests are completely normal, you will receive your results only by: [name] MyChart Message (if you have MyChart) OR . A paper copy in the mail If you have any lab test that is abnormal or we need to change your treatment, we will call you to review the results.   Testing/Procedures: None ordered.    Follow-Up: At [facility], you and your health needs are our priority.  As part of our continuing mission to provide you with exceptional heart care, we have created designated Provider Care Teams.  These Care Teams include your primary Cardiologist (physician) and Advanced Practice Providers (APPs -  Physician Assistants and Nurse Practitioners) who all work together to provide you with the care you need, when you need it.  We recommend signing up for the patient portal called "MyChart".  Sign up information is provided on this After Visit Summary.  MyChart is used to connect with patients for Virtual Visits (Telemedicine).  Patients are able to view lab/test results, encounter notes, upcoming appointments, etc.  Non-urgent messages can be sent to your provider as well.   To learn more about what you can do with MyChart, go to [email or web address].    Your next appointment:   12 month(s)  The format for your next appointment:   Virtual Visit   Provider:   Virl Axe, MD

## 2020-02-23 NOTE — Progress Notes (Signed)
[name]     Electrophysiology TeleHealth Note   Due to national recommendations of social distancing due to COVID 19, an audio/video telehealth visit is felt to be most appropriate for this patient at this time.  See MyChart message from today for the patient's consent to telehealth for [facility].   Date:  9/56/3875   ID:  Julie, Mcpherson March 08, 1913, MRN [id number]  Location: patient's home  Provider location: [address] [place]  Evaluation Performed: Follow-up visit  PCP:  Lajean Manes, MD  Cardiologist:   [facility] Electrophysiologist:  SK   Chief Complaint: tachybrady  History of Present Illness:    Julie Mcpherson is a 87 y.o. female who presents via audio/video conferencing for a telehealth visit today.  Since last being seen in our clinic for tachybrady syndrome with St Jude pacemaker,her device interrogation demonstrates persistent afib No clearly attributable symptoms, without palpitations, no clear worsening of SOB   Date Cr K Hgb  9/20 1.38 5.0    11/21 1.687 4.1 84.1   Thromboembolic risk factors ( age -43, HTN-1, Gender-1) for a CHADSVASc Score of >=4   The patient denies symptoms of fevers, chills, cough, or new SOB worrisome for COVID 19.    Past Medical History:  Diagnosis Date  . Arthritis   . Atrial fibrillation ([place])   . Blood transfusion   . Chronic kidney disease    stage 3 renal disease  . Diverticular disease   . DJD (degenerative joint disease)   . DJD (degenerative joint disease)   . DVT (deep venous thrombosis) ([place]) early 1980's   left medial upper thigh  . Fracture, shoulder ~ 2003; 1997   left; right  . GERD (gastroesophageal reflux disease)   . High cholesterol   . High cholesterol   . Hypertension   . Overactive bladder   . Pacemaker   . Skin cancer of face   . Skin cancer of face   . Syncope secondary to post termination pauses 05/03/11   "passed out; first time ever"  . Urinary frequency     Past  Surgical History:  Procedure Laterality Date  . APPENDECTOMY  ~ 1938  . BLADDER SUSPENSION  1985  . CARPAL TUNNEL RELEASE  04/18/2011   right; Procedure: CARPAL TUNNEL RELEASE;  Surgeon: Cammie Sickle., MD;  Location: [facility];  Service: Orthopedics;  Laterality: Right;  . CARPAL TUNNEL RELEASE  12/05/2011   Procedure: CARPAL TUNNEL RELEASE;  Surgeon: Cammie Sickle., MD;  Location: [facility];  Service: Orthopedics;  Laterality: Left;  . CATARACT EXTRACTION W/ INTRAOCULAR LENS  IMPLANT, BILATERAL    . HERNIA REPAIR  1986   "stomach"  . JOINT REPLACEMENT     hips  . PERMANENT PACEMAKER INSERTION N/A 05/06/2011   Procedure: PERMANENT PACEMAKER INSERTION;  Surgeon: Deboraha Sprang, MD;  Location: [facility] CATH LAB;  Service: Cardiovascular;  Laterality: N/A;  . SKIN CANCER EXCISION  2012   "twice"  . TEMPORARY PACEMAKER INSERTION N/A 05/05/2011   Procedure: TEMPORARY PACEMAKER INSERTION;  Surgeon: Clent Demark, MD;  Location: [facility] CATH LAB;  Service: Cardiovascular;  Laterality: N/A;  . TOTAL HIP ARTHROPLASTY     "2 on right; 1 on left"    Current Outpatient Medications  Medication Sig Dispense Refill  . acetaminophen (TYLENOL) 500 MG tablet Take 500 mg by mouth 2 (two) times daily.    [name] amLODipine (NORVASC) 2.5 MG tablet Take 2.5 mg  by mouth daily.    . calcium carbonate (OS-CAL) 600 MG TABS Take 600 mg by mouth daily with breakfast.     . cholecalciferol (VITAMIN D) 1000 UNITS tablet Take 1,000 Units by mouth daily.    [name] gabapentin (NEURONTIN) 100 MG capsule Take 1 capsule (100 mg total) by mouth 3 (three) times daily. 90 capsule 0  . magnesium hydroxide (MILK OF MAGNESIA) 400 MG/5ML suspension Take 30 mLs by mouth daily as needed for mild constipation (x 48 hours,if no relief,add miralax by mouth daily for 3 days.).    [name] metoprolol succinate (TOPROL-XL) 25 MG 24 hr tablet Take 1 tablet (25 mg total) by mouth daily. 30 tablet 0  . Multiple Vitamin  (MULTIVITAMIN WITH MINERALS) TABS Take 1 tablet by mouth daily.    [name] MYRBETRIQ 50 MG TB24 tablet Take 1 tablet (50 mg total) by mouth daily. (Patient taking differently: Take 50 mg by mouth at bedtime. ) 30 tablet 0  . OVER THE COUNTER MEDICATION Take 120 mLs by mouth in the morning and at bedtime. Med pass 2.0    . polyethylene glycol (MIRALAX / GLYCOLAX) 17 g packet Take 17 g by mouth daily as needed for mild constipation (for 3 days,if no relief by milk of mag).     No current facility-administered medications for this visit.    Allergies:   Codeine, Statins, Sulfa antibiotics, Other, and Tetanus toxoids   Social History:  The patient  reports that she has never smoked. She has never used smokeless tobacco. She reports current alcohol use. She reports that she does not use drugs.   Family History:  The patient's   Family history is unknown by patient.   ROS:  Please see the history of present illness.   All other systems are personally reviewed and negative.    Exam:    Vital Signs:  There were no vitals taken for this visit.    Labs/Other Tests and Data Reviewed:    Recent Labs: 11/14/2019: ALT 16 11/15/2019: TSH 1.913 12/21/2019: B Natriuretic Peptide 798.3; Hemoglobin 12.1; Platelets 192 12/26/2019: BUN 53; Creatinine, Ser 1.67; Potassium 4.3; Sodium 142   Wt Readings from Last 3 Encounters:  12/26/19 114 lb 13.8 oz (52.1 kg)  11/24/19 109 lb (49.4 kg)  11/21/19 109 lb (49.4 kg)     Other studies personally reviewed: Additional studies/ records that were reviewed today include:   The patient presents wearable device technology report for my review today.    Last device remote is reviewed from [facility] PDF dated 12/21  which reveals normal device function,   arrhythmias - persistent atrial   ASSESSMENT & PLAN:   Atrial fibrillation with posttermination pauses  Pacemaker-St. Jude    Hypertension  Right bundle branch block   High Risk Medication Surveillance  aldactone    Atrial fibrillation persistent   No attributable symptoms but discussion regarding anticoagulation-- her comment was let it come and take me out, I have been here longe enough at 89 years, but the concern to her was that not all Afib strokes are fatal and mamny are significantly disabling so if therte were a medicine to reduce the risk she would be willing to take it.  Have reviewed bleeding risk   WIth her Ct > 1.5 and age we will begin Apixoban  2.5 bid with repeat CBC in about 6 months, but they will monitor her for bleeding   K appropriate given her aldactone       COVID  19 screen The patient denies symptoms of COVID 19 at this time.  The importance of social distancing was discussed today.  Follow-up:  18m Remote As Scheduled     Current medicines are reviewed at length with the patient today.   The patient does not have concerns regarding her medicines.  The following changes were made today:  Begin Apixoban  2.5 bid  Have given a verbal order to the nurse (ANN)  Labs/ tests ordered today include:   No orders of the defined types were placed in this encounter.   Future tests ( post COVID )     Patient Risk:  after full review of this patients clinical status, I feel that they are at moderate  risk at this time.  Today, I have spent 10 minutes with the patient with telehealth technology discussing the above.   Future tests ( post COVID )         [address]  [place] [address] [phone] (office) [phone] (fax)

## 2020-02-23 NOTE — Telephone Encounter (Signed)
  Patient Consent for Virtual Visit         Julie Mcpherson has provided verbal consent on 02/23/2020 for a virtual visit (video or telephone).   CONSENT FOR VIRTUAL VISIT FOR:  Julie Mcpherson  By participating in this virtual visit I agree to the following:  I hereby voluntarily request, consent and authorize [facility] and its employed or contracted physicians, physician assistants, nurse practitioners or other licensed health care professionals (the Practitioner), to provide me with telemedicine health care services (the "Services") as deemed necessary by the treating Practitioner. I acknowledge and consent to receive the Services by the Practitioner via telemedicine. I understand that the telemedicine visit will involve communicating with the Practitioner through live audiovisual communication technology and the disclosure of certain medical information by electronic transmission. I acknowledge that I have been given the opportunity to request an in-person assessment or other available alternative prior to the telemedicine visit and am voluntarily participating in the telemedicine visit.  I understand that I have the right to withhold or withdraw my consent to the use of telemedicine in the course of my care at any time, without affecting my right to future care or treatment, and that the Practitioner or I may terminate the telemedicine visit at any time. I understand that I have the right to inspect all information obtained and/or recorded in the course of the telemedicine visit and may receive copies of available information for a reasonable fee.  I understand that some of the potential risks of receiving the Services via telemedicine include:  [name] Delay or interruption in medical evaluation due to technological equipment failure or disruption; . Information transmitted may not be sufficient (e.g. poor resolution of images) to allow for appropriate medical decision making by the  Practitioner; and/or  . In rare instances, security protocols could fail, causing a breach of personal health information.  Furthermore, I acknowledge that it is my responsibility to provide information about my medical history, conditions and care that is complete and accurate to the best of my ability. I acknowledge that Practitioner's advice, recommendations, and/or decision may be based on factors not within their control, such as incomplete or inaccurate data provided by me or distortions of diagnostic images or specimens that may result from electronic transmissions. I understand that the practice of medicine is not an exact science and that Practitioner makes no warranties or guarantees regarding treatment outcomes. I acknowledge that a copy of this consent can be made available to me via my patient portal ([facility]), or I can request a printed copy by calling the office of [facility].    I understand that my insurance will be billed for this visit.   I have read or had this consent read to me. . I understand the contents of this consent, which adequately explains the benefits and risks of the Services being provided via telemedicine.  . I have been provided ample opportunity to ask questions regarding this consent and the Services and have had my questions answered to my satisfaction. . I give my informed consent for the services to be provided through the use of telemedicine in my medical care

## 2020-02-23 NOTE — Addendum Note (Signed)
Addended by: Thora Lance on: 02/23/2020 05:20 PM   Modules accepted: Orders

## 2020-02-24 ENCOUNTER — Telehealth: Payer: Self-pay | Admitting: Internal Medicine

## 2020-02-24 NOTE — Telephone Encounter (Signed)
Patient's son states the patient's prescriptions do not need to be sent anywhere. He states the patient is at [facility] and they have their one pharmacy. He states her prescriptions were sent to [facility], but she does not use them. He states in the future all her prescriptions need to be given to the nurses at [facility]. He states they already have her prescriptions.

## 2020-03-07 DIAGNOSIS — F4323 Adjustment disorder with mixed anxiety and depressed mood: Secondary | ICD-10-CM | POA: Diagnosis not present

## 2020-03-12 DIAGNOSIS — F4323 Adjustment disorder with mixed anxiety and depressed mood: Secondary | ICD-10-CM | POA: Diagnosis not present

## 2020-04-02 DIAGNOSIS — F4323 Adjustment disorder with mixed anxiety and depressed mood: Secondary | ICD-10-CM | POA: Diagnosis not present

## 2020-04-04 DIAGNOSIS — F4323 Adjustment disorder with mixed anxiety and depressed mood: Secondary | ICD-10-CM | POA: Diagnosis not present

## 2020-04-09 DIAGNOSIS — J90 Pleural effusion, not elsewhere classified: Secondary | ICD-10-CM | POA: Diagnosis not present

## 2020-04-09 DIAGNOSIS — J984 Other disorders of lung: Secondary | ICD-10-CM | POA: Diagnosis not present

## 2020-05-01 DIAGNOSIS — R262 Difficulty in walking, not elsewhere classified: Secondary | ICD-10-CM | POA: Diagnosis not present

## 2020-05-01 DIAGNOSIS — M79674 Pain in right toe(s): Secondary | ICD-10-CM | POA: Diagnosis not present

## 2020-05-01 DIAGNOSIS — I739 Peripheral vascular disease, unspecified: Secondary | ICD-10-CM | POA: Diagnosis not present

## 2020-05-01 DIAGNOSIS — M2041 Other hammer toe(s) (acquired), right foot: Secondary | ICD-10-CM | POA: Diagnosis not present

## 2020-05-01 DIAGNOSIS — M2042 Other hammer toe(s) (acquired), left foot: Secondary | ICD-10-CM | POA: Diagnosis not present

## 2020-05-01 DIAGNOSIS — B351 Tinea unguium: Secondary | ICD-10-CM | POA: Diagnosis not present

## 2020-05-01 DIAGNOSIS — M79675 Pain in left toe(s): Secondary | ICD-10-CM | POA: Diagnosis not present

## 2020-05-02 DIAGNOSIS — F4323 Adjustment disorder with mixed anxiety and depressed mood: Secondary | ICD-10-CM | POA: Diagnosis not present

## 2020-05-03 ENCOUNTER — Ambulatory Visit (INDEPENDENT_AMBULATORY_CARE_PROVIDER_SITE_OTHER): Payer: Medicare Other

## 2020-05-03 DIAGNOSIS — I495 Sick sinus syndrome: Secondary | ICD-10-CM

## 2020-05-07 LAB — CUP PACEART REMOTE DEVICE CHECK
Battery Remaining Longevity: 19 mo
Battery Remaining Percentage: 17 %
Battery Voltage: 2.75 V
Brady Statistic AP VP Percent: 1 %
Brady Statistic AP VS Percent: 88 %
Brady Statistic AS VP Percent: 1 %
Brady Statistic AS VS Percent: 11 %
Brady Statistic RA Percent Paced: 62 %
Brady Statistic RV Percent Paced: 26 %
Date Time Interrogation Session: 20220402225416
Implantable Lead Implant Date: 20130402
Implantable Lead Implant Date: 20130402
Implantable Lead Location: 753859
Implantable Lead Location: 753860
Implantable Lead Model: 1948
Implantable Pulse Generator Implant Date: 20130402
Lead Channel Impedance Value: 350 Ohm
Lead Channel Impedance Value: 640 Ohm
Lead Channel Pacing Threshold Amplitude: 0.625 V
Lead Channel Pacing Threshold Amplitude: 0.75 V
Lead Channel Pacing Threshold Pulse Width: 0.5 ms
Lead Channel Pacing Threshold Pulse Width: 0.5 ms
Lead Channel Sensing Intrinsic Amplitude: 1.3 mV
Lead Channel Sensing Intrinsic Amplitude: 12 mV
Lead Channel Setting Pacing Amplitude: 1.625
Lead Channel Setting Pacing Amplitude: 2.5 V
Lead Channel Setting Pacing Pulse Width: 0.5 ms
Lead Channel Setting Sensing Sensitivity: 2 mV
Pulse Gen Model: 2110
Pulse Gen Serial Number: 7317017

## 2020-05-16 NOTE — Progress Notes (Signed)
Remote pacemaker transmission.   

## 2020-08-03 DEATH — deceased
